# Patient Record
Sex: Female | Born: 1945 | Race: White | Hispanic: No | Marital: Married | Smoking: Never smoker
Health system: Southern US, Community
[De-identification: ages and names within clinical notes are randomized; demographics above are authoritative.]

## PROBLEM LIST (undated history)

## (undated) DIAGNOSIS — E785 Hyperlipidemia, unspecified: Secondary | ICD-10-CM

## (undated) DIAGNOSIS — D509 Iron deficiency anemia, unspecified: Secondary | ICD-10-CM

## (undated) DIAGNOSIS — F329 Major depressive disorder, single episode, unspecified: Secondary | ICD-10-CM

## (undated) DIAGNOSIS — C569 Malignant neoplasm of unspecified ovary: Secondary | ICD-10-CM

## (undated) DIAGNOSIS — C55 Malignant neoplasm of uterus, part unspecified: Secondary | ICD-10-CM

## (undated) DIAGNOSIS — F32A Depression, unspecified: Secondary | ICD-10-CM

## (undated) DIAGNOSIS — M810 Age-related osteoporosis without current pathological fracture: Secondary | ICD-10-CM

## (undated) DIAGNOSIS — E559 Vitamin D deficiency, unspecified: Secondary | ICD-10-CM

## (undated) DIAGNOSIS — K648 Other hemorrhoids: Secondary | ICD-10-CM

## (undated) DIAGNOSIS — K219 Gastro-esophageal reflux disease without esophagitis: Secondary | ICD-10-CM

## (undated) DIAGNOSIS — M6289 Other specified disorders of muscle: Secondary | ICD-10-CM

## (undated) DIAGNOSIS — M109 Gout, unspecified: Secondary | ICD-10-CM

## (undated) DIAGNOSIS — IMO0002 Reserved for concepts with insufficient information to code with codable children: Secondary | ICD-10-CM

## (undated) DIAGNOSIS — R7303 Prediabetes: Secondary | ICD-10-CM

## (undated) DIAGNOSIS — C439 Malignant melanoma of skin, unspecified: Secondary | ICD-10-CM

## (undated) DIAGNOSIS — N63 Unspecified lump in unspecified breast: Secondary | ICD-10-CM

## (undated) DIAGNOSIS — R519 Headache, unspecified: Secondary | ICD-10-CM

## (undated) DIAGNOSIS — M1990 Unspecified osteoarthritis, unspecified site: Secondary | ICD-10-CM

## (undated) DIAGNOSIS — C44529 Squamous cell carcinoma of skin of other part of trunk: Secondary | ICD-10-CM

## (undated) DIAGNOSIS — G629 Polyneuropathy, unspecified: Secondary | ICD-10-CM

## (undated) DIAGNOSIS — K635 Polyp of colon: Secondary | ICD-10-CM

## (undated) DIAGNOSIS — G5600 Carpal tunnel syndrome, unspecified upper limb: Secondary | ICD-10-CM

## (undated) DIAGNOSIS — K6389 Other specified diseases of intestine: Secondary | ICD-10-CM

## (undated) DIAGNOSIS — R51 Headache: Secondary | ICD-10-CM

## (undated) DIAGNOSIS — R918 Other nonspecific abnormal finding of lung field: Secondary | ICD-10-CM

## (undated) DIAGNOSIS — M069 Rheumatoid arthritis, unspecified: Secondary | ICD-10-CM

## (undated) DIAGNOSIS — W19XXXA Unspecified fall, initial encounter: Secondary | ICD-10-CM

## (undated) DIAGNOSIS — K589 Irritable bowel syndrome without diarrhea: Secondary | ICD-10-CM

## (undated) DIAGNOSIS — I10 Essential (primary) hypertension: Secondary | ICD-10-CM

## (undated) DIAGNOSIS — N289 Disorder of kidney and ureter, unspecified: Secondary | ICD-10-CM

## (undated) HISTORY — PX: CHOLECYSTECTOMY: SHX55

## (undated) HISTORY — DX: Unspecified lump in unspecified breast: N63.0

## (undated) HISTORY — DX: Unspecified fall, initial encounter: W19.XXXA

## (undated) HISTORY — DX: Major depressive disorder, single episode, unspecified: F32.9

## (undated) HISTORY — PX: OPERATIVE HYSTEROSCOPY: SUR664

## (undated) HISTORY — PX: BREAST SURGERY: SHX581

## (undated) HISTORY — PX: ADENOIDECTOMY: SUR15

## (undated) HISTORY — PX: CARPAL TUNNEL RELEASE: SHX101

## (undated) HISTORY — DX: Polyp of colon: K63.5

## (undated) HISTORY — DX: Squamous cell carcinoma of skin of other part of trunk: C44.529

## (undated) HISTORY — DX: Age-related osteoporosis without current pathological fracture: M81.0

## (undated) HISTORY — DX: Gout, unspecified: M10.9

## (undated) HISTORY — DX: Polyneuropathy, unspecified: G62.9

## (undated) HISTORY — PX: MASTECTOMY, PARTIAL: SHX709

## (undated) HISTORY — PX: KNEE ARTHROPLASTY: SHX992

## (undated) HISTORY — DX: Carpal tunnel syndrome, unspecified upper limb: G56.00

## (undated) HISTORY — PX: APPENDECTOMY: SHX54

## (undated) HISTORY — DX: Gastro-esophageal reflux disease without esophagitis: K21.9

## (undated) HISTORY — DX: Prediabetes: R73.03

## (undated) HISTORY — DX: Other specified diseases of intestine: K63.89

## (undated) HISTORY — DX: Headache: R51

## (undated) HISTORY — DX: Depression, unspecified: F32.A

## (undated) HISTORY — DX: Vitamin D deficiency, unspecified: E55.9

## (undated) HISTORY — DX: Other hemorrhoids: K64.8

## (undated) HISTORY — DX: Iron deficiency anemia, unspecified: D50.9

## (undated) HISTORY — PX: CERVICAL SPINE SURGERY: SHX589

## (undated) HISTORY — DX: Headache, unspecified: R51.9

## (undated) HISTORY — PX: COLONOSCOPY: SHX174

## (undated) HISTORY — PX: LUMBAR DISC SURGERY: SHX700

## (undated) HISTORY — PX: MELANOMA EXCISION: SHX5266

## (undated) HISTORY — PX: ABDOMINAL HYSTERECTOMY: SHX81

## (undated) HISTORY — PX: TONSILLECTOMY: SUR1361

## (undated) HISTORY — DX: Other nonspecific abnormal finding of lung field: R91.8

## (undated) HISTORY — DX: Other specified disorders of muscle: M62.89

## (undated) HISTORY — DX: Irritable bowel syndrome, unspecified: K58.9

---

## 1975-02-23 DIAGNOSIS — C569 Malignant neoplasm of unspecified ovary: Secondary | ICD-10-CM | POA: Insufficient documentation

## 1988-02-23 DIAGNOSIS — D126 Benign neoplasm of colon, unspecified: Secondary | ICD-10-CM | POA: Insufficient documentation

## 2000-01-05 ENCOUNTER — Ambulatory Visit (HOSPITAL_COMMUNITY): Admission: RE | Admit: 2000-01-05 | Discharge: 2000-01-05 | Payer: Self-pay | Admitting: Neurosurgery

## 2000-03-12 ENCOUNTER — Ambulatory Visit (HOSPITAL_COMMUNITY): Admission: RE | Admit: 2000-03-12 | Discharge: 2000-03-12 | Payer: Self-pay | Admitting: Neurosurgery

## 2000-03-12 ENCOUNTER — Encounter: Payer: Self-pay | Admitting: Neurosurgery

## 2000-03-31 ENCOUNTER — Ambulatory Visit (HOSPITAL_COMMUNITY): Admission: RE | Admit: 2000-03-31 | Discharge: 2000-03-31 | Payer: Self-pay | Admitting: Neurosurgery

## 2000-03-31 ENCOUNTER — Encounter: Payer: Self-pay | Admitting: Neurosurgery

## 2000-04-14 ENCOUNTER — Encounter: Payer: Self-pay | Admitting: Neurosurgery

## 2000-04-14 ENCOUNTER — Ambulatory Visit (HOSPITAL_COMMUNITY): Admission: RE | Admit: 2000-04-14 | Discharge: 2000-04-14 | Payer: Self-pay | Admitting: Neurosurgery

## 2000-04-25 ENCOUNTER — Other Ambulatory Visit: Admission: RE | Admit: 2000-04-25 | Discharge: 2000-04-25 | Payer: Self-pay | Admitting: *Deleted

## 2000-04-28 ENCOUNTER — Ambulatory Visit (HOSPITAL_COMMUNITY): Admission: RE | Admit: 2000-04-28 | Discharge: 2000-04-28 | Payer: Self-pay | Admitting: Neurosurgery

## 2000-04-28 ENCOUNTER — Encounter: Payer: Self-pay | Admitting: Neurosurgery

## 2000-05-02 ENCOUNTER — Encounter: Admission: RE | Admit: 2000-05-02 | Discharge: 2000-05-02 | Payer: Self-pay | Admitting: *Deleted

## 2000-05-11 ENCOUNTER — Encounter: Admission: RE | Admit: 2000-05-11 | Discharge: 2000-05-11 | Payer: Self-pay | Admitting: *Deleted

## 2000-11-09 ENCOUNTER — Encounter: Admission: RE | Admit: 2000-11-09 | Discharge: 2000-11-09 | Payer: Self-pay | Admitting: Neurosurgery

## 2000-11-09 ENCOUNTER — Encounter: Payer: Self-pay | Admitting: Neurosurgery

## 2001-05-04 ENCOUNTER — Encounter: Payer: Self-pay | Admitting: Internal Medicine

## 2001-05-04 ENCOUNTER — Encounter: Admission: RE | Admit: 2001-05-04 | Discharge: 2001-05-04 | Payer: Self-pay | Admitting: Internal Medicine

## 2001-05-30 ENCOUNTER — Other Ambulatory Visit: Admission: RE | Admit: 2001-05-30 | Discharge: 2001-05-30 | Payer: Self-pay | Admitting: *Deleted

## 2001-06-13 ENCOUNTER — Inpatient Hospital Stay (HOSPITAL_COMMUNITY): Admission: RE | Admit: 2001-06-13 | Discharge: 2001-06-20 | Payer: Self-pay | Admitting: Neurosurgery

## 2001-06-13 ENCOUNTER — Encounter: Payer: Self-pay | Admitting: Neurosurgery

## 2001-06-17 ENCOUNTER — Encounter: Payer: Self-pay | Admitting: Neurosurgery

## 2001-08-29 ENCOUNTER — Encounter: Admission: RE | Admit: 2001-08-29 | Discharge: 2001-08-29 | Payer: Self-pay | Admitting: *Deleted

## 2001-10-30 ENCOUNTER — Emergency Department (HOSPITAL_COMMUNITY): Admission: EM | Admit: 2001-10-30 | Discharge: 2001-10-30 | Payer: Self-pay | Admitting: Emergency Medicine

## 2001-11-03 ENCOUNTER — Inpatient Hospital Stay (HOSPITAL_COMMUNITY): Admission: EM | Admit: 2001-11-03 | Discharge: 2001-11-08 | Payer: Self-pay

## 2001-11-07 ENCOUNTER — Encounter: Payer: Self-pay | Admitting: Family Medicine

## 2001-11-09 ENCOUNTER — Encounter: Payer: Self-pay | Admitting: Family Medicine

## 2001-11-16 ENCOUNTER — Encounter: Admission: RE | Admit: 2001-11-16 | Discharge: 2001-11-16 | Payer: Self-pay | Admitting: Family Medicine

## 2001-11-29 ENCOUNTER — Encounter: Admission: RE | Admit: 2001-11-29 | Discharge: 2001-11-29 | Payer: Self-pay | Admitting: Family Medicine

## 2001-12-13 ENCOUNTER — Encounter: Admission: RE | Admit: 2001-12-13 | Discharge: 2001-12-13 | Payer: Self-pay | Admitting: Family Medicine

## 2001-12-28 ENCOUNTER — Encounter: Admission: RE | Admit: 2001-12-28 | Discharge: 2001-12-28 | Payer: Self-pay | Admitting: Family Medicine

## 2002-04-02 ENCOUNTER — Encounter: Payer: Self-pay | Admitting: Neurosurgery

## 2002-04-02 ENCOUNTER — Encounter: Admission: RE | Admit: 2002-04-02 | Discharge: 2002-04-02 | Payer: Self-pay | Admitting: Neurosurgery

## 2002-04-06 ENCOUNTER — Encounter: Admission: RE | Admit: 2002-04-06 | Discharge: 2002-04-06 | Payer: Self-pay | Admitting: Family Medicine

## 2002-04-17 ENCOUNTER — Encounter: Payer: Self-pay | Admitting: Neurosurgery

## 2002-04-17 ENCOUNTER — Encounter: Admission: RE | Admit: 2002-04-17 | Discharge: 2002-04-17 | Payer: Self-pay | Admitting: Neurosurgery

## 2002-05-01 ENCOUNTER — Encounter: Payer: Self-pay | Admitting: Neurosurgery

## 2002-05-01 ENCOUNTER — Encounter: Admission: RE | Admit: 2002-05-01 | Discharge: 2002-05-01 | Payer: Self-pay | Admitting: Neurosurgery

## 2002-05-07 ENCOUNTER — Encounter: Admission: RE | Admit: 2002-05-07 | Discharge: 2002-05-07 | Payer: Self-pay | Admitting: Internal Medicine

## 2002-05-07 ENCOUNTER — Encounter: Payer: Self-pay | Admitting: Internal Medicine

## 2002-05-10 ENCOUNTER — Encounter: Payer: Self-pay | Admitting: Internal Medicine

## 2002-05-10 ENCOUNTER — Encounter: Admission: RE | Admit: 2002-05-10 | Discharge: 2002-05-10 | Payer: Self-pay | Admitting: Internal Medicine

## 2002-05-16 ENCOUNTER — Encounter: Admission: RE | Admit: 2002-05-16 | Discharge: 2002-05-16 | Payer: Self-pay | Admitting: Family Medicine

## 2002-12-06 ENCOUNTER — Encounter: Payer: Self-pay | Admitting: Sports Medicine

## 2002-12-06 ENCOUNTER — Encounter: Admission: RE | Admit: 2002-12-06 | Discharge: 2002-12-06 | Payer: Self-pay | Admitting: Sports Medicine

## 2002-12-06 ENCOUNTER — Encounter: Payer: Self-pay | Admitting: Radiology

## 2002-12-25 ENCOUNTER — Encounter: Admission: RE | Admit: 2002-12-25 | Discharge: 2002-12-25 | Payer: Self-pay | Admitting: Sports Medicine

## 2003-01-09 ENCOUNTER — Encounter: Admission: RE | Admit: 2003-01-09 | Discharge: 2003-01-09 | Payer: Self-pay | Admitting: Sports Medicine

## 2003-04-01 ENCOUNTER — Encounter: Admission: RE | Admit: 2003-04-01 | Discharge: 2003-04-01 | Payer: Self-pay | Admitting: Psychiatry

## 2003-05-14 ENCOUNTER — Encounter: Admission: RE | Admit: 2003-05-14 | Discharge: 2003-05-14 | Payer: Self-pay | Admitting: *Deleted

## 2003-05-21 ENCOUNTER — Other Ambulatory Visit: Admission: RE | Admit: 2003-05-21 | Discharge: 2003-05-21 | Payer: Self-pay | Admitting: *Deleted

## 2003-10-22 ENCOUNTER — Encounter: Admission: RE | Admit: 2003-10-22 | Discharge: 2003-10-22 | Payer: Self-pay | Admitting: Neurosurgery

## 2004-01-10 ENCOUNTER — Encounter: Admission: RE | Admit: 2004-01-10 | Discharge: 2004-01-10 | Payer: Self-pay | Admitting: Rheumatology

## 2004-03-25 ENCOUNTER — Inpatient Hospital Stay (HOSPITAL_COMMUNITY): Admission: RE | Admit: 2004-03-25 | Discharge: 2004-03-27 | Payer: Self-pay | Admitting: Neurosurgery

## 2004-04-22 ENCOUNTER — Emergency Department (HOSPITAL_COMMUNITY): Admission: EM | Admit: 2004-04-22 | Discharge: 2004-04-23 | Payer: Self-pay | Admitting: Emergency Medicine

## 2004-07-22 ENCOUNTER — Encounter: Admission: RE | Admit: 2004-07-22 | Discharge: 2004-07-22 | Payer: Self-pay | Admitting: Internal Medicine

## 2004-09-14 ENCOUNTER — Ambulatory Visit (HOSPITAL_COMMUNITY): Admission: RE | Admit: 2004-09-14 | Discharge: 2004-09-14 | Payer: Self-pay | Admitting: Gastroenterology

## 2004-10-01 ENCOUNTER — Ambulatory Visit (HOSPITAL_COMMUNITY): Admission: RE | Admit: 2004-10-01 | Discharge: 2004-10-01 | Payer: Self-pay | Admitting: Neurosurgery

## 2004-10-22 ENCOUNTER — Ambulatory Visit: Payer: Self-pay | Admitting: Internal Medicine

## 2004-11-07 ENCOUNTER — Ambulatory Visit (HOSPITAL_COMMUNITY): Admission: RE | Admit: 2004-11-07 | Discharge: 2004-11-07 | Payer: Self-pay | Admitting: Neurosurgery

## 2004-11-18 ENCOUNTER — Ambulatory Visit: Payer: Self-pay | Admitting: Internal Medicine

## 2004-11-25 ENCOUNTER — Ambulatory Visit: Payer: Self-pay | Admitting: Internal Medicine

## 2004-12-23 ENCOUNTER — Ambulatory Visit: Payer: Self-pay | Admitting: Internal Medicine

## 2005-01-11 ENCOUNTER — Ambulatory Visit: Payer: Self-pay | Admitting: Internal Medicine

## 2005-05-07 ENCOUNTER — Ambulatory Visit (HOSPITAL_COMMUNITY): Admission: RE | Admit: 2005-05-07 | Discharge: 2005-05-07 | Payer: Self-pay | Admitting: Neurosurgery

## 2005-05-07 ENCOUNTER — Encounter (INDEPENDENT_AMBULATORY_CARE_PROVIDER_SITE_OTHER): Payer: Self-pay | Admitting: *Deleted

## 2005-10-20 ENCOUNTER — Encounter: Admission: RE | Admit: 2005-10-20 | Discharge: 2005-10-20 | Payer: Self-pay | Admitting: Neurosurgery

## 2006-09-19 ENCOUNTER — Encounter: Admission: RE | Admit: 2006-09-19 | Discharge: 2006-09-19 | Payer: Self-pay | Admitting: Neurosurgery

## 2006-11-02 ENCOUNTER — Encounter: Admission: RE | Admit: 2006-11-02 | Discharge: 2006-11-02 | Payer: Self-pay | Admitting: Internal Medicine

## 2006-11-17 ENCOUNTER — Ambulatory Visit (HOSPITAL_COMMUNITY): Admission: RE | Admit: 2006-11-17 | Discharge: 2006-11-17 | Payer: Self-pay | Admitting: Gastroenterology

## 2007-02-07 ENCOUNTER — Encounter: Admission: RE | Admit: 2007-02-07 | Discharge: 2007-02-07 | Payer: Self-pay | Admitting: Gastroenterology

## 2007-02-22 ENCOUNTER — Encounter: Admission: RE | Admit: 2007-02-22 | Discharge: 2007-02-22 | Payer: Self-pay | Admitting: Gastroenterology

## 2007-05-18 ENCOUNTER — Encounter: Admission: RE | Admit: 2007-05-18 | Discharge: 2007-05-18 | Payer: Self-pay | Admitting: Neurosurgery

## 2007-12-13 ENCOUNTER — Encounter: Admission: RE | Admit: 2007-12-13 | Discharge: 2007-12-13 | Payer: Self-pay | Admitting: Internal Medicine

## 2008-04-08 ENCOUNTER — Encounter: Admission: RE | Admit: 2008-04-08 | Discharge: 2008-04-08 | Payer: Self-pay | Admitting: Neurosurgery

## 2008-07-13 ENCOUNTER — Encounter
Admission: RE | Admit: 2008-07-13 | Discharge: 2008-07-13 | Payer: Self-pay | Admitting: Physical Medicine and Rehabilitation

## 2008-07-30 ENCOUNTER — Ambulatory Visit (HOSPITAL_COMMUNITY)
Admission: RE | Admit: 2008-07-30 | Discharge: 2008-07-30 | Payer: Self-pay | Admitting: Physical Medicine and Rehabilitation

## 2008-10-04 ENCOUNTER — Emergency Department (HOSPITAL_BASED_OUTPATIENT_CLINIC_OR_DEPARTMENT_OTHER): Admission: EM | Admit: 2008-10-04 | Discharge: 2008-10-04 | Payer: Self-pay | Admitting: Emergency Medicine

## 2008-10-17 ENCOUNTER — Encounter: Admission: RE | Admit: 2008-10-17 | Discharge: 2008-10-17 | Payer: Self-pay | Admitting: Dermatology

## 2008-10-22 ENCOUNTER — Emergency Department (HOSPITAL_COMMUNITY): Admission: EM | Admit: 2008-10-22 | Discharge: 2008-10-23 | Payer: Self-pay | Admitting: Emergency Medicine

## 2008-11-10 ENCOUNTER — Emergency Department (HOSPITAL_COMMUNITY): Admission: EM | Admit: 2008-11-10 | Discharge: 2008-11-11 | Payer: Self-pay | Admitting: Emergency Medicine

## 2009-04-29 ENCOUNTER — Encounter: Admission: RE | Admit: 2009-04-29 | Discharge: 2009-04-29 | Payer: Self-pay | Admitting: Specialist

## 2009-08-06 ENCOUNTER — Encounter: Admission: RE | Admit: 2009-08-06 | Discharge: 2009-08-06 | Payer: Self-pay | Admitting: Neurosurgery

## 2009-08-26 ENCOUNTER — Encounter: Admission: RE | Admit: 2009-08-26 | Discharge: 2009-08-26 | Payer: Self-pay | Admitting: Neurosurgery

## 2009-09-25 ENCOUNTER — Encounter: Admission: RE | Admit: 2009-09-25 | Discharge: 2009-09-25 | Payer: Self-pay | Admitting: Hematology and Oncology

## 2010-03-14 ENCOUNTER — Encounter: Payer: Self-pay | Admitting: Internal Medicine

## 2010-03-14 ENCOUNTER — Encounter: Payer: Self-pay | Admitting: Neurosurgery

## 2010-03-15 ENCOUNTER — Encounter: Payer: Self-pay | Admitting: Internal Medicine

## 2010-03-15 ENCOUNTER — Encounter: Payer: Self-pay | Admitting: Obstetrics and Gynecology

## 2010-03-15 ENCOUNTER — Encounter: Payer: Self-pay | Admitting: Neurosurgery

## 2010-03-16 ENCOUNTER — Encounter: Payer: Self-pay | Admitting: Internal Medicine

## 2010-05-29 LAB — COMPREHENSIVE METABOLIC PANEL
AST: 32 U/L (ref 0–37)
Albumin: 4 g/dL (ref 3.5–5.2)
Albumin: 3.4 g/dL — ABNORMAL LOW (ref 3.5–5.2)
Alkaline Phosphatase: 58 U/L (ref 39–117)
BUN: 6 mg/dL (ref 6–23)
BUN: 8 mg/dL (ref 6–23)
CO2: 23 mEq/L (ref 19–32)
Calcium: 8.9 mg/dL (ref 8.4–10.5)
Calcium: 9.8 mg/dL (ref 8.4–10.5)
Chloride: 109 mEq/L (ref 96–112)
Creatinine, Ser: 1.71 mg/dL — ABNORMAL HIGH (ref 0.4–1.2)
Glucose, Bld: 127 mg/dL — ABNORMAL HIGH (ref 70–99)
Glucose, Bld: 125 mg/dL — ABNORMAL HIGH (ref 70–99)
Potassium: 3.4 mEq/L — ABNORMAL LOW (ref 3.5–5.1)
Potassium: 4.1 mEq/L (ref 3.5–5.1)
Total Protein: 7.5 g/dL (ref 6.0–8.3)
Total Protein: 6.2 g/dL (ref 6.0–8.3)

## 2010-05-29 LAB — DIFFERENTIAL
Basophils Absolute: 0.1 10*3/uL (ref 0.0–0.1)
Basophils Relative: 1 % (ref 0–1)
Lymphocytes Relative: 44 % (ref 12–46)
Lymphocytes Relative: 49 % — ABNORMAL HIGH (ref 12–46)
Lymphs Abs: 4 10*3/uL (ref 0.7–4.0)
Monocytes Absolute: 1.1 10*3/uL — ABNORMAL HIGH (ref 0.1–1.0)
Monocytes Absolute: 1.2 10*3/uL — ABNORMAL HIGH (ref 0.1–1.0)
Monocytes Relative: 10 % (ref 3–12)
Monocytes Relative: 15 % — ABNORMAL HIGH (ref 3–12)
Neutro Abs: 4.3 10*3/uL (ref 1.7–7.7)
Neutro Abs: 2.8 10*3/uL (ref 1.7–7.7)
Neutrophils Relative %: 42 % — ABNORMAL LOW (ref 43–77)
Neutrophils Relative %: 34 % — ABNORMAL LOW (ref 43–77)

## 2010-05-29 LAB — CBC
HCT: 31.8 % — ABNORMAL LOW (ref 36.0–46.0)
HCT: 31.2 % — ABNORMAL LOW (ref 36.0–46.0)
Hemoglobin: 10.6 g/dL — ABNORMAL LOW (ref 12.0–15.0)
MCHC: 33.9 g/dL (ref 30.0–36.0)
Platelets: 316 10*3/uL (ref 150–400)
Platelets: 288 10*3/uL (ref 150–400)
RBC: 3.46 MIL/uL — ABNORMAL LOW (ref 3.87–5.11)
RDW: 15.6 % — ABNORMAL HIGH (ref 11.5–15.5)

## 2010-05-29 LAB — URINALYSIS, ROUTINE W REFLEX MICROSCOPIC
Bilirubin Urine: NEGATIVE
Hgb urine dipstick: NEGATIVE
Hgb urine dipstick: NEGATIVE
Nitrite: NEGATIVE
Protein, ur: NEGATIVE mg/dL
Specific Gravity, Urine: 1.007 (ref 1.005–1.030)
Specific Gravity, Urine: 1.006 (ref 1.005–1.030)
Urobilinogen, UA: 0.2 mg/dL (ref 0.0–1.0)
Urobilinogen, UA: 0.2 mg/dL (ref 0.0–1.0)
pH: 7.5 (ref 5.0–8.0)

## 2010-05-29 LAB — PROTIME-INR: INR: 0.9 (ref 0.00–1.49)

## 2010-05-29 LAB — POCT CARDIAC MARKERS: Troponin i, poc: <0.05 ng/mL (ref 0.00–0.09)

## 2010-05-29 LAB — APTT: aPTT: 26 seconds (ref 24–37)

## 2010-06-05 ENCOUNTER — Other Ambulatory Visit: Payer: Self-pay | Admitting: Neurosurgery

## 2010-06-05 DIAGNOSIS — M47816 Spondylosis without myelopathy or radiculopathy, lumbar region: Secondary | ICD-10-CM

## 2010-06-09 ENCOUNTER — Other Ambulatory Visit: Payer: Self-pay

## 2010-06-11 ENCOUNTER — Ambulatory Visit
Admission: RE | Admit: 2010-06-11 | Discharge: 2010-06-11 | Disposition: A | Discharge status: Home / Self Care | Payer: Medicare Other | Source: Ambulatory Visit | Attending: Neurosurgery | Admitting: Neurosurgery

## 2010-06-11 DIAGNOSIS — M47816 Spondylosis without myelopathy or radiculopathy, lumbar region: Secondary | ICD-10-CM

## 2010-07-03 ENCOUNTER — Other Ambulatory Visit: Payer: Self-pay | Admitting: Neurosurgery

## 2010-07-03 DIAGNOSIS — M47816 Spondylosis without myelopathy or radiculopathy, lumbar region: Secondary | ICD-10-CM

## 2010-07-06 ENCOUNTER — Other Ambulatory Visit: Payer: Medicare Other

## 2010-07-09 ENCOUNTER — Inpatient Hospital Stay: Admission: RE | Admit: 2010-07-09 | Payer: Medicare Other | Source: Ambulatory Visit

## 2010-07-10 NOTE — H&P (Signed)
[place]. [facility]  Patient:    Kristina Patel, Kristina Patel Visit Number: [id number] MRN: [id number]          Service Type: SUR Location: 3000 3019 01 Attending Physician:  Emeterio Reeve Dictated by:   Payton Doughty, M.D. 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  SERVICE:  Neurosurgery.  HISTORY OF PRESENT ILLNESS:  A 65 year old right-handed white lady with a long history of back pain.  She has been doing all right and then about two years ago began having more pain and pain in her back.  She also had some pain in her hands; she underwent a carpal tunnel and did well from that.  She has had progressive increasing pain in her back with non-radicular symptoms.  She has undergone an MRI, underwent epidural steroids to no avail and then underwent discography at 2-3, 3-4 and 4-5 and had a strongly concordant positive response at L2-3; she is therefore admitted for an L2-3 fusion.  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  She has had several lipomas removed from her breasts, the last one in 1999.  ALLERGIES:  She is sensitive to [facility], PENICILLIN, DARVON and CODEINE.  She has taken Keflex with no ill effects.  SOCIAL HISTORY:  She does not smoke, drinks on a social basis and has her own weight-loss business.  FAMILY HISTORY:  Mom was 74, in poor health with Alzheimers, daddy was 23, in poor health with non-Hodgkins lymphoma; both have recently been deceased.  REVIEW OF SYSTEMS:  Remarkable for sinus problems, headaches, wearing glasses, back pain, arm pain, leg pain, joint pain, arthritis and neck pain.  PHYSICAL EXAMINATION:  HEENT:  Within normal limits.  NECK:  She has reasonable range of motion of her neck and some neck  pain with movement.  CHEST:  Clear.  CARDIAC:  Regular rate and rhythm.  ABDOMEN:  Nontender with no hepatosplenomegaly.  EXTREMITIES:  Without clubbing or cyanosis.  GU:  Deferred.  PERIPHERAL PULSES:  Good.  NEUROLOGIC:  She is awake, alert and oriented.  Her cranial nerves are intact. Motor exam shows 5/5 strength throughout the upper and lower extremities at this time.  There is no current sensory deficit.  Reflexes are 2 at the knees, 1 at the ankles.  Toes are downgoing bilaterally.  Straight leg raise is negative.  Back movement has a great deal of pain associated with it.  LABORATORY AND ACCESSORY DATA:  Discography results have been reviewed above.  CLINICAL IMPRESSION:  Lumbar spondylosis with intractable back pain related to the L2-3 level.  PLAN:  The plan is for a lumbar laminectomy, diskectomy and posterior lumbar interbody fusion with Ray threaded fusion cage.  The risks and benefits of this approach have been discussed with her and she wishes to proceed. Dictated by:   Payton Doughty, M.D. Attending Physician:  Emeterio Reeve DD:  06/13/01 TD:  06/13/01 Job: [id number]

## 2010-07-10 NOTE — Discharge Summary (Signed)
[place]. [facility]  Patient:    Kristina Patel, Kristina Patel Visit Number: [id number] MRN: [id number]          Service Type: SUR Location: 3000 3019 01 Attending Physician:  Emeterio Reeve Dictated by:   Payton Doughty, M.D. Admit Date:  06/13/2001 Discharge Date: 06/20/2001                             Discharge Summary  ADMISSION DIAGNOSIS:  Spondylosis L2-L3.  DISCHARGE DIAGNOSIS:  Spondylosis L2-L3.  PROCEDURE:  L2-L3 laminectomy and discectomy, posterior lumbar interbody fusion with fusion cage.  SURGEON:  Payton Doughty, M.D.  COMPLICATIONS:  None.  HISTORY OF PRESENT ILLNESS:  The patient is a 65 year old right handed white lady whose history and physical is recounted in the chart.  She has had back pain for a while and underwent discography and was concordant to L2-L3.  She was admitted for fusion.  Her medical history is remarkable for appendectomy, tonsillectomy.  She has had lipomas removed from her breast which were benign.  ALLERGIES:  FIORINAL, PENICILLIN, DARVON, CODEINE.  PHYSICAL EXAMINATION: General examination unremarkable.  NEUROLOGICAL EXAMINATION:  Intact with intractable back pain and pain in her right L3 distribution.  HOSPITAL COURSE:  The patient was admitted after ascertaining normal laboratory values and underwent an L2-L3 laminectomy, discectomy, posterior lumbar interbody fusion.  Postoperatively she has done well.  She had obviously some incisional pain.  She was mobilized with physical therapy.  She had one bout of pain after Toradol was stopped, starting Celebrex relieved this.  Placing her on Neurontin relieved her neuropathic pain she was experiencing.  She is now up eating and voiding normally and pain is controlled with oral medications.  She is being discharged home to the care of her family.  Her follow up will be in the [facility] office in one week for suture removal. Dictated by:   Payton Doughty, M.D. Attending Physician:  Emeterio Reeve DD:  06/20/01 TD:  06/20/01 Job: [id number]

## 2010-07-10 NOTE — H&P (Signed)
NAME:  Kristina Patel, Kristina Patel                        ACCOUNT NO.:  [id number]   MEDICAL RECORD NO.:  [id number]                   PATIENT TYPE:  INP   LOCATION:  5712                                 FACILITY:  [facility]   PHYSICIAN:  Camille L. Mardelle Matte, MD                 DATE OF BIRTH:  [birth year]   DATE OF ADMISSION:  11/02/2001  DATE OF DISCHARGE:                                HISTORY & PHYSICAL   INTERN:  Barney Drain, MD   HISTORY OF PRESENT ILLNESS:  The patient is a 65 year old female with a  history of chronic back pain who presents with an eight-day history of  abdominal pain.  The patient initially presented to the emergency department  four days prior to admission with symptoms of a UTI in addition to her  abdominal pain.  She was given Levaquin and discharged home.  Her urinary  symptoms resolved, but her abdominal pain has increased over the past four  days and the patient began having fever, nausea and vomiting as well as  diarrhea for the past two days.  The patient describes the pain as right  lower quadrant and left lower quadrant, constant, nonradiating with no  exacerbations and no alleviating factors, and no radiations.   REVIEW OF SYSTEMS:  As above and the patient denies chest pain.  Denies  shortness of breath.  Denies hair or skin changes.  Denies easy bruising.   PAST MEDICAL HISTORY:  Previous medical history includes chronic back pain  and melanoma.   PAST SURGICAL HISTORY:  Previous surgical history includes L3, L4 fusion in  2003 April.  Melanoma removal in February 2002.  Appendectomy in 1996 and  total abdominal hysterectomy in 1975.   MEDICATIONS:  Levaquin, hydrocodone 10/600, Aciphex 20 mg q.d., Neurontin,  Flexeril, and Lexapril.   ALLERGIES:  PENICILLIN, DARVON, ULTRAM, CODEINE, and FIORINAL.   SOCIAL HISTORY:  The patient is self-employed as a weight loss counselor.  Denies tobacco use.  States occasionally drinks alcohol.   FAMILY HISTORY:  Her  mother had Alzheimer's.  Her father had non-Hodgkin's  lymphoma; both are presently deceased.   PHYSICAL EXAMINATION:   VITAL SIGNS:  Temperature is 98, respiratory rate 18, heart rate 117, blood  pressure is 119/95, and O2 sat is 96% on room air.   GENERAL:  This is a well-developed, well-nourished female in mild distress.  Alert and oriented times three.   HEENT:  Atraumatic and normocephalic.  PERLA.  EOMI.  Nares patent.  Throat  is nonerythematous.  Mucous membranes are moist.   NECK:  Supple with no masses.  No lymphadenopathy.  No thyromegaly.   CARDIOVASCULAR:  S1 and S2 positive.  Regular rate and rhythm.  No murmurs,  rubs or gallops.   LUNGS:  Clear to auscultation bilaterally.   ABDOMEN:  Bowel sounds positive.  Soft, tender to right and left lower  quadrants.  There is  a right para-midline scar and a Pfannenstiel incision  scar.   EXTREMITIES:  Full range of motion times four.  No cyanosis, clubbing or  edema.   NEUROLOGIC:  Cranial nerves II-XII grossly intact.  Sensation is intact.  Reflexes 2+ bilaterally at biceps, triceps, brachioradialis, patella, and  ankles.   LABORATORY DATA:  Labs from [facility]:  white count 12.1,  hemoglobin 11.9, hematocrit 32.8 and platelet count 458,000.  UA glucose was  100, specific gravity 1.005, no white blood cells, positive nitrites,  negative leukocytes esterase, and pH 5.5.  In the ED sodium was 141,  potassium 3.6, chloride 109, bicarb 28, BUN 11, creatinine 1.0, and glucose  125.  Total protein 7.3, albumin 2.9, AST 26, ALT 59, alk phos 404, and  total bili 0.9.  CT of her abdomen showed minimal free fluid in the pelvis,  a few diverticula, but nothing that could be responsible for her pain.   ASSESSMENT/PLAN:  This is a 65 year old female with abdominal pain of  unknown etiology.  CT showed no obvious source and given time course any  diverticulitis, septal perforation or abscess should been visible at this   point.  However, given symptomatology we will treat the patient as  diverticulitis empirically with IV antibiotics and pain medication.  If no  improvement by morning I will consider a GI consult or surgery consult.          Maylon Peppers Waynette Buttery, MD                       Malachi Bonds. Mardelle Matte, MD    SAG/MEDQ  D:  11/03/2001  T:  11/04/2001  Job:  [id number]   cc:   Dr. Richrd Humbles, [place]

## 2010-07-10 NOTE — Op Note (Signed)
NAMEWESTLYN, GLAZA NO.:  [id number]   MEDICAL RECORD NO.:  [id number]          PATIENT TYPE:  OIB   LOCATION:  3037                         FACILITY:  [facility]   PHYSICIAN:  Reinaldo Meeker, M.D. DATE OF BIRTH:  [birth year]   DATE OF PROCEDURE:  05/07/2005  DATE OF DISCHARGE:  05/07/2005                                 OPERATIVE REPORT   PREOPERATIVE DIAGNOSIS:  Compression fracture of T12.   POSTOPERATIVE DIAGNOSIS:  Compression fracture of T12.   PROCEDURE:  1.  T12 vertebroplasty.  2.  Bone biopsy.   SURGEON:  Reinaldo Meeker, M.D.   PROCEDURE IN DETAIL:  After being placed in the prone position, the  patient's back was prepped and draped in the usual sterile fashion.  AP and  lateral fluoroscopy were used to identify good entry points laterally so it  was superior to the T12 pedicle on the left.  A small stab wound incision  was then made and the trocar needle passed from a superior-to-inferior and  lateral-to-medial direction down through the pedicle of T12 and into the  vertebral body.  Once in place, bone biopsy was performed and sent for  specimen due to history of rheumatoid arthritis.  At this time, bone cement  was used to fill the vertebral body and good fill bilaterally, particularly  along the superior endplate where the fracture was present, was obtained.  When an adequate amount of bone cement had been placed, the trocar was  slowly withdrawn and the cement tapped down.  The trocar was then removed  the rest of the way.  Three small staples were placed on the incision and a  sterile dressing was then applied.  The patient was extubated and taken to  the recovery room in stable condition.           ______________________________  Reinaldo Meeker, M.D.     ROK/MEDQ  D:  05/07/2005  T:  05/08/2005  Job:  [id number]

## 2010-07-10 NOTE — Op Note (Signed)
[place]. [facility]  Patient:    Kristina Patel, Kristina Patel Visit Number: [id number] MRN: [id number]          Service Type: SUR Location: 3000 3019 01 Attending Physician:  Emeterio Reeve Dictated by:   Payton Doughty, M.D. Proc. Date: 06/13/01 Admit Date:  06/13/2001                             Operative Report  PREOPERATIVE DIAGNOSIS:  Spondylosis L2-3.  POSTOPERATIVE DIAGNOSIS:  Spondylosis L2-3.  OPERATION PERFORMED:  L2-3 laminectomy and diskectomy and posterior lumbar interbody fusion with a Ray threaded fusion cage.  SURGEON:  Payton Doughty, M.D.  ANESTHESIA:  General endotracheal.  PREP:  Sterile Betadine prep and scrub with alcohol wipe.  COMPLICATIONS:  None.  INDICATIONS FOR PROCEDURE:  This is a 65 year old right-handed white lady with severe lumbar spondylosis at L2-3.  DESCRIPTION OF PROCEDURE:  The patient was taken to the operating room, smoothly anesthetized and intubated, and placed prone on the operating table. Following shave, prep and drape in the usual sterile fashion the skin was infiltrated with 1% Lidocaine 1:400,000 epinephrine.  X-ray was taken with marker in place and incision was made from the top of L2 to the bottom of  L3 and the lamina of L2 was exposed bilaterally in the subperiosteal plane out over the 2-3 facet joint.  The pars interarticularis lamina and inferior facet of L2 were removed as well as the superior facet of L3. The ligamentum flavum was removed and the right and left L2 roots dissected free as well as the right and left L3 roots dissected free.  Bleeding was controlled with a bipolar cautery exposing the disk space.  Having completed decompression of the nerve roots, diskectomy was carried out at L2-3 completely.  Ray threaded fusion cages were then placed 14 x 21 mm while protecting both the 2 and the 3 roots.  Intraoperative x-ray showed good placement of cages.  They were packed with bone graft  harvested from facet joints and capped.  The wound was irrigated and hemostasis assured.  The fascia was reapproximated with 0 Vicryl in interrupted fashion.  Subcutaneous tissues were approximated with 0 Vicryl in interrupted fashion.  Subcuticular tissues were reapproximated with 3-0 Vicryl in interrupted fashion.  The skin was closed with 3-0 nylon in a running locked fashion.  Betadine Telfa dressing was applied and made occlusive with Op-Site.  The patient was transferred to the recovery room in good condition. Dictated by:   Payton Doughty, M.D. Attending Physician:  Emeterio Reeve DD:  06/13/01 TD:  06/13/01 Job: [id number]

## 2010-07-10 NOTE — Discharge Summary (Signed)
NAME:  TANGANIKA, BARRADAS                        ACCOUNT NO.:  [id number]   MEDICAL RECORD NO.:  [id number]                   PATIENT TYPE:  INP   LOCATION:  5712                                 FACILITY:  [facility]   PHYSICIAN:  Leighton Roach McDiarmid, M.D.             DATE OF BIRTH:  1945-03-08   DATE OF ADMISSION:  11/02/2001  DATE OF DISCHARGE:  11/07/2001                                 DISCHARGE SUMMARY   DISCHARGE DIAGNOSES:  1. Nausea and vomiting, diarrhea, abdominal pain likely of viral etiology.  2. Elevated erythrocyte sedimentation rate, unclear etiology.  3. Elevated alkaline phosphatase which gave a hepatic origin, but of unclear     etiology.  4. Chronic low back pain.  5. History of melanoma, post resection.   DISCHARGE MEDICATIONS:  1. Phenergan 25 mg p.o. and suppositories p.r. q.4h. p.r.n. for nausea and     vomiting.  2. Continue on outpatient medicines that she was admitted with which     include: Lexapro 10 mg p.o. q.d., Aciphex 20 mg p.o. q.d., Neurontin 300     mg p.o. q.8h. or as previously prescribed, Flexeril as previously     prescribed, and hydrocodone as previously prescribed.   DISCHARGE INSTRUCTIONS:  The patient was instructed to eat small, frequent  meals.  She was encouraged to drink liquids if unable to tolerate solid  foods.  She was instructed to please make a follow-up appointment with Dr.  Michae Kava at [facility] in one to three weeks.  Phone number  was provided which would be [phone], and was also instructed to keep the  previously scheduled oncology appointment with Dr. Abbe Amsterdam which she said  was on November 14, 2001.   HOSPITAL COURSE:  The patient is a 65 year old female with a history of  chronic back pain and melanoma who presented with an 8-day history of  abdominal pain.  It was noted that she had been previously treated for a  urinary tract infection with Levaquin.  She is status post appendectomy and  TAH.  She was  afebrile and had right and left lower quadrant tenderness.  In  the emergency room, notable white blood cell count was 12.1.  Alkaline  phosphatase was 404, AST 26, ALT 59, total protein 7.3.  A CT done in the  emergency room showed cholelithiasis and atheromatous changes in the  abdominal aorta.  Small amount of free pelvic fluid, but as well as  surgically missing appendix and uterus.  There were a few scattered  diverticuli noticed in the distal, descending, and sigmoid portions of the  colon without evidence of diverticulitis.  Despite the fact that the CT was  negative, the patient was admitted and treated as though she had  diverticulitis due to clinical suspicion.   She was started on Cipro 400 mg IV q.12h., metronidazole 500 mg IV q.6h., as  well as morphine for pain control.  Throughout the hospital course, the  patient slowly improved symptomatically and clinically.  Labs that were done  to try and delineate a specific etiology were not diagnostic.  Stool  cultures were negative for [place], Salmonella, Shigella, and  Campylobacter.  A C.difficile was negative.  Occult blood was negative.   On November 06, 2001, a GI consult was obtained.  On her evaluation, they  felt that her symptoms were likely consistent with an acute infectious  gastroenteritis following treatment for the urinary tract infection.  However, they expressed concern with regards to the increased alkaline  phosphatase.  A fractionated phosphorus was obtained and showed to be  favorable of hepatic origin (total 264, stable 113, percent thermostable  43%)  Residual activity greater than 25% previous hepatic origin.  Immediately prior to discharge, a mitochondrial antibody was drawn to rule  out primary biliary cirrhosis, however, the results are pending.  A CT done  on admission showed unremarkable liver.   A sed rate was obtained on September 14.  It was 105.  The elevated sed rate  and elevated alkaline  phosphatase are concerning for a possible recurrence  of her melanoma.  The fact that the alkaline phosphatase is elevated,  suggests hepatic origin is somewhat reassuring, although, possibly makes the  picture a little more confusing.  At discharge, the patient was tolerating  some small solid foods, her pain was tolerable.  She was discharged home in  good condition with arrangements for follow-up with oncology and with  primary care.     Douglass Rivers, MD                        Etta Grandchild, M.D.    CH/MEDQ  D:  11/07/2001  T:  11/08/2001  Job:  [id number]   cc:   Mathis Bud, [facility], [place]   Dr. Rosana Fret, Urgent Care   [facility]   Dr. Lowry Bowl, [place]. [place], [place]

## 2010-07-10 NOTE — Discharge Summary (Signed)
   NAME:  Kristina Patel, Kristina Patel                        ACCOUNT NO.:  [id number]   MEDICAL RECORD NO.:  [id number]                   PATIENT TYPE:  INP   LOCATION:  5712                                 FACILITY:  [facility]   PHYSICIAN:  Douglass Rivers, MD                  DATE OF BIRTH:  1945/11/12   DATE OF ADMISSION:  11/02/2001  DATE OF DISCHARGE:  11/08/2001                                 DISCHARGE SUMMARY   ADDENDUM:  In addition to the previous diagnoses, additional diagnosis at  discharge are cholelithiasis with biliary colic.   Immediately prior to being discharged, the patient developed acute right  upper quadrant pain that was exquisitely tender.  The pain began to occur  shortly after she ate lunch.  She describes it as sharp and cramping.  On  exam, she had exquisite right upper quadrant tenderness.  Positive Murphy's  sign.  No rebound tenderness.   An EKG was done and was found to be normal sinus rhythm with no ST changes.  As the clinical picture was suspicious for cholecystitis, liver function  enzymes were drawn and found to be moderately elevated with an AST of 68,  ALT of 49, and alkaline phosphatase of 389.  Bilirubin was normal.  Lipase  was slightly elevated at 58.  Dr. Marina Goodell from GI was available and his  immediate impression was this was consistent with biliary colic and possibly  acute cholecystitis.  A surgical consult was sought.   An ultrasound done later that evening revealed a gallstone within the  gallbladder measuring 2.5 cm.  The gallstone was mobile.  There was no  evidence of gallbladder thickening, pericholecystic fluid, or sonographic  Murphy's sign.  The common bile duct was within normal limits at 5 mm.  The  next morning, a HIDA scan was done and showed a slightly delayed appearance  of the gallbladder; however, it did fill after one hour.  As there was no  indication for acute cholecystitis, the patient was advised to followup with  General Surgery in  approximately the next two to four weeks for the  possibility of arranging an elective laparoscopic cholecystectomy at some  time.  Otherwise discharge medicines and discharge planning are as  previously arranged.                                               Douglass Rivers, MD    CH/MEDQ  D:  11/09/2001  T:  11/13/2001  Job:  [id number]   cc:   [place] Daphine Deutscher, M.D.  Fax: [phone]

## 2010-07-10 NOTE — H&P (Signed)
Kristina Patel, Kristina Patel NO.:  [id number]   MEDICAL RECORD NO.:  [id number]          PATIENT TYPE:  INP   LOCATION:  NA                           FACILITY:  [facility]   PHYSICIAN:  Payton Doughty, M.D.      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   This is a 65 year old, right-handed white lady long history of back pain.  She underwent a fusion at L2-3 in 2003 and has done reasonably well for that  period.  She has had increasing neck pain.  MR demonstrates spondylosis at  C5-6, C6-7.  She has undergone traction, epidural steroids to no avail and  is now admitted for an anterior cervical decompression and fusion.   PAST MEDICAL HISTORY:  1.  Appendectomy in 1964.  2.  Tonsillectomy in 1958.  3.  Hysterectomy in 1975.  4.  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  5.  She has had lipomas removed from her breast, last one in 1999.   ALLERGIES:  FIORINAL, PENICILLIN, DARVON CODEINE.  She has taken Keflex with  no ill effects.   SOCIAL HISTORY:  She does not smoke.  Drinks on a social basis.  Has her own  weight loss business.   FAMILY HISTORY:  Mom 2 in poor health with Alzheimer's.  Dad age 21 in poor  health, __________ both died two years ago.   REVIEW OF SYSTEMS:  Remarkable for sinus problems, headaches, wearing  glasses, back pain, arm pain, leg pain, joint pain, neck pain, arthritis.   PHYSICAL EXAMINATION:  HEENT:  Normal limits.  NECK:  She has reasonable range of motion in her neck with neck pain with  movement.  CHEST:  Clear.  CARDIAC:  Regular rate and rhythm.  ABDOMEN:  Nontender without hepatosplenomegaly.  EXTREMITIES:  Without clubbing or cyanosis.  GENITOURINARY:  Deferred.  PERIPHERAL PULSES:  Good.  NEUROLOGIC:  She is awake, alert, and oriented.  Cranial nerves are intact.  Motor  examination shows 5/5 strength throughout the upper and lower  extremities.  There is no current sensory deficit.  Reflexes are 2 at the  knees, 1 at the ankles, 1 at the biceps, absent at the triceps, 1 at the  brachioradialis.  Hoffman's is negative.   MR demonstrates spondylosis at C5-6 and C6-7.   CLINICAL IMPRESSION:  Cervical spondylosis with intractable neck pain.   PLAN:  Anterior cervical decompression/fusion at C5-6 and C6-7.  The risks  and benefits of this approach have been discussed with her and she wished to  proceed.      MWR/MEDQ  D:  03/25/2004  T:  03/25/2004  Job:  [id number]

## 2010-07-10 NOTE — Op Note (Signed)
Kristina Patel, Kristina Patel NO.:  [id number]   MEDICAL RECORD NO.:  [id number]          PATIENT TYPE:  INP   LOCATION:  2873                         FACILITY:  [facility]   PHYSICIAN:  Payton Doughty, M.D.      DATE OF BIRTH:  February 04, 1946   DATE OF PROCEDURE:  03/25/2004  DATE OF DISCHARGE:                                 OPERATIVE REPORT   PREOPERATIVE DIAGNOSIS:  Cervical spondylosis C5-6 and C6-7.   POSTOPERATIVE DIAGNOSIS:  Cervical spondylosis C5-6 and C6-7.   OPERATIVE PROCEDURE:  C5-6, C6-7 anterior cervical decompression and fusion.   SURGEON:  Payton Doughty, M.D.   ANESTHESIA:  General endotracheal.   __________ .   COMPLICATIONS:  None.   DRAIN:  A JP.   ASSISTANT:  [place].   DOCTOR ASSISTANT:  Stefani Dama, M.D.   BODY OF TEXT:  This is a 65 year old lady with cervical spondylosis at C5-6  and C6-7.  She was taken to the operating room, intubated, placed supine on  the operating table.  Following shave, prep, and drape in the usual sterile  fashion, skin was incised in the midline __________  sternocleidomastoid  muscle at the level of the carotid tubercle.  Platysma was identified,  elevated, divided, and undermined.  Sternocleidomastoid was identified.  Manual dissection revealed the carotid artery tracked laterally to the left.  Trachea __________  was retracted laterally to the right, exposing the bones  of the anterior cervical spine.  Marker was placed, and intraoperative x-ray  obtained and confirmed correctness of level.  Having confirmed correctness  of level, the longus coli was taken down bilaterally.  The shadow line  retractor was placed.  Dissection was carried out at C5-6 and C6-7 under  gross observation.  The bone was found to be extremely soft.  It was not  really feasible to use the endplate disc space spreader, so the Caspar pins  were placed in C6 and C7, and the bone was very gently distracted.  The  osteophyte and remaining  disc was removed without difficulty under  microscopic observation.  A 7 mm bone graft was fashion from talar allograft  and tapped into place.  Upon withdrawal of the pins, it was found to have  walked through the bone a bit, and just palpation of the bone revealed it  was very soft.  At C5-6, the endplate disc space spreader was used very  gently, and this allowed access to disc space removal of the remaining disc  and also the osteophytes.  The noted bone graft was gently tapped into place  at this level.  It was obvious that this bone would not hold screws very  well.  It was therefore elected not to place a plate.  The wound was  irrigated.  Hemostasis was ensured.  Because the patient was on steroids and  concern for some oozing from the incision, a 7 mm Jackson-Pratt drain was  placed in the prevertebral space and exited via a separate incision.  The  platysma and subcutaneous tissues were reapproximated  with 3-0 Vicryl in an interrupted  fashion, and the skin was closed with 4-0  Vicryl in a running subcuticular fashion.  Benzoin and Steri-Strips were  placed and made occlusive with Telfa and Op-Site.  The patient was then  placed in an Aspen collar and returned to the recovery room in good  condition.      MWR/MEDQ  D:  03/25/2004  T:  03/25/2004  Job:  [id number]

## 2010-07-10 NOTE — Op Note (Signed)
[place]  Patient:    Kristina Patel, Kristina Patel                       MRN: [id number] Proc. Date: 01/05/00 Adm. Date:  [id number] Disc. Date: [id number] Attending:  Emeterio Reeve                           Operative Report  PREOPERATIVE DIAGNOSIS:  Right carpal tunnel syndrome.  POSTOPERATIVE DIAGNOSIS:  Right carpal tunnel syndrome.  OPERATION PROCEDURE:  Right carpal tunnel release.  SURGEON:  Payton Doughty, M.D.  SERVICE:  Neurosurgery.  ANESTHESIA:  Local lidocaine 1%.  COMPLICATIONS:  None.  INDICATIONS:  The patient is a 65 year old, right-handed, white female with right carpal tunnel syndrome.  DESCRIPTION OF PROCEDURE:  She is taken to the operating room after shaved, prepped and draped in the usual sterile fashion.  The skin at the on-line between the third and forth rays just distal to the proximal wrist crease was infiltrated with 1% lidocaine.  A linear incision was made approximately a centimeter long and spread dissection through the subcutaneous fat revealed the transverse carpal ligament which was carefully divided.  The median nerve identified.  Dissecting down the wrist, superficial and deep to the transverse carpal ligament was isolated and divided.  Careful inspection revealed complete release of the nerve.  Distally onto the palm the nerve was carefully explored and found to be free.  The wound was irrigated, hemostasis assured and the skin closed with a single layer of 4-0 nylon in an interrupted vertical mattress fashion.  Betadine and ______ dressing was applied and a hand wrap applied.  The patient returned to the recovery room in good condition. DD:  01/05/00 TD:  01/05/00 Job: [id number]

## 2010-07-28 ENCOUNTER — Ambulatory Visit
Admission: RE | Admit: 2010-07-28 | Discharge: 2010-07-28 | Disposition: A | Discharge status: Home / Self Care | Payer: Medicare Other | Source: Ambulatory Visit | Attending: Neurosurgery | Admitting: Neurosurgery

## 2010-07-28 ENCOUNTER — Other Ambulatory Visit: Payer: Self-pay | Admitting: Neurosurgery

## 2010-07-28 DIAGNOSIS — M47816 Spondylosis without myelopathy or radiculopathy, lumbar region: Secondary | ICD-10-CM

## 2010-09-29 ENCOUNTER — Other Ambulatory Visit: Payer: Self-pay | Admitting: Specialist

## 2010-09-29 DIAGNOSIS — Z1231 Encounter for screening mammogram for malignant neoplasm of breast: Secondary | ICD-10-CM

## 2010-10-08 ENCOUNTER — Ambulatory Visit: Payer: Medicare Other

## 2010-11-30 ENCOUNTER — Other Ambulatory Visit (HOSPITAL_COMMUNITY): Payer: Self-pay | Admitting: Specialist

## 2010-11-30 DIAGNOSIS — M545 Low back pain, unspecified: Secondary | ICD-10-CM

## 2010-12-03 ENCOUNTER — Ambulatory Visit: Payer: Medicare Other

## 2010-12-22 ENCOUNTER — Other Ambulatory Visit: Payer: Self-pay | Admitting: Neurosurgery

## 2010-12-22 DIAGNOSIS — M47812 Spondylosis without myelopathy or radiculopathy, cervical region: Secondary | ICD-10-CM

## 2010-12-29 ENCOUNTER — Ambulatory Visit
Admission: RE | Admit: 2010-12-29 | Discharge: 2010-12-29 | Disposition: A | Discharge status: Home / Self Care | Payer: Medicare Other | Source: Ambulatory Visit | Attending: Neurosurgery | Admitting: Neurosurgery

## 2010-12-29 DIAGNOSIS — M47812 Spondylosis without myelopathy or radiculopathy, cervical region: Secondary | ICD-10-CM

## 2011-01-18 ENCOUNTER — Telehealth (HOSPITAL_COMMUNITY): Payer: Self-pay

## 2011-01-18 NOTE — Telephone Encounter (Signed)
MRI Screening  Pt Weight *170** (If over 300 lbs, notify MRI technologist)  Claustrophobia yes  Ever worked around metal, filing, grinding, or welding? no  Always wore protective glasses?   Ever got metal in your eyes?  If yes, was it removed by a doctor?   Brain Surgery no   Inner Ear Surgery no  Renal/Liver Dz no  Heart Surgery no (if yes, what type and when  )  Pacemaker no  High BP yes  Eye Implants no  Pregnancy no  Diabetic no  Stent no (If yes, date of placement  )  Hx of cancer yes (If yes, what kind?   Ovarian, uterine, large melanoma)   1.  Report to radiology on first floor on *01/19/2011**  At 12 noon  2.  Do Not eat food after *6am          Do Not drink clear liquids after *10am  3.  If discharged the same day as procedure, you will need a responsible adult to drive You home.  Who will drive you home My Nurse Pola Corn  4.  If discharged the day of your procedure, a responsible adult should be with the patient      For 8 hours *My nurse  5.  If plans include a taxi or bus for transportation home after procedure, a friend or family      member must accompany you *N/A       6.  If taking routine medications, please list the names and dosages of all your medications, or bring these medications to the hospital for identification *Patient will bring meds   7.  Take usual medications the morning of your procedure, except blood pressure medications  8.  Do you have a history of heart problems?  no   9.  Do you have a cardiologist? no  10.  Do you have any metal or implants inside your body?  Raycage to lumbar spine & artificial knee to Right leg  11.  Do you have any allergies to food or medications? *Yes, will bring list.  Must use paper tape  12.  Do you have any allergies to latex or contrast dye? No.    13.  To whom were these instructions given? Kristina Patel, Patient

## 2011-01-19 ENCOUNTER — Encounter (HOSPITAL_COMMUNITY): Payer: Self-pay

## 2011-01-19 ENCOUNTER — Ambulatory Visit (HOSPITAL_COMMUNITY)
Admission: RE | Admit: 2011-01-19 | Discharge: 2011-01-19 | Disposition: A | Discharge status: Home / Self Care | Payer: Medicare Other | Source: Ambulatory Visit | Attending: Specialist | Admitting: Specialist

## 2011-01-19 ENCOUNTER — Other Ambulatory Visit (HOSPITAL_COMMUNITY): Payer: Self-pay | Admitting: Specialist

## 2011-01-19 DIAGNOSIS — M545 Low back pain, unspecified: Secondary | ICD-10-CM | POA: Insufficient documentation

## 2011-01-19 DIAGNOSIS — Z981 Arthrodesis status: Secondary | ICD-10-CM | POA: Insufficient documentation

## 2011-01-19 DIAGNOSIS — M51379 Other intervertebral disc degeneration, lumbosacral region without mention of lumbar back pain or lower extremity pain: Secondary | ICD-10-CM | POA: Insufficient documentation

## 2011-01-19 DIAGNOSIS — M5137 Other intervertebral disc degeneration, lumbosacral region: Secondary | ICD-10-CM | POA: Insufficient documentation

## 2011-01-19 DIAGNOSIS — M79609 Pain in unspecified limb: Secondary | ICD-10-CM | POA: Insufficient documentation

## 2011-01-19 DIAGNOSIS — R209 Unspecified disturbances of skin sensation: Secondary | ICD-10-CM | POA: Insufficient documentation

## 2011-01-19 HISTORY — DX: Malignant neoplasm of uterus, part unspecified: C55

## 2011-01-19 HISTORY — DX: Rheumatoid arthritis, unspecified: M06.9

## 2011-01-19 HISTORY — DX: Malignant neoplasm of unspecified ovary: C56.9

## 2011-01-19 HISTORY — DX: Reserved for concepts with insufficient information to code with codable children: IMO0002

## 2011-01-19 HISTORY — DX: Malignant melanoma of skin, unspecified: C43.9

## 2011-01-19 HISTORY — DX: Essential (primary) hypertension: I10

## 2011-01-19 HISTORY — DX: Disorder of kidney and ureter, unspecified: N28.9

## 2011-01-19 HISTORY — DX: Unspecified osteoarthritis, unspecified site: M19.90

## 2011-01-19 LAB — CREATININE, SERUM
Creatinine, Ser: 1.44 mg/dL — ABNORMAL HIGH (ref 0.50–1.10)
GFR calc Af Amer: 43 mL/min — ABNORMAL LOW (ref >90)
GFR calc non Af Amer: 37 mL/min — ABNORMAL LOW (ref >90)

## 2011-01-19 MED ORDER — FENTANYL CITRATE 0.05 MG/ML IJ SOLN
25.0000 ug | INTRAMUSCULAR | Status: DC | PRN
  Administered 2011-01-19 (×2): 50 ug via INTRAVENOUS

## 2011-01-19 MED ORDER — MIDAZOLAM HCL 2 MG/2ML IJ SOLN
1.0000 mg | INTRAMUSCULAR | Status: DC | PRN
  Administered 2011-01-19: 1 mg via INTRAVENOUS
  Administered 2011-01-19 (×2): 2 mg via INTRAVENOUS

## 2011-01-19 MED ORDER — MIDAZOLAM HCL 2 MG/2ML IJ SOLN
INTRAMUSCULAR | Status: AC
  Filled 2011-01-19: qty 10

## 2011-01-19 MED ORDER — GADOBENATE DIMEGLUMINE 529 MG/ML IV SOLN
8.0000 mL | Freq: Once | INTRAVENOUS | Status: AC | PRN
  Administered 2011-01-19: 8 mL via INTRAVENOUS

## 2011-01-19 MED ORDER — FENTANYL CITRATE 0.05 MG/ML IJ SOLN
INTRAMUSCULAR | Status: AC
  Filled 2011-01-19: qty 4

## 2011-01-19 NOTE — H&P (Signed)
DEVLYN PARISH is an 65 y.o. female.   Chief Complaint: Back pain HPI: Patient with history of back pain with radiation down left leg along with left foot paresthesias presents today for MRI lumbar spine via IV conscious sedation.  Past Medical History  Diagnosis Date  . Rheumatoid arthritis   . Hypertension   . Osteoarthritis   . Renal disease   . Degenerative disc disease   . Ovarian cancer   . Uterine cancer   . Melanoma     Past Surgical History  Procedure Date  . Cholecystectomy   . Mastectomy, partial   . Tonsillectomy   . Appendectomy   . Right knee replacement   . Operative hysteroscopy     No family history on file. Social History:  does not have a smoking history on file. She does not have any smokeless tobacco history on file. Her alcohol and drug histories not on file.  Allergies:  Allergies  Allergen Reactions  . Butalbital-Asa-Caff-Codeine (Fiorinal-Codeine) Anaphylaxis and Swelling  . Darvon Other (See Comments)    Irritable & hyper  . Ambien (Zolpidem Tartrate) Other (See Comments)    Sleep walking  . Ativan (Lorazepam) Other (See Comments)    Adverse reaction...made patient "act out"  . Tape     Allergic to plastic tape. The adhesive causes redness to skin as well as loss of skin.    Current outpatient prescriptions:Biotin 1000 MCG tablet, Take 1,000 mcg by mouth 2 (two) times daily as needed.  , Disp: , Rfl: ;  colchicine 0.6 MG tablet, Take 0.6 mg by mouth daily. Takes 1/2 tab daily , Disp: , Rfl: ;  cyclobenzaprine (FLEXERIL) 10 MG tablet, Take 10 mg by mouth at bedtime.  , Disp: , Rfl: ;  etanercept (ENBREL) 50 MG/ML injection, Inject into the skin once a week.  , Disp: , Rfl:  febuxostat (ULORIC) 40 MG tablet, Take 40 mg by mouth daily.  , Disp: , Rfl: ;  Flaxseed, Linseed, (FLAXSEED OIL) 1000 MG CAPS, Take 1,000 mg by mouth 1 day or 1 dose.  , Disp: , Rfl: ;  folic acid (FOLVITE) 800 MCG tablet, Take 400 mcg by mouth daily.  , Disp: , Rfl: ;   HYDROcodone-acetaminophen (NORCO) 10-325 MG per tablet, Take 1 tablet by mouth every 8 (eight) hours.  , Disp: , Rfl:  hydrOXYzine (ATARAX/VISTARIL) 50 MG tablet, Take 50 mg by mouth at bedtime.  , Disp: , Rfl: ;  indapamide (LOZOL) 2.5 MG tablet, Take 2.5 mg by mouth every morning.  , Disp: , Rfl: ;  lidocaine (LIDODERM) 5 %, Place 1 patch onto the skin daily. Remove & Discard patch within 12 hours or as directed by MD , Disp: , Rfl: ;  lidocaine (XYLOCAINE) 5 % ointment, Apply 1 application topically as needed.  , Disp: , Rfl:  methotrexate 1 G injection, Inject 4 mg into the vein once a week.  , Disp: , Rfl: ;  metoprolol succinate (TOPROL-XL) 25 MG 24 hr tablet, Take 25 mg by mouth 2 (two) times daily. 1/4 tablet twice a day , Disp: , Rfl: ;  ondansetron (ZOFRAN) 8 MG tablet, Take 8 mg by mouth every 8 (eight) hours as needed.  , Disp: , Rfl: ;  pravastatin (PRAVACHOL) 20 MG tablet, Take 20 mg by mouth at bedtime.  , Disp: , Rfl:  pseudoephedrine (SUDAFED) 30 MG tablet, Take 30 mg by mouth every 6 (six) hours as needed. 2 tabs every 6 hours as needed ,  Disp: , Rfl: ;  tiZANidine (ZANAFLEX) 4 MG tablet, Take 4 mg by mouth at bedtime.  , Disp: , Rfl: ;  clindamycin (CLEOCIN) 150 MG capsule, Take 150 mg by mouth once. One hour prior to dental work , Disp: , Rfl:  diphenhydrAMINE (BENADRYL) 25 MG tablet, Take 25 mg by mouth every 6 (six) hours as needed. As recommended for itching , Disp: , Rfl:        Review of Systems  Constitutional: Negative for fever and chills.  Respiratory: Negative for cough and shortness of breath.   Cardiovascular: Negative for chest pain.  Gastrointestinal: Negative for nausea, vomiting and abdominal pain.  Musculoskeletal: Positive for back pain and joint pain.  Neurological: Negative for tingling and headaches.       Left foot paresthesias  Endo/Heme/Allergies: Does not bruise/bleed easily.    Blood pressure 117/70, pulse 69, temperature 97.7 F (36.5 C), height  5\' 3"  (1.6 m), weight 170 lb (77.111 kg), SpO2 98.00%. Physical Exam  Constitutional: She is oriented to person, place, and time. She appears well-developed and well-nourished.  Cardiovascular: Normal rate.        Occasional ectopy noted  Respiratory: Effort normal and breath sounds normal.  GI: Soft. Bowel sounds are normal. She exhibits no distension. There is no tenderness.  Musculoskeletal: Normal range of motion. She exhibits no edema.       Low back pain  Neurological: She is alert and oriented to person, place, and time.  Psychiatric: She has a normal mood and affect.     Assessment/Plan Patient with history of low back pain with radiation down left leg and left foot paresthesias; plan is for MRI lumbar spine with IV conscious sedation.  Janijah Symons,D KEVIN 01/19/2011, 1:46 PM

## 2011-03-26 ENCOUNTER — Other Ambulatory Visit: Payer: Self-pay | Admitting: Neurosurgery

## 2011-03-26 ENCOUNTER — Ambulatory Visit
Admission: RE | Admit: 2011-03-26 | Discharge: 2011-03-26 | Disposition: A | Discharge status: Home / Self Care | Payer: Medicare Other | Source: Ambulatory Visit | Attending: Neurosurgery | Admitting: Neurosurgery

## 2011-03-26 DIAGNOSIS — M479 Spondylosis, unspecified: Secondary | ICD-10-CM

## 2011-04-12 DIAGNOSIS — M069 Rheumatoid arthritis, unspecified: Secondary | ICD-10-CM | POA: Insufficient documentation

## 2011-06-09 ENCOUNTER — Ambulatory Visit
Admission: RE | Admit: 2011-06-09 | Discharge: 2011-06-09 | Disposition: A | Discharge status: Home / Self Care | Payer: Medicare Other | Source: Ambulatory Visit | Attending: Specialist | Admitting: Specialist

## 2011-06-09 DIAGNOSIS — Z1231 Encounter for screening mammogram for malignant neoplasm of breast: Secondary | ICD-10-CM

## 2011-06-11 ENCOUNTER — Other Ambulatory Visit: Payer: Self-pay | Admitting: Specialist

## 2011-06-11 DIAGNOSIS — R928 Other abnormal and inconclusive findings on diagnostic imaging of breast: Secondary | ICD-10-CM

## 2011-06-14 ENCOUNTER — Ambulatory Visit
Admission: RE | Admit: 2011-06-14 | Discharge: 2011-06-14 | Disposition: A | Discharge status: Home / Self Care | Payer: Medicare Other | Source: Ambulatory Visit | Attending: Specialist | Admitting: Specialist

## 2011-06-14 DIAGNOSIS — R928 Other abnormal and inconclusive findings on diagnostic imaging of breast: Secondary | ICD-10-CM

## 2011-09-16 ENCOUNTER — Encounter: Payer: Self-pay | Admitting: Family Medicine

## 2011-11-14 ENCOUNTER — Encounter (HOSPITAL_COMMUNITY): Payer: Self-pay | Admitting: *Deleted

## 2011-11-14 ENCOUNTER — Emergency Department (HOSPITAL_COMMUNITY): Payer: Medicare Other

## 2011-11-14 ENCOUNTER — Inpatient Hospital Stay (HOSPITAL_COMMUNITY)
Admission: EM | Admit: 2011-11-14 | Discharge: 2011-11-17 | DRG: 091 | Disposition: A | Discharge status: Home / Self Care | Payer: Medicare Other | Attending: Internal Medicine | Admitting: Internal Medicine

## 2011-11-14 DIAGNOSIS — T4275XA Adverse effect of unspecified antiepileptic and sedative-hypnotic drugs, initial encounter: Secondary | ICD-10-CM | POA: Diagnosis present

## 2011-11-14 DIAGNOSIS — Z8542 Personal history of malignant neoplasm of other parts of uterus: Secondary | ICD-10-CM

## 2011-11-14 DIAGNOSIS — F1120 Opioid dependence, uncomplicated: Secondary | ICD-10-CM | POA: Diagnosis present

## 2011-11-14 DIAGNOSIS — E782 Mixed hyperlipidemia: Secondary | ICD-10-CM | POA: Diagnosis present

## 2011-11-14 DIAGNOSIS — R4182 Altered mental status, unspecified: Secondary | ICD-10-CM

## 2011-11-14 DIAGNOSIS — Z96659 Presence of unspecified artificial knee joint: Secondary | ICD-10-CM

## 2011-11-14 DIAGNOSIS — E119 Type 2 diabetes mellitus without complications: Secondary | ICD-10-CM | POA: Diagnosis present

## 2011-11-14 DIAGNOSIS — Z8543 Personal history of malignant neoplasm of ovary: Secondary | ICD-10-CM

## 2011-11-14 DIAGNOSIS — G92 Toxic encephalopathy: Principal | ICD-10-CM | POA: Diagnosis present

## 2011-11-14 DIAGNOSIS — F1320 Sedative, hypnotic or anxiolytic dependence, uncomplicated: Secondary | ICD-10-CM | POA: Diagnosis present

## 2011-11-14 DIAGNOSIS — T43205A Adverse effect of unspecified antidepressants, initial encounter: Secondary | ICD-10-CM | POA: Diagnosis present

## 2011-11-14 DIAGNOSIS — J209 Acute bronchitis, unspecified: Secondary | ICD-10-CM | POA: Diagnosis present

## 2011-11-14 DIAGNOSIS — M109 Gout, unspecified: Secondary | ICD-10-CM | POA: Diagnosis present

## 2011-11-14 DIAGNOSIS — T450X5A Adverse effect of antiallergic and antiemetic drugs, initial encounter: Secondary | ICD-10-CM | POA: Diagnosis present

## 2011-11-14 DIAGNOSIS — Z85820 Personal history of malignant melanoma of skin: Secondary | ICD-10-CM

## 2011-11-14 DIAGNOSIS — M1990 Unspecified osteoarthritis, unspecified site: Secondary | ICD-10-CM | POA: Diagnosis present

## 2011-11-14 DIAGNOSIS — G894 Chronic pain syndrome: Secondary | ICD-10-CM | POA: Diagnosis present

## 2011-11-14 DIAGNOSIS — E785 Hyperlipidemia, unspecified: Secondary | ICD-10-CM | POA: Diagnosis present

## 2011-11-14 DIAGNOSIS — G9340 Encephalopathy, unspecified: Secondary | ICD-10-CM | POA: Diagnosis present

## 2011-11-14 DIAGNOSIS — I10 Essential (primary) hypertension: Secondary | ICD-10-CM | POA: Diagnosis present

## 2011-11-14 DIAGNOSIS — J189 Pneumonia, unspecified organism: Secondary | ICD-10-CM | POA: Diagnosis present

## 2011-11-14 DIAGNOSIS — G929 Unspecified toxic encephalopathy: Principal | ICD-10-CM | POA: Diagnosis present

## 2011-11-14 DIAGNOSIS — F411 Generalized anxiety disorder: Secondary | ICD-10-CM | POA: Diagnosis present

## 2011-11-14 DIAGNOSIS — M069 Rheumatoid arthritis, unspecified: Secondary | ICD-10-CM | POA: Diagnosis present

## 2011-11-14 DIAGNOSIS — H9209 Otalgia, unspecified ear: Secondary | ICD-10-CM | POA: Diagnosis present

## 2011-11-14 DIAGNOSIS — G589 Mononeuropathy, unspecified: Secondary | ICD-10-CM | POA: Diagnosis present

## 2011-11-14 DIAGNOSIS — F419 Anxiety disorder, unspecified: Secondary | ICD-10-CM | POA: Diagnosis present

## 2011-11-14 DIAGNOSIS — Z79899 Other long term (current) drug therapy: Secondary | ICD-10-CM

## 2011-11-14 HISTORY — DX: Hyperlipidemia, unspecified: E78.5

## 2011-11-14 LAB — COMPREHENSIVE METABOLIC PANEL
ALT: 27 U/L (ref 0–35)
AST: 18 U/L (ref 0–37)
Albumin: 3.5 g/dL (ref 3.5–5.2)
Calcium: 9.6 mg/dL (ref 8.4–10.5)
GFR calc Af Amer: 72 mL/min — ABNORMAL LOW (ref >90)
Sodium: 136 mEq/L (ref 135–145)
Total Protein: 7 g/dL (ref 6.0–8.3)

## 2011-11-14 LAB — URINALYSIS, ROUTINE W REFLEX MICROSCOPIC
Bilirubin Urine: NEGATIVE
Hgb urine dipstick: NEGATIVE
Nitrite: NEGATIVE
Specific Gravity, Urine: 1.014 (ref 1.005–1.030)
pH: 6 (ref 5.0–8.0)

## 2011-11-14 LAB — RAPID URINE DRUG SCREEN, HOSP PERFORMED
Benzodiazepines: POSITIVE — AB
Cocaine: NOT DETECTED
Opiates: NOT DETECTED
Tetrahydrocannabinol: NOT DETECTED

## 2011-11-14 LAB — CBC WITH DIFFERENTIAL/PLATELET
Basophils Absolute: 0 10*3/uL (ref 0.0–0.1)
Basophils Relative: 0 % (ref 0–1)
Eosinophils Absolute: 0.2 10*3/uL (ref 0.0–0.7)
Eosinophils Relative: 2 % (ref 0–5)
MCH: 27.9 pg (ref 26.0–34.0)
MCV: 81 fL (ref 78.0–100.0)
Platelets: 216 10*3/uL (ref 150–400)
RDW: 13.6 % (ref 11.5–15.5)
WBC: 8.4 10*3/uL (ref 4.0–10.5)

## 2011-11-14 LAB — URINE MICROSCOPIC-ADD ON

## 2011-11-14 MED ORDER — SIMVASTATIN 10 MG PO TABS
10.0000 mg | ORAL_TABLET | Freq: Every day | ORAL | Status: DC
  Administered 2011-11-15 – 2011-11-17 (×3): 10 mg via ORAL
  Filled 2011-11-14 (×3): qty 1

## 2011-11-14 MED ORDER — AMITRIPTYLINE HCL 75 MG PO TABS
75.0000 mg | ORAL_TABLET | Freq: Every day | ORAL | Status: DC
  Administered 2011-11-15 – 2011-11-16 (×3): 75 mg via ORAL
  Filled 2011-11-14 (×4): qty 1

## 2011-11-14 MED ORDER — SODIUM CHLORIDE 0.9 % IV BOLUS (SEPSIS)
250.0000 mL | Freq: Once | INTRAVENOUS | Status: AC
  Administered 2011-11-14: 250 mL via INTRAVENOUS

## 2011-11-14 MED ORDER — SODIUM CHLORIDE 0.9 % IV SOLN: INTRAVENOUS | Status: DC

## 2011-11-14 MED ORDER — METOPROLOL TARTRATE 25 MG PO TABS
25.0000 mg | ORAL_TABLET | Freq: Two times a day (BID) | ORAL | Status: DC
  Administered 2011-11-15 – 2011-11-17 (×6): 25 mg via ORAL
  Filled 2011-11-14 (×7): qty 1

## 2011-11-14 MED ORDER — OXYCODONE HCL 5 MG PO TABS
10.0000 mg | ORAL_TABLET | Freq: Four times a day (QID) | ORAL | Status: DC
  Administered 2011-11-14 – 2011-11-17 (×10): 10 mg via ORAL
  Filled 2011-11-14 (×8): qty 2
  Filled 2011-11-14 (×2): qty 1
  Filled 2011-11-14: qty 2

## 2011-11-14 MED ORDER — METOCLOPRAMIDE HCL 10 MG PO TABS
10.0000 mg | ORAL_TABLET | Freq: Two times a day (BID) | ORAL | Status: DC
  Administered 2011-11-15 – 2011-11-17 (×6): 10 mg via ORAL
  Filled 2011-11-14 (×7): qty 1

## 2011-11-14 MED ORDER — ALPRAZOLAM 0.5 MG PO TABS
0.5000 mg | ORAL_TABLET | Freq: Two times a day (BID) | ORAL | Status: DC | PRN
  Administered 2011-11-14 – 2011-11-17 (×4): 0.5 mg via ORAL
  Filled 2011-11-14 (×5): qty 1

## 2011-11-14 MED ORDER — LIDOCAINE 5 % EX PTCH
1.0000 | MEDICATED_PATCH | Freq: Every day | CUTANEOUS | Status: DC | PRN
Start: 2011-11-14 — End: 2011-11-17
  Administered 2011-11-16 – 2011-11-17 (×2): 1 via TRANSDERMAL
  Filled 2011-11-14 (×3): qty 1

## 2011-11-14 MED ORDER — TOFACITINIB CITRATE 5 MG PO TABS
1.0000 | ORAL_TABLET | Freq: Every day | ORAL | Status: DC
  Administered 2011-11-15 – 2011-11-17 (×3): 1 via ORAL
  Filled 2011-11-14: qty 1

## 2011-11-14 MED ORDER — SODIUM CHLORIDE 0.9 % IV SOLN
INTRAVENOUS | Status: AC
  Administered 2011-11-14 – 2011-11-15 (×2): via INTRAVENOUS

## 2011-11-14 MED ORDER — NEOMYCIN-POLYMYXIN-HC 3.5-10000-1 OT SOLN
2.0000 [drp] | OTIC | Status: DC
  Administered 2011-11-15 – 2011-11-17 (×15): 2 [drp] via OTIC
  Filled 2011-11-14: qty 10

## 2011-11-14 MED ORDER — CYCLOBENZAPRINE HCL 10 MG PO TABS
10.0000 mg | ORAL_TABLET | Freq: Three times a day (TID) | ORAL | Status: DC | PRN
  Administered 2011-11-15 – 2011-11-16 (×2): 10 mg via ORAL
  Filled 2011-11-14 (×3): qty 1

## 2011-11-14 MED ORDER — AZITHROMYCIN 250 MG PO TABS
500.0000 mg | ORAL_TABLET | Freq: Once | ORAL | Status: AC
  Administered 2011-11-14: 500 mg via ORAL
  Filled 2011-11-14: qty 2

## 2011-11-14 MED ORDER — AZITHROMYCIN 500 MG IV SOLR
500.0000 mg | INTRAVENOUS | Status: DC
  Administered 2011-11-15: 500 mg via INTRAVENOUS
  Filled 2011-11-14 (×2): qty 500

## 2011-11-14 MED ORDER — FEBUXOSTAT 40 MG PO TABS
40.0000 mg | ORAL_TABLET | Freq: Every day | ORAL | Status: DC
  Administered 2011-11-15 – 2011-11-17 (×3): 40 mg via ORAL
  Filled 2011-11-14 (×5): qty 1

## 2011-11-14 MED ORDER — PANTOPRAZOLE SODIUM 40 MG PO TBEC
40.0000 mg | DELAYED_RELEASE_TABLET | Freq: Every day | ORAL | Status: DC
  Administered 2011-11-15 – 2011-11-17 (×3): 40 mg via ORAL
  Filled 2011-11-14 (×3): qty 1

## 2011-11-14 MED ORDER — FOLIC ACID 1 MG PO TABS
1.0000 mg | ORAL_TABLET | Freq: Every day | ORAL | Status: DC
  Administered 2011-11-15 – 2011-11-17 (×3): 1 mg via ORAL
  Filled 2011-11-14 (×3): qty 1

## 2011-11-14 MED ORDER — POTASSIUM CHLORIDE 20 MEQ PO PACK
20.0000 meq | PACK | Freq: Every day | ORAL | Status: DC
  Filled 2011-11-14: qty 1

## 2011-11-14 MED ORDER — DEXTROSE 5 % IV SOLN
1.0000 g | INTRAVENOUS | Status: DC
  Administered 2011-11-15 – 2011-11-16 (×2): 1 g via INTRAVENOUS
  Filled 2011-11-14 (×3): qty 10

## 2011-11-14 MED ORDER — DEXTROSE 5 % IV SOLN
1.0000 g | Freq: Once | INTRAVENOUS | Status: AC
  Administered 2011-11-14: 1 g via INTRAVENOUS
  Filled 2011-11-14: qty 10

## 2011-11-14 NOTE — H&P (Addendum)
Kristina Patel is an 66 y.o. female.   Patient was seen and examined on November 14, 2011 at 10:15 PM. PCP - Dr. Lerry Liner. Chief Complaint: Confusion and memory problems. HPI: 66 year old female with history of rheumatoid arthritis, hypertension, hyperlipidemia has been having problems with memory for last few weeks but since last 3 days patient has been noticed that she has become more confused and has been repeating words but has not lost consciousness and has not noticed any focal deficits. Patient does not complain of any headache fever chills nausea vomiting or abdominal pain. In the ER patient was found to be mildly confused but hasn't had any focal deficits. CT head was negative for any acute. Chest x-ray shows features of pneumonia. Patient at this time has been admitted for further management. Patient states she has been having some cough and productive sputum with running nose for last 3 weeks. She also noticed some blood in the nasal secretions and last couple of days she has been having hemoptysis. Denies any fever chills night sweats weight loss any joint swellings. Patient was started on amitriptyline 6 weeks ago for lower extremity numbness. Patient also was changed to new anti-rheumatoid biological agent 6 weeks ago. Otherwise patient is not on any new medications recently. She is on pain relief medications which she states she takes regularly and has not taken more than required.  Past Medical History  Diagnosis Date  . Rheumatoid arthritis   . Hypertension   . Osteoarthritis   . Renal disease   . Degenerative disc disease   . Ovarian cancer   . Uterine cancer   . Melanoma   . Hyperlipidemia     Past Surgical History  Procedure Date  . Cholecystectomy   . Mastectomy, partial   . Tonsillectomy   . Appendectomy   . Right knee replacement   . Operative hysteroscopy   . Back surgery   . Cervical spine surgery     History reviewed. No pertinent family  history. Social History:  reports that she has never smoked. She does not have any smokeless tobacco history on file. She reports that she does not drink alcohol or use illicit drugs.  Allergies:  Allergies  Allergen Reactions  . Butalbital-Asa-Caff-Codeine (Fiorinal-Codeine) Anaphylaxis and Swelling  . Darvon Other (See Comments)    Irritable & hyper  . Ambien (Zolpidem Tartrate) Other (See Comments)    Sleep walking  . Ativan (Lorazepam) Other (See Comments)    Adverse reaction...made patient "act out"  . Tape Other (See Comments)    Allergic to plastic tape. The adhesive causes redness to skin as well as loss of skin.    Medications Prior to Admission  Medication Sig Dispense Refill  . ALPRAZolam (XANAX) 0.5 MG tablet Take 0.5 mg by mouth every 12 (twelve) hours as needed. For anxiety      . amitriptyline (ELAVIL) 25 MG tablet Take 75 mg by mouth at bedtime.      . Biotin 1000 MCG tablet Take 1,000 mcg by mouth 2 (two) times daily as needed.        . Calcium Carbonate-Vitamin D (CALCIUM 600 + D PO) Take 1 tablet by mouth daily.      . clindamycin (CLEOCIN) 150 MG capsule Take 600 mg by mouth once. One hour prior to dental work      . cyclobenzaprine (FLEXERIL) 10 MG tablet Take 10 mg by mouth every 8 (eight) hours.       . diclofenac sodium (  VOLTAREN) 1 % GEL Apply 4 g topically 4 (four) times daily as needed. To joints for pain      . diphenhydrAMINE (BENADRYL) 25 MG tablet Take 25 mg by mouth as needed. As recommended for itching      . ergocalciferol (VITAMIN D2) 50000 UNITS capsule Take 50,000 Units by mouth once a week.      . febuxostat (ULORIC) 40 MG tablet Take 40 mg by mouth daily.        . Flaxseed, Linseed, (FLAXSEED OIL) 1000 MG CAPS Take 1,000 mg by mouth 4 (four) times daily.       . folic acid (FOLVITE) 1 MG tablet Take 1 mg by mouth daily.      . hydrOXYzine (ATARAX/VISTARIL) 10 MG tablet Take 20 mg by mouth 2 (two) times daily.      Marland Kitchen lidocaine (LIDODERM) 5 % Place  1 patch onto the skin daily as needed. FOR PAIN.    Remove & Discard patch within 12 hours or as directed by MD      . lidocaine (XYLOCAINE) 5 % ointment Apply 1 application topically as needed. FOR PAIN      . MAGNESIUM OXIDE PO Take 429 mg by mouth 2 (two) times daily.      . metoCLOPramide (REGLAN) 10 MG tablet Take 10 mg by mouth 2 (two) times daily.      . metoprolol tartrate (LOPRESSOR) 25 MG tablet Take 25 mg by mouth 2 (two) times daily.      Marland Kitchen neomycin-polymyxin-hydrocortisone (CORTISPORIN) otic solution Place 2 drops into both ears every 4 (four) hours.      Marland Kitchen omeprazole (PRILOSEC) 20 MG capsule Take 20 mg by mouth at bedtime.      Marland Kitchen oxyCODONE (OXY IR/ROXICODONE) 5 MG immediate release tablet Take 10 mg by mouth 4 (four) times daily.      . potassium chloride (KLOR-CON) 20 MEQ packet Take 20 mEq by mouth daily.      . pravastatin (PRAVACHOL) 20 MG tablet Take 20 mg by mouth at bedtime.        . Tofacitinib Citrate 5 MG TABS Take 1 tablet by mouth every morning.        Results for orders placed during the hospital encounter of 11/14/11 (from the past 48 hour(s))  CBC WITH DIFFERENTIAL     Status: Abnormal   Collection Time   11/14/11  4:08 PM      Component Value Range Comment   WBC 8.4  4.0 - 10.5 K/uL    RBC 4.26  3.87 - 5.11 MIL/uL    Hemoglobin 11.9 (*) 12.0 - 15.0 g/dL    HCT 16.1 (*) 09.6 - 46.0 %    MCV 81.0  78.0 - 100.0 fL    MCH 27.9  26.0 - 34.0 pg    MCHC 34.5  30.0 - 36.0 g/dL    RDW 04.5  40.9 - 81.1 %    Platelets 216  150 - 400 K/uL    Neutrophils Relative 58  43 - 77 %    Neutro Abs 4.9  1.7 - 7.7 K/uL    Lymphocytes Relative 33  12 - 46 %    Lymphs Abs 2.8  0.7 - 4.0 K/uL    Monocytes Relative 7  3 - 12 %    Monocytes Absolute 0.6  0.1 - 1.0 K/uL    Eosinophils Relative 2  0 - 5 %    Eosinophils Absolute 0.2  0.0 - 0.7 K/uL  Basophils Relative 0  0 - 1 %    Basophils Absolute 0.0  0.0 - 0.1 K/uL   COMPREHENSIVE METABOLIC PANEL     Status: Abnormal    Collection Time   11/14/11  4:08 PM      Component Value Range Comment   Sodium 136  135 - 145 mEq/L    Potassium 4.0  3.5 - 5.1 mEq/L    Chloride 101  96 - 112 mEq/L    CO2 21  19 - 32 mEq/L    Glucose, Bld 242 (*) 70 - 99 mg/dL    BUN 15  6 - 23 mg/dL    Creatinine, Ser 1.61  0.50 - 1.10 mg/dL    Calcium 9.6  8.4 - 09.6 mg/dL    Total Protein 7.0  6.0 - 8.3 g/dL    Albumin 3.5  3.5 - 5.2 g/dL    AST 18  0 - 37 U/L    ALT 27  0 - 35 U/L    Alkaline Phosphatase 142 (*) 39 - 117 U/L    Total Bilirubin 0.2 (*) 0.3 - 1.2 mg/dL    GFR calc non Af Amer 62 (*) >90 mL/min    GFR calc Af Amer 72 (*) >90 mL/min   PRO B NATRIURETIC PEPTIDE     Status: Normal   Collection Time   11/14/11  4:09 PM      Component Value Range Comment   Pro B Natriuretic peptide (BNP) 113.3  0 - 125 pg/mL   TROPONIN I     Status: Normal   Collection Time   11/14/11  4:09 PM      Component Value Range Comment   Troponin I <0.30  <0.30 ng/mL   URINALYSIS, ROUTINE W REFLEX MICROSCOPIC     Status: Abnormal   Collection Time   11/14/11  4:33 PM      Component Value Range Comment   Color, Urine YELLOW  YELLOW    APPearance CLEAR  CLEAR    Specific Gravity, Urine 1.014  1.005 - 1.030    pH 6.0  5.0 - 8.0    Glucose, UA 100 (*) NEGATIVE mg/dL    Hgb urine dipstick NEGATIVE  NEGATIVE    Bilirubin Urine NEGATIVE  NEGATIVE    Ketones, ur NEGATIVE  NEGATIVE mg/dL    Protein, ur 30 (*) NEGATIVE mg/dL    Urobilinogen, UA 0.2  0.0 - 1.0 mg/dL    Nitrite NEGATIVE  NEGATIVE    Leukocytes, UA NEGATIVE  NEGATIVE   URINE MICROSCOPIC-ADD ON     Status: Abnormal   Collection Time   11/14/11  4:33 PM      Component Value Range Comment   Squamous Epithelial / LPF FEW (*) RARE   URINE RAPID DRUG SCREEN (HOSP PERFORMED)     Status: Abnormal   Collection Time   11/14/11  8:40 PM      Component Value Range Comment   Opiates NONE DETECTED  NONE DETECTED    Cocaine NONE DETECTED  NONE DETECTED    Benzodiazepines POSITIVE (*)  NONE DETECTED    Amphetamines NONE DETECTED  NONE DETECTED    Tetrahydrocannabinol NONE DETECTED  NONE DETECTED    Barbiturates NONE DETECTED  NONE DETECTED   ETHANOL     Status: Normal   Collection Time   11/14/11  9:34 PM      Component Value Range Comment   Alcohol, Ethyl (B) <11  0 - 11 mg/dL  Dg Chest 2 View  11/14/2011  *RADIOLOGY REPORT*  Clinical Data: 66 year old female with diabetes and end-stage renal disease with possible fluid collection or abscess about the left upper extremity dialysis graft.  CHEST - 2 VIEW  Comparison: 10/17/2008.  Findings: Seated AP and lateral views of the chest.  Lower lung volumes.  Increased peribronchovascular opacity about the inferior right hilum.  Accentuation of cardiac and mediastinal contours. Visualized tracheal air column is within normal limits.  No pneumothorax.  No overt pulmonary edema.  No pleural effusion. Chronic lower thoracic compression deformities status post augmentation.  Chronic upper lumbar fusion with ray cages.  IMPRESSION: Low lung volumes.  Right lower lobe peribronchovascular opacity could reflect atelectasis or developing pneumonia. No pleural effusion or overt pulmonary edema.   Original Report Authenticated By: Harley Hallmark, M.D.    Ct Head Wo Contrast  11/14/2011  *RADIOLOGY REPORT*  Clinical Data: Altered mental status.  Chronic neuropathy.  CT HEAD WITHOUT CONTRAST  Technique:  Contiguous axial images were obtained from the base of the skull through the vertex without contrast.  Comparison: 11/11/2008  Findings: The brain stem, cerebellum, cerebral peduncles, thalami, basal ganglia, basilar cisterns, and ventricular system appear unremarkable.  No intracranial hemorrhage, mass lesion, or acute infarction is identified.  Chronic right sphenoid sinusitis noted.  Remaining visualized paranasal sinuses appear clear.  IMPRESSION:  1.  Chronic right sphenoid sinusitis.   Otherwise, no significant abnormality identified.   Original  Report Authenticated By: Dellia Cloud, M.D.     Review of Systems  Constitutional: Negative.   HENT: Negative.   Eyes: Negative.   Respiratory: Positive for cough and hemoptysis.   Cardiovascular: Negative.   Gastrointestinal: Negative.   Genitourinary: Negative.   Musculoskeletal: Negative.   Skin: Negative.   Neurological:       Confusion and memory problems.  Endo/Heme/Allergies: Negative.   Psychiatric/Behavioral: Positive for memory loss.    Blood pressure 182/140, pulse 107, temperature 97.9 F (36.6 C), temperature source Oral, resp. rate 19, height 5\' 3"  (1.6 m), weight 84.369 kg (186 lb), SpO2 98.00%. Physical Exam  Constitutional: She is oriented to person, place, and time. She appears well-developed and well-nourished. No distress.  HENT:  Head: Normocephalic and atraumatic.  Right Ear: External ear normal.  Left Ear: External ear normal.  Nose: Nose normal.  Mouth/Throat: Oropharynx is clear and moist. No oropharyngeal exudate.  Eyes: Conjunctivae normal are normal. Pupils are equal, round, and reactive to light. Right eye exhibits no discharge. Left eye exhibits no discharge. No scleral icterus.  Neck: Normal range of motion. Neck supple.  Cardiovascular: Normal rate and regular rhythm.   Respiratory: Effort normal and breath sounds normal. No respiratory distress. She has no wheezes. She has no rales.  GI: Soft. Bowel sounds are normal. She exhibits no distension. There is no tenderness. There is no rebound.  Musculoskeletal: Normal range of motion. She exhibits no edema and no tenderness.  Neurological: She is alert and oriented to person, place, and time.       Moves all extremities 5/5. No tongue deviation. No facial asymmetry.   Skin: She is not diaphoretic.     Assessment/Plan #1. Acute encephalopathy/acute delirium - causes not clear but patient is afebrile and does not have leukocytosis. At this time I have ordered MRI brain. We will check  ammonia level, TSH, RPR, B12 and folate levels. Patient's chest x-ray does show pneumonic process which may also contribute to her acute changes in mental status. We  will check salicylate and Tylenol levels. #2. Possible pneumonia - chest x-ray shows infiltrates concerning for pneumonia. Since patient also has been having cough productive of sputum for last few days with hemoptysis patient has been started antibiotics for community-acquired pneumonia which we will continue. Since patient has rheumatoid arthritis and has been on immunosuppressants given her hemoptysis I will get a CT chest without contrast to further study the lesion. Patient does not have any night sweats weight loss and has never been exposed to TB before. #3. Rheumatoid arthritis - continue her arthritis medications. #4. Hypertension - mildly uncontrolled - continue home medications and I also have added when necessary IV hydralazine for systolic blood pressure more than 160. #5. Anxiety - continue home medications. #6. History of recent diagnosed neuropathy on amitriptyline - patient has good deep tendon reflexes so less concern for GBS. Continue amitriptyline for now. May consider decreasing the dose if no other cause for her mental status changes identified. #7. Gout - continue present medications. No flareup at this time. #8. Hyperglycemia - check hemoglobin A1 C.  CODE STATUS - full code.  Eduard Clos 11/14/2011, 10:34 PM

## 2011-11-14 NOTE — ED Notes (Signed)
Patient reports for 6 to 8 mths she has had increased pain all over and weakness.  Patient states for the past several days patient has been confused.  Speech has been jumbled.  Spouse states he has not been able to carry on a conversation per her normal.  Patient denies burning when urinating but told her husband she had to really push to void.  Patient has noted swelling in her legs and feet.  Patient has pain all over and reports headache

## 2011-11-14 NOTE — ED Provider Notes (Addendum)
History     CSN: [id number]  Arrival date & time 11/14/11  1537   First MD Initiated Contact with Patient 11/14/11 1707      Chief Complaint  Patient presents with  . Altered Mental Status    (Consider location/radiation/quality/duration/timing/severity/associated sxs/prior treatment) The history is provided by the patient and the spouse.   patient is a 66 year old female brought in by her husband patient has had marked confusion today seemed to be having memory problems stating things that are not accurate however is alert and very talkative. Patient has a history of bad rheumatoid arthritis her she's had joint pain for a long period of time the history from the patient is very difficult to obtain because she's all over the map on He was going on level V caveat applies.  Past Medical History  Diagnosis Date  . Rheumatoid arthritis   . Hypertension   . Osteoarthritis   . Renal disease   . Degenerative disc disease   . Ovarian cancer   . Uterine cancer   . Melanoma     Past Surgical History  Procedure Date  . Cholecystectomy   . Mastectomy, partial   . Tonsillectomy   . Appendectomy   . Right knee replacement   . Operative hysteroscopy   . Back surgery   . Cervical spine surgery     No family history on file.  History  Substance Use Topics  . Smoking status: Never Smoker   . Smokeless tobacco: Not on file  . Alcohol Use: No    OB History    Grav Para Term Preterm Abortions TAB SAB Ect Mult Living                  Review of Systems  Psychiatric/Behavioral: Positive for confusion.   otherwise level V caveat applies due to confusion.  Allergies  Butalbital-asa-caff-codeine; Darvon; Ambien; Ativan; and Tape  Home Medications   Current Outpatient Rx  Name Route Sig Dispense Refill  . ALPRAZOLAM 0.5 MG PO TABS Oral Take 0.5 mg by mouth every 12 (twelve) hours as needed. For anxiety    . AMITRIPTYLINE HCL 25 MG PO TABS Oral Take 75 mg by mouth at bedtime.     Marland Kitchen BIOTIN 1000 MCG PO TABS Oral Take 1,000 mcg by mouth 2 (two) times daily as needed.      Marland Kitchen CALCIUM 600 + D PO Oral Take 1 tablet by mouth daily.    Marland Kitchen CLINDAMYCIN HCL 150 MG PO CAPS Oral Take 600 mg by mouth once. One hour prior to dental work    . CYCLOBENZAPRINE HCL 10 MG PO TABS Oral Take 10 mg by mouth every 8 (eight) hours.     Marland Kitchen DICLOFENAC SODIUM 1 % TD GEL Topical Apply 4 g topically 4 (four) times daily as needed. To joints for pain    . DIPHENHYDRAMINE HCL 25 MG PO TABS Oral Take 25 mg by mouth as needed. As recommended for itching    . ERGOCALCIFEROL 50000 UNITS PO CAPS Oral Take 50,000 Units by mouth once a week.    . FEBUXOSTAT 40 MG PO TABS Oral Take 40 mg by mouth daily.      Marland Kitchen FLAXSEED OIL 1000 MG PO CAPS Oral Take 1,000 mg by mouth 4 (four) times daily.     Marland Kitchen FOLIC ACID 1 MG PO TABS Oral Take 1 mg by mouth daily.    Marland Kitchen HYDROXYZINE HCL 10 MG PO TABS Oral Take 20 mg by mouth  2 (two) times daily.    Marland Kitchen LIDOCAINE 5 % EX PTCH Transdermal Place 1 patch onto the skin daily as needed. FOR PAIN.    Remove & Discard patch within 12 hours or as directed by MD    . LIDOCAINE 5 % EX OINT Topical Apply 1 application topically as needed. FOR PAIN    . MAGNESIUM OXIDE PO Oral Take 429 mg by mouth 2 (two) times daily.    Marland Kitchen METOCLOPRAMIDE HCL 10 MG PO TABS Oral Take 10 mg by mouth 2 (two) times daily.    Marland Kitchen METOPROLOL TARTRATE 25 MG PO TABS Oral Take 25 mg by mouth 2 (two) times daily.    . NEOMYCIN-POLYMYXIN-HC 3.5-10000-1 OT SOLN Both Ears Place 2 drops into both ears every 4 (four) hours.    . OMEPRAZOLE 20 MG PO CPDR Oral Take 20 mg by mouth at bedtime.    . OXYCODONE HCL 5 MG PO TABS Oral Take 10 mg by mouth 4 (four) times daily.    Marland Kitchen POTASSIUM CHLORIDE 20 MEQ PO PACK Oral Take 20 mEq by mouth daily.    Marland Kitchen PRAVASTATIN SODIUM 20 MG PO TABS Oral Take 20 mg by mouth at bedtime.      Marland Kitchen TOFACITINIB CITRATE 5 MG PO TABS Oral Take 1 tablet by mouth every morning.      BP 165/96  Pulse 109   Temp 98.3 F (36.8 C) (Oral)  Resp 18  Ht 5\' 3"  (1.6 m)  Wt 186 lb (84.369 kg)  BMI 32.95 kg/m2  SpO2 96%  Physical Exam  Nursing note and vitals reviewed. Constitutional: She appears well-developed and well-nourished.  HENT:  Head: Normocephalic and atraumatic.  Mouth/Throat: Oropharynx is clear and moist. No oropharyngeal exudate.  Eyes: Conjunctivae normal and EOM are normal. Pupils are equal, round, and reactive to light.  Neck: Normal range of motion. Neck supple.  Cardiovascular: Normal rate, regular rhythm and normal heart sounds.   No murmur heard. Pulmonary/Chest: Effort normal and breath sounds normal. No respiratory distress. She has no wheezes. She has no rales. She exhibits no tenderness.  Abdominal: Soft. Bowel sounds are normal. There is no tenderness.  Musculoskeletal: Normal range of motion.       Some joint tenderness. No erythema  Lymphadenopathy:    She has no cervical adenopathy.  Neurological: She is alert. No cranial nerve deficit. She exhibits normal muscle tone. Coordination normal.       Alert but confused.    ED Course  Procedures (including critical care time)  Labs Reviewed  CBC WITH DIFFERENTIAL - Abnormal; Notable for the following:    Hemoglobin 11.9 (*)     HCT 34.5 (*)     All other components within normal limits  COMPREHENSIVE METABOLIC PANEL - Abnormal; Notable for the following:    Glucose, Bld 242 (*)     Alkaline Phosphatase 142 (*)     Total Bilirubin 0.2 (*)     GFR calc non Af Amer 62 (*)     GFR calc Af Amer 72 (*)     All other components within normal limits  URINALYSIS, ROUTINE W REFLEX MICROSCOPIC - Abnormal; Notable for the following:    Glucose, UA 100 (*)     Protein, ur 30 (*)     All other components within normal limits  URINE MICROSCOPIC-ADD ON - Abnormal; Notable for the following:    Squamous Epithelial / LPF FEW (*)     All other components within normal limits  PRO B NATRIURETIC PEPTIDE  TROPONIN I   Dg  Chest 2 View  11/14/2011  *RADIOLOGY REPORT*  Clinical Data: 66 year old female with diabetes and end-stage renal disease with possible fluid collection or abscess about the left upper extremity dialysis graft.  CHEST - 2 VIEW  Comparison: 10/17/2008.  Findings: Seated AP and lateral views of the chest.  Lower lung volumes.  Increased peribronchovascular opacity about the inferior right hilum.  Accentuation of cardiac and mediastinal contours. Visualized tracheal air column is within normal limits.  No pneumothorax.  No overt pulmonary edema.  No pleural effusion. Chronic lower thoracic compression deformities status post augmentation.  Chronic upper lumbar fusion with ray cages.  IMPRESSION: Low lung volumes.  Right lower lobe peribronchovascular opacity could reflect atelectasis or developing pneumonia. No pleural effusion or overt pulmonary edema.   Original Report Authenticated By: Harley Hallmark, M.D.    Ct Head Wo Contrast  11/14/2011  *RADIOLOGY REPORT*  Clinical Data: Altered mental status.  Chronic neuropathy.  CT HEAD WITHOUT CONTRAST  Technique:  Contiguous axial images were obtained from the base of the skull through the vertex without contrast.  Comparison: 11/11/2008  Findings: The brain stem, cerebellum, cerebral peduncles, thalami, basal ganglia, basilar cisterns, and ventricular system appear unremarkable.  No intracranial hemorrhage, mass lesion, or acute infarction is identified.  Chronic right sphenoid sinusitis noted.  Remaining visualized paranasal sinuses appear clear.  IMPRESSION:  1.  Chronic right sphenoid sinusitis.   Otherwise, no significant abnormality identified.   Original Report Authenticated By: Dellia Cloud, M.D.      Date: 11/14/2011  Rate: 101  Rhythm: sinus tachycardia  QRS Axis: normal  Intervals: QT prolonged  ST/T Wave abnormalities: normal and indeterminate  Conduction Disutrbances:none and second-degree A-V block, ( Mobitz I )  Narrative  Interpretation:   Old EKG Reviewed: none available and unchanged No significant change since 11/11/08    Results for orders placed during the hospital encounter of 11/14/11  CBC WITH DIFFERENTIAL      Component Value Range   WBC 8.4  4.0 - 10.5 K/uL   RBC 4.26  3.87 - 5.11 MIL/uL   Hemoglobin 11.9 (*) 12.0 - 15.0 g/dL   HCT 82.9 (*) 56.2 - 13.0 %   MCV 81.0  78.0 - 100.0 fL   MCH 27.9  26.0 - 34.0 pg   MCHC 34.5  30.0 - 36.0 g/dL   RDW 86.5  78.4 - 69.6 %   Platelets 216  150 - 400 K/uL   Neutrophils Relative 58  43 - 77 %   Neutro Abs 4.9  1.7 - 7.7 K/uL   Lymphocytes Relative 33  12 - 46 %   Lymphs Abs 2.8  0.7 - 4.0 K/uL   Monocytes Relative 7  3 - 12 %   Monocytes Absolute 0.6  0.1 - 1.0 K/uL   Eosinophils Relative 2  0 - 5 %   Eosinophils Absolute 0.2  0.0 - 0.7 K/uL   Basophils Relative 0  0 - 1 %   Basophils Absolute 0.0  0.0 - 0.1 K/uL  COMPREHENSIVE METABOLIC PANEL      Component Value Range   Sodium 136  135 - 145 mEq/L   Potassium 4.0  3.5 - 5.1 mEq/L   Chloride 101  96 - 112 mEq/L   CO2 21  19 - 32 mEq/L   Glucose, Bld 242 (*) 70 - 99 mg/dL   BUN 15  6 - 23 mg/dL  Creatinine, Ser 0.94  0.50 - 1.10 mg/dL   Calcium 9.6  8.4 - 65.7 mg/dL   Total Protein 7.0  6.0 - 8.3 g/dL   Albumin 3.5  3.5 - 5.2 g/dL   AST 18  0 - 37 U/L   ALT 27  0 - 35 U/L   Alkaline Phosphatase 142 (*) 39 - 117 U/L   Total Bilirubin 0.2 (*) 0.3 - 1.2 mg/dL   GFR calc non Af Amer 62 (*) >90 mL/min   GFR calc Af Amer 72 (*) >90 mL/min  URINALYSIS, ROUTINE W REFLEX MICROSCOPIC      Component Value Range   Color, Urine YELLOW  YELLOW   APPearance CLEAR  CLEAR   Specific Gravity, Urine 1.014  1.005 - 1.030   pH 6.0  5.0 - 8.0   Glucose, UA 100 (*) NEGATIVE mg/dL   Hgb urine dipstick NEGATIVE  NEGATIVE   Bilirubin Urine NEGATIVE  NEGATIVE   Ketones, ur NEGATIVE  NEGATIVE mg/dL   Protein, ur 30 (*) NEGATIVE mg/dL   Urobilinogen, UA 0.2  0.0 - 1.0 mg/dL   Nitrite NEGATIVE  NEGATIVE    Leukocytes, UA NEGATIVE  NEGATIVE  PRO B NATRIURETIC PEPTIDE      Component Value Range   Pro B Natriuretic peptide (BNP) 113.3  0 - 125 pg/mL  URINE MICROSCOPIC-ADD ON      Component Value Range   Squamous Epithelial / LPF FEW (*) RARE  TROPONIN I      Component Value Range   Troponin I <0.30  <0.30 ng/mL     1. Altered mental status   2. CAP (community acquired pneumonia)       MDM  Patient alert but has confusion but apparently the mental status changes at [place] from today. Workup without any cystic findings other than the possibility of developing pneumonia which would be a community-acquired pneumonia. Patient has not had any hospital admissions in the last 3 months. No allergies to any antibiotics will be treated with IV Rocephin and by mouth Zithromax. We will discuss with the [facility] team for admission. Her primary care doctor D Mayford Knife.        Shelda Jakes, MD 11/14/11 2004  Shelda Jakes, MD 11/15/11 1344

## 2011-11-14 NOTE — ED Notes (Signed)
Pt c/o pain in extremities from her chronic neuropathy, sore throat X 2 weeks. Pt's husband reports she feels her short term memory is declining. Pt reports she thinks she fell but doesn't remember exactly what happened, pt sts she was watching tv late at night then stood up to use the restroom, pt doesn't remember what happened after that, pt has dark blue bruise on posterior right shoulder. Pt also reports she feels like she isn't able to get her words out sometimes. Speech is clear, no asymmetrical facial expression, no weakness/numbness in extremities.

## 2011-11-15 ENCOUNTER — Inpatient Hospital Stay (HOSPITAL_COMMUNITY): Payer: Medicare Other

## 2011-11-15 ENCOUNTER — Encounter (HOSPITAL_COMMUNITY): Payer: Self-pay | Admitting: Radiology

## 2011-11-15 DIAGNOSIS — R4182 Altered mental status, unspecified: Secondary | ICD-10-CM

## 2011-11-15 LAB — TROPONIN I: Troponin I: <0.3 ng/mL (ref <0.30)

## 2011-11-15 LAB — RAPID URINE DRUG SCREEN, HOSP PERFORMED
Amphetamines: NOT DETECTED
Barbiturates: NOT DETECTED
Benzodiazepines: NOT DETECTED
Cocaine: NOT DETECTED
Tetrahydrocannabinol: NOT DETECTED

## 2011-11-15 LAB — GLUCOSE, CAPILLARY: Glucose-Capillary: 240 mg/dL — ABNORMAL HIGH (ref 70–99)

## 2011-11-15 LAB — HEMOGLOBIN A1C: Mean Plasma Glucose: 177 mg/dL — ABNORMAL HIGH (ref <117)

## 2011-11-15 LAB — LIPID PANEL
Cholesterol: 182 mg/dL (ref 0–200)
Triglycerides: 283 mg/dL — ABNORMAL HIGH (ref <150)

## 2011-11-15 LAB — RPR: RPR Ser Ql: NONREACTIVE

## 2011-11-15 LAB — FOLATE: Folate: >20 ng/mL

## 2011-11-15 MED ORDER — DIAZEPAM 5 MG/ML IJ SOLN
2.5000 mg | Freq: Once | INTRAMUSCULAR | Status: AC | PRN
  Administered 2011-11-15: 2.5 mg via INTRAVENOUS
  Filled 2011-11-15: qty 2

## 2011-11-15 MED ORDER — ACETAMINOPHEN 325 MG PO TABS
650.0000 mg | ORAL_TABLET | Freq: Four times a day (QID) | ORAL | Status: DC | PRN
  Administered 2011-11-15 – 2011-11-17 (×4): 650 mg via ORAL
  Filled 2011-11-15 (×5): qty 2

## 2011-11-15 MED ORDER — POTASSIUM CHLORIDE CRYS ER 20 MEQ PO TBCR
20.0000 meq | EXTENDED_RELEASE_TABLET | Freq: Every day | ORAL | Status: DC
  Administered 2011-11-15 – 2011-11-17 (×3): 20 meq via ORAL
  Filled 2011-11-15 (×3): qty 1

## 2011-11-15 MED ORDER — ONDANSETRON HCL 4 MG/2ML IJ SOLN: 4.0000 mg | Freq: Four times a day (QID) | INTRAMUSCULAR | Status: DC | PRN

## 2011-11-15 MED ORDER — HYDRALAZINE HCL 20 MG/ML IJ SOLN
10.0000 mg | INTRAMUSCULAR | Status: DC | PRN
  Administered 2011-11-17 (×2): 10 mg via INTRAVENOUS
  Filled 2011-11-15 (×2): qty 0.5

## 2011-11-15 NOTE — Progress Notes (Signed)
Transporter came and took pt off of tele to go to MRI before asking nurse if pt had an order to travel off tele. RN paged MD on call to get an order for the pt to travel off tele.

## 2011-11-15 NOTE — Progress Notes (Signed)
[facility] PROGRESS NOTE  Kristina Patel BJY:[id number] DOB: 06-11-1945 DOA: 11/14/2011 PCP: Jearld Lesch, MD  Assessment/Plan: Principal Problem:  *Encephalopathy acute Active Problems:  Community acquired pneumonia  Rheumatoid arthritis  HTN (hypertension)  Hyperlipidemia  Gout  Anxiety   Acute delirium -Unclear etiology, so far negative workup including negative TSH, folate and B12. -MRI of the brain, RPR still pending. -Today patient is lucid, her husband she did have short-term memory problems for the past several months. -Urine drug screen is negative, even for the medication she supposed to take (benzos and narcotics).  Questionable pneumonia -CT scan of the chest showed no evidence of pneumonia. -She does not have leukocytosis, no fever. Patient does not have pneumonia. -If she continues to complain about cough and sputum production, I might continue treatment for acute bronchitis. -Because being reported that she does have hemoptysis, will she complains about severe sore throat. -I think the sputum get tinged in the throat on its way out. I'll check a throat strep swab.  Rheumatoid arthritis -Continue her arthritis medications including Xeljanz.  Hyperglycemia -Check hemoglobin A1c  Right ear ache -I don't know if she suffers from infection or any new issues or Rocephin for her bronchitis.  Code Status: Full Family Communication: Husband is a bedside Disposition Plan: Remains inpatient   Brief narrative: 66 year old female brought to the hospital by her husband because of worsening confusion.  Consultants:  None  Procedures:  *None  Antibiotics:  Rocephin and azithromycin started 11/14/2011  HPI/Subjective: Feels better, denies any shortness of breath or cough  Objective: Filed Vitals:   11/15/11 0447 11/15/11 0704 11/15/11 0914 11/15/11 1126  BP: 163/87 175/98 180/103 158/81  Pulse: 96 109 105 106  Temp: 97.9 F (36.6 C) 98.4  F (36.9 C) 98.1 F (36.7 C)   TempSrc: Oral Oral Oral   Resp: 20 18 20    Height:      Weight:      SpO2: 96% 98% 98%     Intake/Output Summary (Last 24 hours) at 11/15/11 1150 Last data filed at 11/15/11 0900  Gross per 24 hour  Intake      0 ml  Output    200 ml  Net   -200 ml   Filed Weights   11/14/11 1548 11/14/11 2241  Weight: 84.369 kg (186 lb) 96.8 kg (213 lb 6.5 oz)    Exam:  General: Alert and awake, oriented x3, not in any acute distress. HEENT: anicteric sclera, pupils reactive to light and accommodation, EOMI CVS: S1-S2 clear, no murmur rubs or gallops Chest: clear to auscultation bilaterally, no wheezing, rales or rhonchi Abdomen: soft nontender, nondistended, normal bowel sounds, no organomegaly Extremities: no cyanosis, clubbing or edema noted bilaterally Neuro: Cranial nerves II-XII intact, no focal neurological deficits  Data Reviewed: Basic Metabolic Panel:  Lab 11/15/11 0865 11/14/11 1608  NA -- 136  K -- 4.0  CL -- 101  CO2 -- 21  GLUCOSE -- 242*  BUN -- 15  CREATININE -- 0.94  CALCIUM -- 9.6  MG 1.9 --  PHOS -- --   Liver Function Tests:  Lab 11/14/11 1608  AST 18  ALT 27  ALKPHOS 142*  BILITOT 0.2*  PROT 7.0  ALBUMIN 3.5   No results found for this basename: LIPASE:5,AMYLASE:5 in the last 168 hours  Lab 11/15/11 0057  AMMONIA 28   CBC:  Lab 11/14/11 1608  WBC 8.4  NEUTROABS 4.9  HGB 11.9*  HCT 34.5*  MCV 81.0  PLT 216   Cardiac Enzymes:  Lab 11/15/11 0058 11/14/11 1609  CKTOTAL -- --  CKMB -- --  CKMBINDEX -- --  TROPONINI <0.30 <0.30   BNP (last 3 results)  Basename 11/14/11 1609  PROBNP 113.3   CBG:  Lab 11/15/11 0846  GLUCAP 175*    No results found for this or any previous visit (from the past 240 hour(s)).   Studies: Dg Chest 2 View  11/14/2011  *RADIOLOGY REPORT*  Clinical Data: 66 year old female with diabetes and end-stage renal disease with possible fluid collection or abscess about the left  upper extremity dialysis graft.  CHEST - 2 VIEW  Comparison: 10/17/2008.  Findings: Seated AP and lateral views of the chest.  Lower lung volumes.  Increased peribronchovascular opacity about the inferior right hilum.  Accentuation of cardiac and mediastinal contours. Visualized tracheal air column is within normal limits.  No pneumothorax.  No overt pulmonary edema.  No pleural effusion. Chronic lower thoracic compression deformities status post augmentation.  Chronic upper lumbar fusion with ray cages.  IMPRESSION: Low lung volumes.  Right lower lobe peribronchovascular opacity could reflect atelectasis or developing pneumonia. No pleural effusion or overt pulmonary edema.   Original Report Authenticated By: Harley Hallmark, M.D.    Ct Head Wo Contrast  11/14/2011  *RADIOLOGY REPORT*  Clinical Data: Altered mental status.  Chronic neuropathy.  CT HEAD WITHOUT CONTRAST  Technique:  Contiguous axial images were obtained from the base of the skull through the vertex without contrast.  Comparison: 11/11/2008  Findings: The brain stem, cerebellum, cerebral peduncles, thalami, basal ganglia, basilar cisterns, and ventricular system appear unremarkable.  No intracranial hemorrhage, mass lesion, or acute infarction is identified.  Chronic right sphenoid sinusitis noted.  Remaining visualized paranasal sinuses appear clear.  IMPRESSION:  1.  Chronic right sphenoid sinusitis.   Otherwise, no significant abnormality identified.   Original Report Authenticated By: Dellia Cloud, M.D.    Ct Chest Wo Contrast  11/15/2011  *RADIOLOGY REPORT*  Clinical Data: Right-sided pulmonary opacity.  Hemoptysis.  CT CHEST WITHOUT CONTRAST  Technique:  Multidetector CT imaging of the chest was performed following the standard protocol without IV contrast.  Comparison: Chest radiograph 11/14/2011.  Findings: There is no airspace disease in the lungs.  The opacity at the right cardiophrenic angle represents a prominent pericardial  fat pad.  Dependent atelectasis is present in the lungs.  Scattered sub centimeter pulmonary nodules are present.  The largest of these measures 5 mm in the right middle lobe (image number 30 series 3). Follow-up is required.  Incidental imaging of the upper abdomen demonstrates cholecystectomy and fatty liver.  Renal parenchymal scarring is present.  Eventration of the right hemidiaphragm. There is no effusion.  No adenopathy. Coronary artery atherosclerosis is present. If office based assessment of coronary risk factors has not been performed, it is now recommended. No aggressive osseous lesions.  T12 vertebral augmentation.  Mild thoracic spondylosis.  IMPRESSION: 1.  Right cardiophrenic angle opacity on the prior radiograph is accounted for by a prominent pericardial fat pad. 2.  Scattered sub centimeter pulmonary nodules with the largest measuring 5 mm in the right middle lobe. If the patient is at high risk for bronchogenic carcinoma, follow-up chest CT at 6-12 months is recommended.  If the patient is at low risk for bronchogenic carcinoma, follow-up chest CT at 12 months is recommended.  This recommendation follows the consensus statement: Guidelines for Management of Small Pulmonary Nodules Detected on CT Scans: A Statement from  the Fleischner Society as published in Radiology 2005; 237:395-400. 3.  Fatty liver and cholecystectomy. 4.  Coronary artery disease.   Original Report Authenticated By: Andreas Newport, M.D.     Scheduled Meds:   . amitriptyline  75 mg Oral QHS  . azithromycin  500 mg Intravenous Q24H  . azithromycin  500 mg Oral Once  . cefTRIAXone (ROCEPHIN)  IV  1 g Intravenous Once  . cefTRIAXone (ROCEPHIN)  IV  1 g Intravenous Q24H  . febuxostat  40 mg Oral Daily  . folic acid  1 mg Oral Daily  . metoCLOPramide  10 mg Oral BID  . metoprolol tartrate  25 mg Oral BID  . neomycin-polymyxin-hydrocortisone  2 drop Both Ears Q4H  . oxyCODONE  10 mg Oral QID  . pantoprazole  40 mg Oral  Q1200  . potassium chloride  20 mEq Oral Daily  . simvastatin  10 mg Oral q1800  . sodium chloride  250 mL Intravenous Once  . Tofacitinib Citrate  1 tablet Oral q morning - 10a  . DISCONTD: sodium chloride   Intravenous STAT  . DISCONTD: potassium chloride  20 mEq Oral Daily   Continuous Infusions:   . sodium chloride 75 mL/hr at 11/15/11 1126  . DISCONTD: sodium chloride      Principal Problem:  *Encephalopathy acute Active Problems:  Community acquired pneumonia  Rheumatoid arthritis  HTN (hypertension)  Hyperlipidemia  Gout  Anxiety    Time spent: 35 minutes    [facility] A  [facility] Pager [phone]. If 8PM-8AM, please contact night-coverage at [email or web address], password [facility] 11/15/2011, 11:50 AM  LOS: 1 day

## 2011-11-15 NOTE — Progress Notes (Signed)
EEG completed.

## 2011-11-15 NOTE — Evaluation (Signed)
Clinical/Bedside Swallow Evaluation Patient Details  Name: Kristina Patel MRN: [id number] Date of Birth: 01/26/1946  Today's Date: 11/15/2011 Time: 0820-0858 SLP Time Calculation (min): 38 min  Past Medical History:  Past Medical History  Diagnosis Date  . Rheumatoid arthritis   . Hypertension   . Osteoarthritis   . Renal disease   . Degenerative disc disease   . Ovarian cancer   . Uterine cancer   . Melanoma   . Hyperlipidemia    Past Surgical History:  Past Surgical History  Procedure Date  . Cholecystectomy   . Mastectomy, partial   . Tonsillectomy   . Appendectomy   . Right knee replacement   . Operative hysteroscopy   . Back surgery   . Cervical spine surgery    HPI:  66 year old female with history of rheumatoid arthritis, hypertension, hyperlipidemia has been having problems with memory for last few weeks but since last 3 days patient has been noticed that she has become more confused and has been repeating words but has not lost consciousness and has not noticed any focal deficits. Patient does not complain of any headache fever chills nausea vomiting or abdominal pain. In the ER patient was found to be mildly confused but hasn't had any focal deficits. CT head was negative for any acute. Chest x-ray shows features of pneumonia. Pt and husband report globus over past 3 months. Reflux suspected. Pt had 3 MBS in 2006 but reports are not available. Neither pt nor husband able to recal whay she had these tests.    Assessment / Plan / Recommendation Clinical Impression  Pt presents with normal oral and oropharyngeal fucntion with no overt s/s of aspiration. Pt does demonstrate pain with swallow associated with sore throat and earache. Pt also vebalize occasional globus over the ast 3 months though it was improved recently. pt is confused an cannot recall meds,but she did go to GI and may have been dx with GERD. Likely taking some kind of meds. There is some risk that pna may  be a result of nighttime LPR. MD may wish to f/u on this point. t is recommended to initiate Regular diet with thin liquids. SLp will f/u x1 for educatn regarding esophageal rpecuations and check of tolerance given questionble history of dysphagia.     Aspiration Risk  Mild    Diet Recommendation Regular;Thin liquid   Liquid Administration via: Straw;Cup Medication Administration: Whole meds with liquid Supervision: Patient able to self feed Compensations: Slow rate;Small sips/bites;Follow solids with liquid Postural Changes and/or Swallow Maneuvers: Seated upright 90 degrees;Upright 30-60 min after meal    Other  Recommendations Recommended Consults: Consider esophageal assessment Oral Care Recommendations: Oral care BID   Follow Up Recommendations  None    Frequency and Duration min 1 x/week  1 week   Pertinent Vitals/Pain NA    SLP Swallow Goals Patient will utilize recommended strategies during swallow to increase swallowing safety with: Minimal assistance   Swallow Study Prior Functional Status       General HPI: 66 year old female with history of rheumatoid arthritis, hypertension, hyperlipidemia has been having problems with memory for last few weeks but since last 3 days patient has been noticed that she has become more confused and has been repeating words but has not lost consciousness and has not noticed any focal deficits. Patient does not complain of any headache fever chills nausea vomiting or abdominal pain. In the ER patient was found to be mildly confused but hasn't had any focal  deficits. CT head was negative for any acute. Chest x-ray shows features of pneumonia. Pt and husband report globus over past 3 months. Reflux suspected. Pt had 3 MBS in 2006 but reports are not available. Neither pt nor husband able to recal whay she had these tests.  Type of Study: Bedside swallow evaluation Previous Swallow Assessment: 3 MBS 2006 Diet Prior to this Study:  NPO Respiratory Status: Room air Behavior/Cognition: Confused;Alert;Cooperative Oral Cavity - Dentition: Adequate natural dentition (partial) Patient Positioning: Upright in bed Baseline Vocal Quality: Hoarse Volitional Cough: Strong Volitional Swallow: Able to elicit    Oral/Motor/Sensory Function Overall Oral Motor/Sensory Function: Appears within functional limits for tasks assessed   Ice Chips     Thin Liquid Thin Liquid: Within functional limits    Nectar Thick Nectar Thick Liquid: Not tested   Honey Thick Honey Thick Liquid: Not tested   Puree Puree: Within functional limits   Solid   GO    Solid: Within functional limits      [facility], [place] CCC-SLP [phone]  Claudine Mouton 11/15/2011,9:07 AM

## 2011-11-16 LAB — GLUCOSE, CAPILLARY
Glucose-Capillary: 229 mg/dL — ABNORMAL HIGH (ref 70–99)
Glucose-Capillary: 201 mg/dL — ABNORMAL HIGH (ref 70–99)

## 2011-11-16 LAB — CBC
HCT: 33.3 % — ABNORMAL LOW (ref 36.0–46.0)
RBC: 4.11 MIL/uL (ref 3.87–5.11)
RDW: 13.6 % (ref 11.5–15.5)
WBC: 8.3 10*3/uL (ref 4.0–10.5)

## 2011-11-16 LAB — BASIC METABOLIC PANEL
Chloride: 105 mEq/L (ref 96–112)
Creatinine, Ser: 0.9 mg/dL (ref 0.50–1.10)
GFR calc Af Amer: 76 mL/min — ABNORMAL LOW (ref >90)
Potassium: 3.5 mEq/L (ref 3.5–5.1)
Sodium: 140 mEq/L (ref 135–145)

## 2011-11-16 MED ORDER — LIVING WELL WITH DIABETES BOOK
Freq: Once | Status: DC
  Filled 2011-11-16: qty 1

## 2011-11-16 MED ORDER — POTASSIUM CHLORIDE CRYS ER 20 MEQ PO TBCR
40.0000 meq | EXTENDED_RELEASE_TABLET | Freq: Once | ORAL | Status: AC
  Administered 2011-11-16: 40 meq via ORAL
  Filled 2011-11-16 (×2): qty 1

## 2011-11-16 MED ORDER — MORPHINE SULFATE 2 MG/ML IJ SOLN
1.0000 mg | INTRAMUSCULAR | Status: DC | PRN
  Administered 2011-11-16 – 2011-11-17 (×2): 2 mg via INTRAVENOUS
  Filled 2011-11-16 (×2): qty 1

## 2011-11-16 NOTE — Procedures (Signed)
EEG NUMBER:  [id number].  REFERRING PHYSICIAN:  Clydia Llano, MD  INDICATION FOR STUDY:  A 66 year old lady with memory difficulty and confusion of unclear etiology.  DESCRIPTION:  This is a routine EEG recording performed during drowsiness and during sleep.  Predominant background activity during drowsiness consisted of mixed irregular delta and theta activity diffusely.  Background activity was symmetrical.  Photic stimulation produced a symmetrical occipital driving response.  Hyperventilation was not performed.  During sleep, symmetrical vertex waves, sleep spindles, and K complexes were recorded.  No epileptiform discharges were recorded.  There were no areas of disproportionate focal slowing.  INTERPRETATION:  This is a normal EEG during drowsiness and during sleep.  No evidence of an epileptic disorder was seen.     Noel Christmas, MD    NF:AOZH D:  11/15/2011 17:26:49  T:  11/16/2011 07:35:37  Job #:  [id number]

## 2011-11-16 NOTE — Progress Notes (Signed)
[facility] PROGRESS NOTE  Kristina Patel ZOX:[id number] DOB: 1946/01/27 DOA: 11/14/2011 PCP: Jearld Lesch, MD  Assessment/Plan: Principal Problem:  *Encephalopathy acute Active Problems:  Community acquired pneumonia  Rheumatoid arthritis  HTN (hypertension)  Hyperlipidemia  Gout  Anxiety   Acute delirium -Unclear etiology, so far negative workup including negative RPR, TSH, folate and B12. -MRI of the brain showed no acute intracranial abnormalities, she has opacification of right mastoid air cells. -Today patient is lucid, her husband she did have short-term memory problems for the past several months. -Urine drug screen is negative, even for the medication she supposed to take (benzos and narcotics).  Questionable pneumonia -CT scan of the chest showed NO evidence of pneumonia. -She does not have leukocytosis, no fever. Patient does not have pneumonia. -If she continues to complain about cough and sputum production, I might continue treatment for acute bronchitis. -Because being reported that she does have hemoptysis, will she complains about severe sore throat. -I think the sputum get tinged in the throat on its way out. I'll check a throat strep swab.  Rheumatoid arthritis -Continue her arthritis medications including Xeljanz.  Hyperglycemia -Check hemoglobin A1c  Right ear ache -I don't know if she has infection, MRI showed opacification of the air cells. -Anyway she is getting Rocephin for her bronchitis, and it will help if there is infection.  Code Status: Full Family Communication: Husband is a bedside Disposition Plan: Remains inpatient   Brief narrative: 66 year old female brought to the hospital by her husband because of worsening confusion.  Consultants:  None  Procedures:  EEG: Showed no changes  Antibiotics:  Rocephin and azithromycin started 11/14/2011.  I will discontinue the azithromycin on  11/15/2028  HPI/Subjective: Feels better, she carried a meaningful conversation, per nursing staff she was confused and mixing stuff when she is eating  Objective: Filed Vitals:   11/15/11 2234 11/16/11 0105 11/16/11 0500 11/16/11 0950  BP: 176/99 106/78 156/90 151/75  Pulse: 107 92 87 90  Temp: 98.2 F (36.8 C) 98.1 F (36.7 C) 98.2 F (36.8 C) 98 F (36.7 C)  TempSrc: Oral Oral Oral Oral  Resp: 20 16 16 18   Height:      Weight:      SpO2: 99% 97% 97% 99%   No intake or output data in the 24 hours ending 11/16/11 1238 Filed Weights   11/14/11 1548 11/14/11 2241  Weight: 84.369 kg (186 lb) 96.8 kg (213 lb 6.5 oz)    Exam:  General: Alert and awake, oriented x3, not in any acute distress. HEENT: anicteric sclera, pupils reactive to light and accommodation, EOMI CVS: S1-S2 clear, no murmur rubs or gallops Chest: clear to auscultation bilaterally, no wheezing, rales or rhonchi Abdomen: soft nontender, nondistended, normal bowel sounds, no organomegaly Extremities: no cyanosis, clubbing or edema noted bilaterally Neuro: Cranial nerves II-XII intact, no focal neurological deficits  Data Reviewed: Basic Metabolic Panel:  Lab 11/16/11 8119 11/15/11 0620 11/14/11 1608  NA 140 -- 136  K 3.5 -- 4.0  CL 105 -- 101  CO2 22 -- 21  GLUCOSE 180* -- 242*  BUN 13 -- 15  CREATININE 0.90 -- 0.94  CALCIUM 9.5 -- 9.6  MG -- 1.9 --  PHOS -- -- --   Liver Function Tests:  Lab 11/14/11 1608  AST 18  ALT 27  ALKPHOS 142*  BILITOT 0.2*  PROT 7.0  ALBUMIN 3.5   No results found for this basename: LIPASE:5,AMYLASE:5 in the last 168 hours  Lab 11/15/11 0057  AMMONIA 28   CBC:  Lab 11/16/11 0635 11/14/11 1608  WBC 8.3 8.4  NEUTROABS -- 4.9  HGB 11.5* 11.9*  HCT 33.3* 34.5*  MCV 81.0 81.0  PLT 209 216   Cardiac Enzymes:  Lab 11/15/11 0058 11/14/11 1609  CKTOTAL -- --  CKMB -- --  CKMBINDEX -- --  TROPONINI <0.30 <0.30   BNP (last 3 results)  Basename 11/14/11  1609  PROBNP 113.3   CBG:  Lab 11/16/11 1209 11/16/11 0814 11/16/11 0103 11/15/11 1815 11/15/11 1248  GLUCAP 201* 184* 229* 220* 240*    No results found for this or any previous visit (from the past 240 hour(s)).   Studies: Dg Chest 2 View  11/14/2011  *RADIOLOGY REPORT*  Clinical Data: 66 year old female with diabetes and end-stage renal disease with possible fluid collection or abscess about the left upper extremity dialysis graft.  CHEST - 2 VIEW  Comparison: 10/17/2008.  Findings: Seated AP and lateral views of the chest.  Lower lung volumes.  Increased peribronchovascular opacity about the inferior right hilum.  Accentuation of cardiac and mediastinal contours. Visualized tracheal air column is within normal limits.  No pneumothorax.  No overt pulmonary edema.  No pleural effusion. Chronic lower thoracic compression deformities status post augmentation.  Chronic upper lumbar fusion with ray cages.  IMPRESSION: Low lung volumes.  Right lower lobe peribronchovascular opacity could reflect atelectasis or developing pneumonia. No pleural effusion or overt pulmonary edema.   Original Report Authenticated By: Harley Hallmark, M.D.    Ct Head Wo Contrast  11/14/2011  *RADIOLOGY REPORT*  Clinical Data: Altered mental status.  Chronic neuropathy.  CT HEAD WITHOUT CONTRAST  Technique:  Contiguous axial images were obtained from the base of the skull through the vertex without contrast.  Comparison: 11/11/2008  Findings: The brain stem, cerebellum, cerebral peduncles, thalami, basal ganglia, basilar cisterns, and ventricular system appear unremarkable.  No intracranial hemorrhage, mass lesion, or acute infarction is identified.  Chronic right sphenoid sinusitis noted.  Remaining visualized paranasal sinuses appear clear.  IMPRESSION:  1.  Chronic right sphenoid sinusitis.   Otherwise, no significant abnormality identified.   Original Report Authenticated By: Dellia Cloud, M.D.    Ct Chest Wo  Contrast  11/15/2011  *RADIOLOGY REPORT*  Clinical Data: Right-sided pulmonary opacity.  Hemoptysis.  CT CHEST WITHOUT CONTRAST  Technique:  Multidetector CT imaging of the chest was performed following the standard protocol without IV contrast.  Comparison: Chest radiograph 11/14/2011.  Findings: There is no airspace disease in the lungs.  The opacity at the right cardiophrenic angle represents a prominent pericardial fat pad.  Dependent atelectasis is present in the lungs.  Scattered sub centimeter pulmonary nodules are present.  The largest of these measures 5 mm in the right middle lobe (image number 30 series 3). Follow-up is required.  Incidental imaging of the upper abdomen demonstrates cholecystectomy and fatty liver.  Renal parenchymal scarring is present.  Eventration of the right hemidiaphragm. There is no effusion.  No adenopathy. Coronary artery atherosclerosis is present. If office based assessment of coronary risk factors has not been performed, it is now recommended. No aggressive osseous lesions.  T12 vertebral augmentation.  Mild thoracic spondylosis.  IMPRESSION: 1.  Right cardiophrenic angle opacity on the prior radiograph is accounted for by a prominent pericardial fat pad. 2.  Scattered sub centimeter pulmonary nodules with the largest measuring 5 mm in the right middle lobe. If the patient is at high risk for bronchogenic  carcinoma, follow-up chest CT at 6-12 months is recommended.  If the patient is at low risk for bronchogenic carcinoma, follow-up chest CT at 12 months is recommended.  This recommendation follows the consensus statement: Guidelines for Management of Small Pulmonary Nodules Detected on CT Scans: A Statement from the Fleischner Society as published in Radiology 2005; 237:395-400. 3.  Fatty liver and cholecystectomy. 4.  Coronary artery disease.   Original Report Authenticated By: Andreas Newport, M.D.    Mri Brain Without Contrast  11/16/2011  *RADIOLOGY REPORT*  Clinical  Data: Hypertensive hyperlipidemic patient with memory problems for past few weeks.  Over past 3 days more confused.  No focal deficit.  Renal disease.  MRI HEAD WITHOUT CONTRAST  Technique:  Multiplanar, multiecho pulse sequences of the brain and surrounding structures were obtained according to standard protocol without intravenous contrast.  Comparison: 11/14/2011 CT.  11/11/2008 MR.  Findings: No acute infarct.  No intracranial hemorrhage.  No hydrocephalus.  Minimal small vessel disease type changes.  No intracranial mass lesion detected on this unenhanced exam.  Opacification in the right mastoid air cells.  No obstructing mass noted at the level of the posterior-superior nasopharynx causing eustachian tube dysfunction.  Minimal paranasal sinus mucosal thickening.  Major intracranial vascular structures are patent.  Question mild narrowing of the basilar artery.  IMPRESSION: No acute infarct.  Opacification right mastoid air cells.  Please see above.   Original Report Authenticated By: Fuller Canada, M.D.     Scheduled Meds:    . amitriptyline  75 mg Oral QHS  . azithromycin  500 mg Intravenous Q24H  . cefTRIAXone (ROCEPHIN)  IV  1 g Intravenous Q24H  . febuxostat  40 mg Oral Daily  . folic acid  1 mg Oral Daily  . metoCLOPramide  10 mg Oral BID  . metoprolol tartrate  25 mg Oral BID  . neomycin-polymyxin-hydrocortisone  2 drop Both Ears Q4H  . oxyCODONE  10 mg Oral QID  . pantoprazole  40 mg Oral Q1200  . potassium chloride  20 mEq Oral Daily  . simvastatin  10 mg Oral q1800  . Tofacitinib Citrate  1 tablet Oral Daily   Continuous Infusions:    . sodium chloride 75 mL/hr at 11/15/11 1955    Principal Problem:  *Encephalopathy acute Active Problems:  Community acquired pneumonia  Rheumatoid arthritis  HTN (hypertension)  Hyperlipidemia  Gout  Anxiety    Time spent: 35 minutes    [facility]  [facility] Pager [phone]. If 8PM-8AM, please contact  night-coverage at [email or web address], password [facility] 11/16/2011, 12:38 PM  LOS: 2 days

## 2011-11-16 NOTE — Progress Notes (Signed)
Inpatient Diabetes Program Recommendations  AACE/ADA: New Consensus Statement on Inpatient Glycemic Control (2013)  Target Ranges:  Prepandial:   less than 140 mg/dL      Peak postprandial:   less than 180 mg/dL (1-2 hours)      Critically ill patients:  140 - 180 mg/dL   Reason for Visit: Hyperglycemia and elevated Hbg A1C  Inpatient Diabetes Program Recommendations HgbA1C: Hgb A1C is 7.8 and 8.0  Diet: Request MD change diet order to CHO Modified, medium  Note: Discussed elevated Hgb A1C with Dr. Arthor Captain.  He will inform patient and husband regarding diagnosis of diabetes.  Patient and husband will need basic diabetes education by nursing staff as well as a dietitian consult after diagnosis is discussed.  Nursing staff has been alerted to the new diagnosis.  Will need a prescription for a glucose meter at discharge.  Depending upon mental and physical status at discharge, will need outpatient diabetes education follow-up or Home Health at [facility].

## 2011-11-16 NOTE — Progress Notes (Signed)
Kristina Patel had an elevated bp of 176/99. PT was given scheduled bp medication lopressor. Pt b/p was rechecked and had moved down to 106/78. RN will continue to monitor pt

## 2011-11-17 DIAGNOSIS — F411 Generalized anxiety disorder: Secondary | ICD-10-CM

## 2011-11-17 LAB — BASIC METABOLIC PANEL
BUN: 16 mg/dL (ref 6–23)
Chloride: 106 mEq/L (ref 96–112)
GFR calc Af Amer: 68 mL/min — ABNORMAL LOW (ref >90)
Glucose, Bld: 144 mg/dL — ABNORMAL HIGH (ref 70–99)
Potassium: 3.9 mEq/L (ref 3.5–5.1)
Sodium: 141 mEq/L (ref 135–145)

## 2011-11-17 LAB — GLUCOSE, CAPILLARY: Glucose-Capillary: 148 mg/dL — ABNORMAL HIGH (ref 70–99)

## 2011-11-17 MED ORDER — METFORMIN HCL 500 MG PO TABS
500.0000 mg | ORAL_TABLET | Freq: Two times a day (BID) | ORAL | Status: DC
  Administered 2011-11-17: 500 mg via ORAL
  Filled 2011-11-17 (×2): qty 1

## 2011-11-17 MED ORDER — DIPHENHYDRAMINE HCL 25 MG PO CAPS
25.0000 mg | ORAL_CAPSULE | Freq: Once | ORAL | Status: AC
  Administered 2011-11-17: 25 mg via ORAL
  Filled 2011-11-17: qty 1

## 2011-11-17 MED ORDER — CYCLOBENZAPRINE HCL 10 MG PO TABS
10.0000 mg | ORAL_TABLET | Freq: Three times a day (TID) | ORAL | Status: DC | PRN
  Administered 2011-11-17: 10 mg via ORAL
  Filled 2011-11-17: qty 1

## 2011-11-17 MED ORDER — CEFUROXIME AXETIL 500 MG PO TABS
500.0000 mg | ORAL_TABLET | Freq: Two times a day (BID) | ORAL | Status: DC
  Administered 2011-11-17: 500 mg via ORAL
  Filled 2011-11-17 (×2): qty 1

## 2011-11-17 MED ORDER — CEFUROXIME AXETIL 500 MG PO TABS: 500.0000 mg | ORAL_TABLET | Freq: Two times a day (BID) | ORAL | Status: DC

## 2011-11-17 MED ORDER — METFORMIN HCL 500 MG PO TABS: 500.0000 mg | ORAL_TABLET | Freq: Two times a day (BID) | ORAL | Status: DC

## 2011-11-17 NOTE — Care Management Note (Signed)
    Page 1 of 1   11/17/2011     4:24:46 PM   CARE MANAGEMENT NOTE 11/17/2011  Patient:  Kristina Patel, Kristina Patel   Account Number:  [id number]  Date Initiated:  11/15/2011  Documentation initiated by:  Letha Cape  Subjective/Objective Assessment:   dx encephalopathy, ams, falls  admit- lives with spouse.  pt has rolling walker, bsc and shower chair at home.     Action/Plan:   Anticipated DC Date:  11/17/2011   Anticipated DC Plan:  HOME/SELF CARE      DC Planning Services  CM consult      Choice offered to / List presented to:             Status of service:  Completed, signed off Medicare Important Message given?   (If response is "NO", the following Medicare IM given date fields will be blank) Date Medicare IM given:   Date Additional Medicare IM given:    Discharge Disposition:  HOME/SELF CARE  Per UR Regulation:  Reviewed for med. necessity/level of care/duration of stay  If discussed at Long Length of Stay Meetings, dates discussed:    Comments:  11/17/11 16:23 Letha Cape RN, BSN [phone] patient is for dc today, no needs anticiapated.

## 2011-11-17 NOTE — Progress Notes (Signed)
  RD consulted for nutrition education regarding diabetes.   Lab Results  Component Value Date   HGBA1C 8.0* 11/15/2011   RN stated that pt was aware of DM diagnosis prior to RD visit. 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 Not sure if this true due to pt's confused state. Husband was not with pt at this time.  Pt stated "I need to watch my carbohydrates and portion sizes." Attempted to provide pt with "My Food Plan" handout, but pt stated she already had that at home from when her husband was diagnosed with DM 3 years ago. Pt was not interested in further education at this point. Encouraged pt to contact RD if questions arise.  Body mass index is 37.80 kg/(m^2). Pt meets criteria for Obesity, Class 2 based on current BMI.  Current diet order is CHO modified, medium calorie, patient is consuming approximately 95-100% of meals at this time. Labs and medications reviewed. No further nutrition interventions warranted at this time. RD contact information provided. If additional nutrition issues arise, please re-consult RD.  Leonette Most RD, LDN

## 2011-11-17 NOTE — Progress Notes (Signed)
Pt very confused. PT has called 911 operator stating she needed a ride to another hospital. RN spoke with pt's husband who is here to be with pt and has explained everything to him.

## 2011-11-17 NOTE — Progress Notes (Signed)
Pt's husband states "Pt believes no one is taking her seriously. She's calling everyone. Oh God! I really do believe she has alzheimer's. I've been dealing with her for 5 years like this. Please just give her something so that she can shut up." RN explained to husband that she had paged the MD on call and is waiting for some orders.

## 2011-11-17 NOTE — Progress Notes (Signed)
Pt's husband states "Pt believes no one is taking her seriously. She's calling everyone. Oh God! I really do believe she has alzheimer's. I've been dealing with her for 5 years like this. Please just give her something so that she can shut up." RN explained to husband that she had paged the MD on call and is waiting for some orders.  Oxygen saturation level at 95% on room air.

## 2011-11-17 NOTE — Progress Notes (Signed)
Pt bp was still elevated. Spoke with Md on call. Pt was given another 10 mg of hydralazine IV. Pt bp now down to 152/82. RN will continue to monitor pt.

## 2011-11-17 NOTE — Progress Notes (Signed)
Pt has an elevated bp. Pt missing PRN dose of hydralazine. RN notified pharmacy to send medication.

## 2011-11-17 NOTE — Progress Notes (Signed)
Pt's husband called because pt called him. Pt complains " I'm having an allergic reaction and no one is taking me seriously". RN spoke with pt's husband and explained to him what was going on with the pt, that she as well as the charge nurse had assessed the pt, and that the MD had been paged. RN paged the MD on call and asked for some benadryl for the pt's complaints of itching. RN already put lotion on the pt's bilateral legs and arms. MD ordered 25mg  of Benadryl for pt. RN to give pt 25 mg of benadryl PO.

## 2011-11-17 NOTE — Progress Notes (Signed)
PT complaining of allergic reaction. Pt doesn't have any medications running. Pt is very confused and agitated. Notified Craige Cotta that pt states "throat is swelling, leg swelling, and feels throat is closing". Pt assessed by RN. Pt has no leg swelling. Pt's speech is clear.  MD gave no new orders. -Judy Pimple, RN

## 2011-11-17 NOTE — Consult Note (Signed)
[facility] CONSULT NOTE     Reason for Consult: confusion    HPI:    Kristina Patel is an 66 y.o.   RHWF who has significant anxiety and chronic pain syndrome. She has been seen by pain clinics.  She has significant opiate and benzo dependency, who was started on Vicodin and dilaudid in addition to cymbalta and tolerated for few days then she started getting confused and disoriented. Vicodin was discontinued and was started on oxycontin every 4 hours. And lyrica which made the matters worse. She was on Gabapentin prior to that and had significant episodes of getting lost and confused.   She was recently started on oxycontin and Elavil  who was noted to be having problems with memory for last few weeks. Per home health nurse she actually was highly confused about 2 months ago when she was on Lyrica.  CT and MRI head was negative for any acute stroke or significant white matter changes.  Neurology was consulted for confusion Patient seen at the bedside, she is awake, alert and follows commands. She has significant slowing of the mentation but is not hallucination no delusions, she is appropriate, no foul language or inappropriate behavior, no disinhibition She is tremulousness  Which is chronic.  She has significant anxiety and OCD pattern. She has been on Valium and Xanax.    Past Medical History  Diagnosis Date  . Rheumatoid arthritis   . Hypertension   . Osteoarthritis   . Renal disease   . Degenerative disc disease   . Ovarian cancer   . Uterine cancer   . Melanoma   . Hyperlipidemia     Past Surgical History  Procedure Date  . Cholecystectomy   . Mastectomy, partial   . Tonsillectomy   . Appendectomy   . Right knee replacement   . Operative hysteroscopy   . Back surgery   . Cervical spine surgery     History reviewed. No pertinent family history.  Social History:  reports that she has never smoked. She does not have any smokeless  tobacco history on file. She reports that she does not drink alcohol or use illicit drugs.  Allergies  Allergen Reactions  . Butalbital-Asa-Caff-Codeine (Fiorinal-Codeine) Anaphylaxis and Swelling  . Darvon Other (See Comments)    Irritable & hyper  . Ambien (Zolpidem Tartrate) Other (See Comments)    Sleep walking  . Ativan (Lorazepam) Other (See Comments)    Adverse reaction...made patient "act out"  . Tape Other (See Comments)    Allergic to plastic tape. The adhesive causes redness to skin as well as loss of skin.    Medications:    Prior to Admission:  Prescriptions prior to admission  Medication Sig Dispense Refill  . ALPRAZolam (XANAX) 0.5 MG tablet Take 0.5 mg by mouth every 12 (twelve) hours as needed. For anxiety      . amitriptyline (ELAVIL) 25 MG tablet Take 75 mg by mouth at bedtime.      . Biotin 1000 MCG tablet Take 1,000 mcg by mouth 2 (two) times daily as needed.        . Calcium Carbonate-Vitamin D (CALCIUM 600 + D PO) Take 1 tablet by mouth daily.      . clindamycin (CLEOCIN) 150 MG capsule Take 600 mg by mouth once. One hour prior to dental work      .  cyclobenzaprine (FLEXERIL) 10 MG tablet Take 10 mg by mouth every 8 (eight) hours.       . diclofenac sodium (VOLTAREN) 1 % GEL Apply 4 g topically 4 (four) times daily as needed. To joints for pain      . diphenhydrAMINE (BENADRYL) 25 MG tablet Take 25 mg by mouth as needed. As recommended for itching      . ergocalciferol (VITAMIN D2) 50000 UNITS capsule Take 50,000 Units by mouth once a week.      . febuxostat (ULORIC) 40 MG tablet Take 40 mg by mouth daily.        . Flaxseed, Linseed, (FLAXSEED OIL) 1000 MG CAPS Take 1,000 mg by mouth 4 (four) times daily.       . folic acid (FOLVITE) 1 MG tablet Take 1 mg by mouth daily.      . hydrOXYzine (ATARAX/VISTARIL) 10 MG tablet Take 20 mg by mouth 2 (two) times daily.      Marland Kitchen lidocaine (LIDODERM) 5 % Place 1 patch onto the skin daily as needed. FOR PAIN.    Remove &  Discard patch within 12 hours or as directed by MD      . lidocaine (XYLOCAINE) 5 % ointment Apply 1 application topically as needed. FOR PAIN      . MAGNESIUM OXIDE PO Take 429 mg by mouth 2 (two) times daily.      . metoCLOPramide (REGLAN) 10 MG tablet Take 10 mg by mouth 2 (two) times daily.      . metoprolol tartrate (LOPRESSOR) 25 MG tablet Take 25 mg by mouth 2 (two) times daily.      Marland Kitchen neomycin-polymyxin-hydrocortisone (CORTISPORIN) otic solution Place 2 drops into both ears every 4 (four) hours.      Marland Kitchen omeprazole (PRILOSEC) 20 MG capsule Take 20 mg by mouth at bedtime.      Marland Kitchen oxyCODONE (OXY IR/ROXICODONE) 5 MG immediate release tablet Take 10 mg by mouth 4 (four) times daily.      . potassium chloride (KLOR-CON) 20 MEQ packet Take 20 mEq by mouth daily.      . pravastatin (PRAVACHOL) 20 MG tablet Take 20 mg by mouth at bedtime.        . Tofacitinib Citrate 5 MG TABS Take 1 tablet by mouth every morning.       Scheduled:   . amitriptyline  75 mg Oral QHS  . cefTRIAXone (ROCEPHIN)  IV  1 g Intravenous Q24H  . diphenhydrAMINE  25 mg Oral Once  . febuxostat  40 mg Oral Daily  . folic acid  1 mg Oral Daily  . living well with diabetes book   Does not apply Once  . metoCLOPramide  10 mg Oral BID  . metoprolol tartrate  25 mg Oral BID  . neomycin-polymyxin-hydrocortisone  2 drop Both Ears Q4H  . oxyCODONE  10 mg Oral QID  . pantoprazole  40 mg Oral Q1200  . potassium chloride  20 mEq Oral Daily  . potassium chloride  40 mEq Oral Once  . simvastatin  10 mg Oral q1800  . Tofacitinib Citrate  1 tablet Oral Daily  . DISCONTD: azithromycin  500 mg Intravenous Q24H    Review of Systems - General ROS: negative for - chills, fatigue, fever or hot flashes Hematological and Lymphatic ROS: negative for - bruising, fatigue, jaundice or pallor Endocrine ROS: negative for - hair pattern changes, hot flashes, mood swings or skin changes Respiratory ROS: negative for - cough, hemoptysis,  orthopnea  or wheezing Cardiovascular ROS: negative for - dyspnea on exertion, orthopnea, palpitations or shortness of breath Gastrointestinal ROS: negative for - abdominal pain, appetite loss, blood in stools, diarrhea or hematemesis Musculoskeletal ROS: negative for - joint pain, joint stiffness, joint swelling or muscle pain Neurological ROS: positive for - confusion and memory loss, tremulosness Dermatological ROS: negative for dry skin, pruritus and rash   Blood pressure 140/80, pulse 104, temperature 97.7 F (36.5 C), temperature source Oral, resp. rate 20, height 5\' 3"  (1.6 m), weight 96.8 kg (213 lb 6.5 oz), SpO2 98.00%.   Neurologic Examination:   Mental Status: Alert, oriented, thought content appropriate, hypervigilant and very figidy.   Memory: Poor Thought processing: poor Attention and concentration: poor No hallucinations No delusions No suicidal ideation Mood: Anxious  Speech fluent without evidence of aphasia.  Able to follow 3 step commands without difficulty. Cranial Nerves: II: Discs flat bilaterally; Visual fields grossly normal, pupils equal, round, reactive to light and accommodation III,IV, VI: ptosis not present, extra-ocular motions intact bilaterally V,VII: smile symmetric, facial light touch sensation normal bilaterally VIII: hearing normal bilaterally IX,X: gag reflex present XI: bilateral shoulder shrug XII: midline tongue extension Motor: Right : Upper extremity   5/5    Left:     Upper extremity   5/5  Lower extremity   5/5     Lower extremity   5/5 Tone normal: no cogwheel rigidity No resting tremors  Sensory: Pinprick and light touch intact throughout, bilaterally, Peripheral neuropath from just above bilateral knees to feet and also from wrist to fingers.  Deep Tendon Reflexes: 2+ and symmetric throughout Plantars: Right: downgoing   Left: downgoing Cerebellar: normal finger-to-nose,  normal heel-to-shin test  Romberg:  positive  Extrapyramidal: Tremulousness      Lab Results  Component Value Date/Time   CHOL 182 11/15/2011  6:20 AM    Results for orders placed during the hospital encounter of 11/14/11 (from the past 48 hour(s))  RPR     Status: Normal   Collection Time   11/15/11 12:37 PM      Component Value Range Comment   RPR NON REACTIVE  NON REACTIVE   GLUCOSE, CAPILLARY     Status: Abnormal   Collection Time   11/15/11 12:48 PM      Component Value Range Comment   Glucose-Capillary 240 (*) 70 - 99 mg/dL   GLUCOSE, CAPILLARY     Status: Abnormal   Collection Time   11/15/11  6:15 PM      Component Value Range Comment   Glucose-Capillary 220 (*) 70 - 99 mg/dL   GLUCOSE, CAPILLARY     Status: Abnormal   Collection Time   11/16/11  1:03 AM      Component Value Range Comment   Glucose-Capillary 229 (*) 70 - 99 mg/dL    Comment 1 Documented in Chart      Comment 2 Notify RN     BASIC METABOLIC PANEL     Status: Abnormal   Collection Time   11/16/11  6:35 AM      Component Value Range Comment   Sodium 140  135 - 145 mEq/L    Potassium 3.5  3.5 - 5.1 mEq/L    Chloride 105  96 - 112 mEq/L    CO2 22  19 - 32 mEq/L    Glucose, Bld 180 (*) 70 - 99 mg/dL    BUN 13  6 - 23 mg/dL    Creatinine, Ser 1.61  0.50 -  1.10 mg/dL    Calcium 9.5  8.4 - 16.1 mg/dL    GFR calc non Af Amer 66 (*) >90 mL/min    GFR calc Af Amer 76 (*) >90 mL/min   CBC     Status: Abnormal   Collection Time   11/16/11  6:35 AM      Component Value Range Comment   WBC 8.3  4.0 - 10.5 K/uL    RBC 4.11  3.87 - 5.11 MIL/uL    Hemoglobin 11.5 (*) 12.0 - 15.0 g/dL    HCT 09.6 (*) 04.5 - 46.0 %    MCV 81.0  78.0 - 100.0 fL    MCH 28.0  26.0 - 34.0 pg    MCHC 34.5  30.0 - 36.0 g/dL    RDW 40.9  81.1 - 91.4 %    Platelets 209  150 - 400 K/uL   GLUCOSE, CAPILLARY     Status: Abnormal   Collection Time   11/16/11  8:14 AM      Component Value Range Comment   Glucose-Capillary 184 (*) 70 - 99 mg/dL   GLUCOSE, CAPILLARY      Status: Abnormal   Collection Time   11/16/11 12:09 PM      Component Value Range Comment   Glucose-Capillary 201 (*) 70 - 99 mg/dL   GLUCOSE, CAPILLARY     Status: Abnormal   Collection Time   11/16/11  4:43 PM      Component Value Range Comment   Glucose-Capillary 244 (*) 70 - 99 mg/dL   GLUCOSE, CAPILLARY     Status: Abnormal   Collection Time   11/16/11 10:38 PM      Component Value Range Comment   Glucose-Capillary 182 (*) 70 - 99 mg/dL    Comment 1 Notify RN      Comment 2 Documented in Chart     BASIC METABOLIC PANEL     Status: Abnormal   Collection Time   11/17/11  5:05 AM      Component Value Range Comment   Sodium 141  135 - 145 mEq/L    Potassium 3.9  3.5 - 5.1 mEq/L    Chloride 106  96 - 112 mEq/L    CO2 21  19 - 32 mEq/L    Glucose, Bld 144 (*) 70 - 99 mg/dL    BUN 16  6 - 23 mg/dL    Creatinine, Ser 7.82  0.50 - 1.10 mg/dL    Calcium 95.6  8.4 - 10.5 mg/dL    GFR calc non Af Amer 59 (*) >90 mL/min    GFR calc Af Amer 68 (*) >90 mL/min   GLUCOSE, CAPILLARY     Status: Abnormal   Collection Time   11/17/11  7:47 AM      Component Value Range Comment   Glucose-Capillary 148 (*) 70 - 99 mg/dL     Mri Brain Without Contrast  11/16/2011  *RADIOLOGY REPORT*  Clinical Data: Hypertensive hyperlipidemic patient with memory problems for past few weeks.  Over past 3 days more confused.  No focal deficit.  Renal disease.  MRI HEAD WITHOUT CONTRAST  Technique:  Multiplanar, multiecho pulse sequences of the brain and surrounding structures were obtained according to standard protocol without intravenous contrast.  Comparison: 11/14/2011 CT.  11/11/2008 MR.  Findings: No acute infarct.  No intracranial hemorrhage.  No hydrocephalus.  Minimal small vessel disease type changes.  No intracranial mass lesion detected on this unenhanced exam.  Opacification in  the right mastoid air cells.  No obstructing mass noted at the level of the posterior-superior nasopharynx causing eustachian tube  dysfunction.  Minimal paranasal sinus mucosal thickening.  Major intracranial vascular structures are patent.  Question mild narrowing of the basilar artery.  IMPRESSION: No acute infarct.  Opacification right mastoid air cells.  Please see above.   Original Report Authenticated By: Fuller Canada, M.D.      Assessment/Plan:   66 YO female with 2 month history of agitation and confusion.  Has opiate dependency and with chronic pain which has significant psychogenic component to it.  She has been on multiple narcotics and pain other regimen for pain. She is extremely sensitive to pain medications.  Now on OxyContin and Elavil has made it worse.    Patient does not have seizures.  Differential Diagnosis 1) Toxic Encephalopathy 2) Drug Drug interaction 3) Neurodegenerative disorder  Recommend:  1) Taper Elavil 2) Decrease the frequency of  Oxycontin  3) Neuropsych evaluation as an outpatient 4) Seroquel 12.5 mg at bedtime 5) No benadryl because it also has anticholinergic properties that could make her confusion worse.      Approximately 60 minutes spent with the patient  Reviewing old records, medications, labs and images, in addition to discussing with the home health Personnel.    Zaidy Absher V-P Eilleen Kempf., MD., Ph.D.,MS 11/17/2011 3:19 PM   Felicie Morn PA-C [facility] [phone]  11/17/2011, 11:23 AM

## 2011-11-17 NOTE — Progress Notes (Signed)
Inpatient Diabetes Program Recommendations  AACE/ADA: New Consensus Statement on Inpatient Glycemic Control (2013)  Target Ranges:  Prepandial:   less than 140 mg/dL      Peak postprandial:   less than 180 mg/dL (1-2 hours)      Critically ill patients:  140 - 180 mg/dL   Reason for Visit: New onset DM  Inpatient Diabetes Program Recommendations Correction (SSI): Please check cbg's and use correction scale tidwc as needed. HgbA1C: Hgb A1C is 7.8 and 8.0  Outpatient Referral: Pt/family may need. Diet: Will add carbohydrate modified to present diet orders. (Please order Nutrition consult for education for new onset DM)  Note: Thank you, Lenor Coffin, RN, CNS, Diabetes Coordinator [phone])

## 2011-11-17 NOTE — Discharge Summary (Signed)
PATIENT DETAILS Name: Kristina Patel Age: 66 y.o. Sex: female Date of Birth: 1945-03-24 MRN: [id number]. Admit Date: 11/14/2011 Admitting Physician: Eduard Clos, MD JXB:[id number],OZHYQM M, MD  Recommendations for Outpatient Follow-up:  1. Optimize oral hypoglycemic agents 2. Minimize Narcotics  PRIMARY DISCHARGE DIAGNOSIS:  Principal Problem:  *Encephalopathy acute Active Problems:  Community acquired pneumonia  Rheumatoid arthritis  HTN (hypertension)  Hyperlipidemia  Gout  Anxiety      PAST MEDICAL HISTORY: Past Medical History  Diagnosis Date  . Rheumatoid arthritis   . Hypertension   . Osteoarthritis   . Renal disease   . Degenerative disc disease   . Ovarian cancer   . Uterine cancer   . Melanoma   . Hyperlipidemia     DISCHARGE MEDICATIONS:   Medication List     As of 11/17/2011  4:02 PM    STOP taking these medications         amitriptyline 25 MG tablet   Commonly known as: ELAVIL      clindamycin 150 MG capsule   Commonly known as: CLEOCIN      diphenhydrAMINE 25 MG tablet   Commonly known as: BENADRYL      TAKE these medications         ALPRAZolam 0.5 MG tablet   Commonly known as: XANAX   Take 0.5 mg by mouth every 12 (twelve) hours as needed. For anxiety      Biotin 1000 MCG tablet   Take 1,000 mcg by mouth 2 (two) times daily as needed.      CALCIUM 600 + D PO   Take 1 tablet by mouth daily.      cefUROXime 500 MG tablet   Commonly known as: CEFTIN   Take 1 tablet (500 mg total) by mouth 2 (two) times daily with a meal.      cyclobenzaprine 10 MG tablet   Commonly known as: FLEXERIL   Take 10 mg by mouth every 8 (eight) hours.      diclofenac sodium 1 % Gel   Commonly known as: VOLTAREN   Apply 4 g topically 4 (four) times daily as needed. To joints for pain      ergocalciferol 50000 UNITS capsule   Commonly known as: VITAMIN D2   Take 50,000 Units by mouth once a week.      febuxostat 40 MG tablet   Commonly  known as: ULORIC   Take 40 mg by mouth daily.      Flaxseed Oil 1000 MG Caps   Take 1,000 mg by mouth 4 (four) times daily.      folic acid 1 MG tablet   Commonly known as: FOLVITE   Take 1 mg by mouth daily.      hydrOXYzine 10 MG tablet   Commonly known as: ATARAX/VISTARIL   Take 20 mg by mouth 2 (two) times daily.      lidocaine 5 % ointment   Commonly known as: XYLOCAINE   Apply 1 application topically as needed. FOR PAIN      lidocaine 5 %   Commonly known as: LIDODERM   Place 1 patch onto the skin daily as needed. FOR PAIN.    Remove & Discard patch within 12 hours or as directed by MD      MAGNESIUM OXIDE PO   Take 429 mg by mouth 2 (two) times daily.      metFORMIN 500 MG tablet   Commonly known as: GLUCOPHAGE   Take 1 tablet (500  mg total) by mouth 2 (two) times daily with a meal.      metoCLOPramide 10 MG tablet   Commonly known as: REGLAN   Take 10 mg by mouth 2 (two) times daily.      metoprolol tartrate 25 MG tablet   Commonly known as: LOPRESSOR   Take 25 mg by mouth 2 (two) times daily.      neomycin-polymyxin-hydrocortisone otic solution   Commonly known as: CORTISPORIN   Place 2 drops into both ears every 4 (four) hours.      omeprazole 20 MG capsule   Commonly known as: PRILOSEC   Take 20 mg by mouth at bedtime.      oxyCODONE 5 MG immediate release tablet   Commonly known as: Oxy IR/ROXICODONE   Take 10 mg by mouth 4 (four) times daily.      potassium chloride 20 MEQ packet   Commonly known as: KLOR-CON   Take 20 mEq by mouth daily.      pravastatin 20 MG tablet   Commonly known as: PRAVACHOL   Take 20 mg by mouth at bedtime.      Tofacitinib Citrate 5 MG Tabs   Take 1 tablet by mouth every morning.          BRIEF HPI:  See H&P, Labs, Consult and Test reports for all details in brief,66 year old female with history of rheumatoid arthritis, hypertension, hyperlipidemia has been having problems with memory for last few weeks but since  last 3 days prior to admission,patient has been noticed that she has become more confused and has been repeating words but has not lost consciousness and has not noticed any focal deficits. Patient does not complain of any headache fever chills nausea vomiting or abdominal pain. In the ER patient was found to be mildly confused but hasn't had any focal deficits. CT head was negative for any acute.  CONSULTATIONS:   neurology  PERTINENT RADIOLOGIC STUDIES: Dg Chest 2 View  11/14/2011  *RADIOLOGY REPORT*  Clinical Data: 66 year old female with diabetes and end-stage renal disease with possible fluid collection or abscess about the left upper extremity dialysis graft.  CHEST - 2 VIEW  Comparison: 10/17/2008.  Findings: Seated AP and lateral views of the chest.  Lower lung volumes.  Increased peribronchovascular opacity about the inferior right hilum.  Accentuation of cardiac and mediastinal contours. Visualized tracheal air column is within normal limits.  No pneumothorax.  No overt pulmonary edema.  No pleural effusion. Chronic lower thoracic compression deformities status post augmentation.  Chronic upper lumbar fusion with ray cages.  IMPRESSION: Low lung volumes.  Right lower lobe peribronchovascular opacity could reflect atelectasis or developing pneumonia. No pleural effusion or overt pulmonary edema.   Original Report Authenticated By: Harley Hallmark, M.D.    Ct Head Wo Contrast  11/14/2011  *RADIOLOGY REPORT*  Clinical Data: Altered mental status.  Chronic neuropathy.  CT HEAD WITHOUT CONTRAST  Technique:  Contiguous axial images were obtained from the base of the skull through the vertex without contrast.  Comparison: 11/11/2008  Findings: The brain stem, cerebellum, cerebral peduncles, thalami, basal ganglia, basilar cisterns, and ventricular system appear unremarkable.  No intracranial hemorrhage, mass lesion, or acute infarction is identified.  Chronic right sphenoid sinusitis noted.  Remaining  visualized paranasal sinuses appear clear.  IMPRESSION:  1.  Chronic right sphenoid sinusitis.   Otherwise, no significant abnormality identified.   Original Report Authenticated By: Dellia Cloud, M.D.    Ct Chest Wo Contrast  11/15/2011  *  RADIOLOGY REPORT*  Clinical Data: Right-sided pulmonary opacity.  Hemoptysis.  CT CHEST WITHOUT CONTRAST  Technique:  Multidetector CT imaging of the chest was performed following the standard protocol without IV contrast.  Comparison: Chest radiograph 11/14/2011.  Findings: There is no airspace disease in the lungs.  The opacity at the right cardiophrenic angle represents a prominent pericardial fat pad.  Dependent atelectasis is present in the lungs.  Scattered sub centimeter pulmonary nodules are present.  The largest of these measures 5 mm in the right middle lobe (image number 30 series 3). Follow-up is required.  Incidental imaging of the upper abdomen demonstrates cholecystectomy and fatty liver.  Renal parenchymal scarring is present.  Eventration of the right hemidiaphragm. There is no effusion.  No adenopathy. Coronary artery atherosclerosis is present. If office based assessment of coronary risk factors has not been performed, it is now recommended. No aggressive osseous lesions.  T12 vertebral augmentation.  Mild thoracic spondylosis.  IMPRESSION: 1.  Right cardiophrenic angle opacity on the prior radiograph is accounted for by a prominent pericardial fat pad. 2.  Scattered sub centimeter pulmonary nodules with the largest measuring 5 mm in the right middle lobe. If the patient is at high risk for bronchogenic carcinoma, follow-up chest CT at 6-12 months is recommended.  If the patient is at low risk for bronchogenic carcinoma, follow-up chest CT at 12 months is recommended.  This recommendation follows the consensus statement: Guidelines for Management of Small Pulmonary Nodules Detected on CT Scans: A Statement from the Fleischner Society as published in  Radiology 2005; 237:395-400. 3.  Fatty liver and cholecystectomy. 4.  Coronary artery disease.   Original Report Authenticated By: Andreas Newport, M.D.    Mri Brain Without Contrast  11/16/2011  *RADIOLOGY REPORT*  Clinical Data: Hypertensive hyperlipidemic patient with memory problems for past few weeks.  Over past 3 days more confused.  No focal deficit.  Renal disease.  MRI HEAD WITHOUT CONTRAST  Technique:  Multiplanar, multiecho pulse sequences of the brain and surrounding structures were obtained according to standard protocol without intravenous contrast.  Comparison: 11/14/2011 CT.  11/11/2008 MR.  Findings: No acute infarct.  No intracranial hemorrhage.  No hydrocephalus.  Minimal small vessel disease type changes.  No intracranial mass lesion detected on this unenhanced exam.  Opacification in the right mastoid air cells.  No obstructing mass noted at the level of the posterior-superior nasopharynx causing eustachian tube dysfunction.  Minimal paranasal sinus mucosal thickening.  Major intracranial vascular structures are patent.  Question mild narrowing of the basilar artery.  IMPRESSION: No acute infarct.  Opacification right mastoid air cells.  Please see above.   Original Report Authenticated By: Fuller Canada, M.D.      PERTINENT LAB RESULTS: CBC:  Basename 11/16/11 0635 11/14/11 1608  WBC 8.3 8.4  HGB 11.5* 11.9*  HCT 33.3* 34.5*  PLT 209 216   CMET CMP     Component Value Date/Time   NA 141 11/17/2011 0505   K 3.9 11/17/2011 0505   CL 106 11/17/2011 0505   CO2 21 11/17/2011 0505   GLUCOSE 144* 11/17/2011 0505   BUN 16 11/17/2011 0505   CREATININE 0.99 11/17/2011 0505   CALCIUM 10.1 11/17/2011 0505   PROT 7.0 11/14/2011 1608   ALBUMIN 3.5 11/14/2011 1608   AST 18 11/14/2011 1608   ALT 27 11/14/2011 1608   ALKPHOS 142* 11/14/2011 1608   BILITOT 0.2* 11/14/2011 1608   GFRNONAA 59* 11/17/2011 0505   GFRAA 68* 11/17/2011 0505  GFR Estimated Creatinine Clearance: 62.8 ml/min (by  C-G formula based on Cr of 0.99). No results found for this basename: LIPASE:2,AMYLASE:2 in the last 72 hours  Basename 11/15/11 0058 11/14/11 1609  CKTOTAL -- --  CKMB -- --  CKMBINDEX -- --  TROPONINI <0.30 <0.30   No components found with this basename: POCBNP:3 No results found for this basename: DDIMER:2 in the last 72 hours  Basename 11/15/11 0620 11/15/11 0103  HGBA1C 8.0* 7.8*    Basename 11/15/11 0620  CHOL 182  HDL 49  LDLCALC 76  TRIG 283*  CHOLHDL 3.7  LDLDIRECT --    Basename 11/15/11 0103  TSH 1.133  T4TOTAL --  T3FREE --  THYROIDAB --    Basename 11/15/11 0103  VITAMINB12 519  FOLATE >20.0  FERRITIN --  TIBC --  IRON --  RETICCTPCT --   Coags: No results found for this basename: PT:2,INR:2 in the last 72 hours Microbiology: No results found for this or any previous visit (from the past 240 hour(s)).   BRIEF HOSPITAL COURSE:    Principal Problem:  *Acute Encephalopathy -etiology unclear-but likely 2/2 to medications-namely Elavil, Narcotics and Benadryl. No fever or any signs of meningitis on exam. -MRI brain negative, seen by Neuro-to stop/Taper Elavil, QTc slightly prolonged-therefore will stop Elavil and not start Seroquel. Patient see's Pain MD as outpatient-will for now defer further adjustment of her narcotics to her pain MD. For now will just stop Elavil and assess clinical response.  -In any event-patient is much better than on admission, following all commands, husband at bedside-claiming much better than last few days. -Neuro also recommending outpatient Neuropsych eval-therefore referring [facility] to [facility] as [facility]. -Although initially thought to have PNA on CXR, CT Chest did not show any PNA.  Active Problems: Questionable pneumonia -not seen on CT chest -continued on Rocephin-as there was suspicion of acute bronchitis-will transition to Ceftin on discharge  Rheumatoid arthritis  -Continue her arthritis medications  includingTofacitinib  New Onset DM -start Metformin -diabetic education done while inpatient  Right ear ache  -No obvious otitis externa, MRI showed opacification of the air cells.  -she was on  Rocephin for her bronchitis, which will be transitioned to Ceftin on discharge  Chronic Pain Syndrome -follow up with Pain MD  -c/w Narcotics as usual on discharge   TODAY-DAY OF DISCHARGE:  Subjective:   Kristina Patel today has no headache,no chest abdominal pain,no new weakness tingling or numbness, feels much better wants to go home today.   Objective:   Blood pressure 162/87, pulse 100, temperature 98.3 F (36.8 C), temperature source Oral, resp. rate 18, height 5\' 3"  (1.6 m), weight 96.8 kg (213 lb 6.5 oz), SpO2 98.00%.  Intake/Output Summary (Last 24 hours) at 11/17/11 1602 Last data filed at 11/17/11 0700  Gross per 24 hour  Intake    220 ml  Output   1550 ml  Net  -1330 ml    Exam Awake Alert, Oriented *3, No new F.N deficits, Normal affect [place].AT,PERRAL Supple Neck,No JVD, No cervical lymphadenopathy appriciated.  Symmetrical Chest wall movement, Good air movement bilaterally, CTAB RRR,No Gallops,Rubs or new Murmurs, No Parasternal Heave +ve B.Sounds, Abd Soft, Non tender, No organomegaly appriciated, No rebound -guarding or rigidity. No Cyanosis, Clubbing or edema, No new Rash or bruise  DISCHARGE CONDITION: Stable  DISPOSITION:  HOME  DISCHARGE INSTRUCTIONS:    Activity:  As tolerated with Full fall precautions use walker/cane & assistance as needed  Diet recommendation: Diabetic Diet Heart  Healthy diet  Follow-up Information    Follow up with Jearld Lesch, MD. Schedule an appointment as soon as possible for a visit in 2 weeks.   Contact information:   [address] [place] [address] [place] [address] [phone]       Follow up with [facility]. Schedule an appointment as soon as possible for a visit in 2 weeks.   Contact  information:   [address] [place], [place] [address] [phone]           Total Time spent on discharge equals 45 minutes.  SignedJeoffrey Massed 11/17/2011 4:02 PM

## 2011-11-17 NOTE — Progress Notes (Signed)
PT complaining of allergic reaction. Pt doesn't have any medications running. Pt is very confused and agitated. Notified Craige Cotta that pt states "throat is swelling, leg swelling, and feels throat is closing". Pt assessed by RN. Pt has no leg swelling. MD gave no new orders. -Judy Pimple, RN

## 2011-11-17 NOTE — Progress Notes (Signed)
Pt given 10 mg of hydralazine IV. Pt bp rechecked and moved up to 185/96. RN paged MD on call. Pt complaining of pain. Pt given 2mg  of morphine. RN will recheck bp in a little while.

## 2012-02-24 ENCOUNTER — Other Ambulatory Visit: Payer: Self-pay | Admitting: Vascular Surgery

## 2012-02-28 ENCOUNTER — Encounter: Payer: Self-pay | Admitting: Vascular Surgery

## 2012-02-28 ENCOUNTER — Other Ambulatory Visit: Payer: Self-pay | Admitting: *Deleted

## 2012-02-28 DIAGNOSIS — I739 Peripheral vascular disease, unspecified: Secondary | ICD-10-CM

## 2012-03-22 ENCOUNTER — Encounter: Payer: Self-pay | Admitting: Vascular Surgery

## 2012-03-23 ENCOUNTER — Encounter: Payer: Medicare Other | Admitting: Vascular Surgery

## 2012-04-21 ENCOUNTER — Encounter: Payer: Self-pay | Admitting: Surgery

## 2012-04-24 ENCOUNTER — Encounter: Payer: Self-pay | Admitting: Surgery

## 2012-04-24 ENCOUNTER — Encounter (INDEPENDENT_AMBULATORY_CARE_PROVIDER_SITE_OTHER): Payer: Medicare Other | Admitting: *Deleted

## 2012-04-24 ENCOUNTER — Ambulatory Visit (INDEPENDENT_AMBULATORY_CARE_PROVIDER_SITE_OTHER): Payer: Medicare Other | Admitting: Surgery

## 2012-04-24 VITALS — BP 150/87 | HR 84 | Ht 63.0 in | Wt 180.8 lb

## 2012-04-24 DIAGNOSIS — M7989 Other specified soft tissue disorders: Secondary | ICD-10-CM

## 2012-04-24 DIAGNOSIS — I739 Peripheral vascular disease, unspecified: Secondary | ICD-10-CM

## 2012-04-24 NOTE — Progress Notes (Signed)
[facility] [place]   Patient name: Kristina Patel MRN: [id number] DOB: 1945/08/13 Sex: female   Referred by: Dr. Mayford Knife  Reason for referral:  Chief Complaint  Patient presents with  . New Evaluation    claudication(appearance of PVD) Dr. Lerry Liner - pt c/o pain in both legs X3 months    HISTORY OF PRESENT ILLNESS: This is a very pleasant 67 year old female who is referred for evaluation of painful bilateral lower extremities. She states that she has been having problems with swelling for approximately 2-3 months. She began getting blisters and knots on her legs which became very painful. This pain keeps her up at night. She complains of a drawing -like sensation around her ankle, particularly on the left which is very painful.  The patient suffers from rheumatoid arthritis. She has tried multiple medications. She has recently been diagnosed with borderline diabetes for which she is now on medication for.  Past Medical History  Diagnosis Date  . Rheumatoid arthritis   . Hypertension   . Osteoarthritis   . Renal disease   . Degenerative disc disease   . Ovarian cancer   . Uterine cancer   . Melanoma   . Hyperlipidemia     Past Surgical History  Procedure Laterality Date  . Cholecystectomy    . Mastectomy, partial    . Tonsillectomy    . Appendectomy    . Right knee replacement    . Operative hysteroscopy    . Back surgery    . Cervical spine surgery    . Abdominal hysterectomy      partial  . Breast surgery Bilateral     benign fatty tumor removed  . Carpal tunnel release Right     History   Social History  . Marital Status: Married    Spouse Name: N/A    Number of Children: N/A  . Years of Education: N/A   Occupational History  . Not on file.   Social History Main Topics  . Smoking status: Never Smoker   . Smokeless tobacco: Not on file  . Alcohol Use: No  . Drug Use: No  . Sexually Active:    Other Topics Concern  .  Not on file   Social History Narrative  . No narrative on file    History reviewed. No pertinent family history.  Allergies as of 04/24/2012 - Review Complete 04/24/2012  Allergen Reaction Noted  . Butalbital-asa-caff-codeine (fiorinal-codeine) Anaphylaxis and Swelling 01/19/2011  . Darvon Other (See Comments) 01/19/2011  . Ambien (zolpidem tartrate) Other (See Comments) 01/19/2011  . Ativan (lorazepam) Other (See Comments) 01/19/2011  . Fiorinal (butalbital-aspirin-caffeine)  04/24/2012  . Tape Other (See Comments) 01/19/2011    Current Outpatient Prescriptions on File Prior to Visit  Medication Sig Dispense Refill  . ALPRAZolam (XANAX) 0.5 MG tablet Take 0.5 mg by mouth every 12 (twelve) hours as needed. For anxiety      . Calcium Carbonate-Vitamin D (CALCIUM 600 + D PO) Take 1 tablet by mouth daily.      . cyclobenzaprine (FLEXERIL) 10 MG tablet Take 10 mg by mouth every 8 (eight) hours.       . febuxostat (ULORIC) 40 MG tablet Take 40 mg by mouth daily.        . Flaxseed, Linseed, (FLAXSEED OIL) 1000 MG CAPS Take 1,000 mg by mouth 4 (four) times daily.       . hydrOXYzine (ATARAX/VISTARIL) 10 MG tablet Take 20 mg by mouth 2 (two)  times daily.      Marland Kitchen lidocaine (LIDODERM) 5 % Place 1 patch onto the skin daily as needed. FOR PAIN.    Remove & Discard patch within 12 hours or as directed by MD      . lidocaine (XYLOCAINE) 5 % ointment Apply 1 application topically as needed. FOR PAIN      . metFORMIN (GLUCOPHAGE) 500 MG tablet Take 1 tablet (500 mg total) by mouth 2 (two) times daily with a meal.  60 tablet  0  . metoCLOPramide (REGLAN) 10 MG tablet Take 10 mg by mouth 2 (two) times daily.      . metoprolol tartrate (LOPRESSOR) 25 MG tablet Take 25 mg by mouth 2 (two) times daily.      Marland Kitchen omeprazole (PRILOSEC) 20 MG capsule Take 20 mg by mouth at bedtime.      . potassium chloride (KLOR-CON) 20 MEQ packet Take 20 mEq by mouth daily.      . pravastatin (PRAVACHOL) 20 MG tablet Take 20  mg by mouth at bedtime.        . Tofacitinib Citrate 5 MG TABS Take 1 tablet by mouth every morning.      . Biotin 1000 MCG tablet Take 1,000 mcg by mouth 2 (two) times daily as needed.        . cefUROXime (CEFTIN) 500 MG tablet Take 1 tablet (500 mg total) by mouth 2 (two) times daily with a meal.  8 tablet  0  . diclofenac sodium (VOLTAREN) 1 % GEL Apply 4 g topically 4 (four) times daily as needed. To joints for pain      . ergocalciferol (VITAMIN D2) 50000 UNITS capsule Take 50,000 Units by mouth once a week.      . folic acid (FOLVITE) 1 MG tablet Take 1 mg by mouth daily.      Marland Kitchen MAGNESIUM OXIDE PO Take 429 mg by mouth 2 (two) times daily.      Marland Kitchen neomycin-polymyxin-hydrocortisone (CORTISPORIN) otic solution Place 2 drops into both ears every 4 (four) hours.      Marland Kitchen oxyCODONE (OXY IR/ROXICODONE) 5 MG immediate release tablet Take 10 mg by mouth 4 (four) times daily.       No current facility-administered medications on file prior to visit.     REVIEW OF SYSTEMS: Cardiovascular: No chest pain, chest pressure.  Positive for pain in legs with walking and lying flat. Positive for swelling in her legs. Pulmonary: No productive cough, asthma or wheezing. Neurologic: No weakness, paresthesias, aphasia, or amaurosis. No dizziness. Hematologic: No bleeding problems or clotting disorders. Musculoskeletal: No joint pain or joint swelling. Gastrointestinal: No blood in stool or hematemesis Genitourinary: Positive for blood in her urine Psychiatric:: No history of major depression. Integumentary: No rashes or ulcers. Constitutional: No fever or chills.  PHYSICAL EXAMINATION: General: The patient appears their stated age.  Vital signs are BP 150/87  Pulse 84  Ht 5\' 3"  (1.6 m)  Wt 180 lb 12.8 oz (82.01 kg)  BMI 32.04 kg/m2  SpO2 98% HEENT:  No gross abnormalities Pulmonary: Respirations are non-labored Abdomen: Soft and non-tender  Musculoskeletal: There are no major deformities.    Neurologic: No focal weakness or paresthesias are detected, Skin: There are no ulcer or rashes noted. Psychiatric: The patient has normal affect. Cardiovascular: There is a regular rate and rhythm without significant murmur appreciated. No carotid bruits. Palpable pedal pulses bilaterally  Diagnostic Studies: ABIs were checked today. They were 1.1 on the right and 1.1 on  the left with triphasic waveforms    Assessment:  Bilateral leg pain and swelling Plan: Palpable pulses and normal ankle-brachial indices, I do not feel that arterial insufficiency is the underlying cause her problems. She seems to complain of a lot of powder swelling. This could potentially either be related to venous insufficiency or lymphedema. Regardless, the treatment initially would be placing her into compression stockings. She states that she has a thigh-high compression stockings at home which she wears routinely. I'm unsure of the level of compression, and therefore I'm giving her a prescription for 20-30 mm gradient compression stockings, thigh high. I will have her back to the office in 3 months for a venous insufficiency workup.     Jorge Ny, M.D. [facility] [place] Office: [phone] Pager:  [phone]

## 2012-04-24 NOTE — Addendum Note (Signed)
Addended by: Sharee Pimple on: 04/24/2012 02:31 PM   Modules accepted: Orders

## 2012-08-04 ENCOUNTER — Encounter: Payer: Self-pay | Admitting: Surgery

## 2012-08-07 ENCOUNTER — Ambulatory Visit: Payer: Medicare Other | Admitting: Surgery

## 2012-09-20 ENCOUNTER — Encounter: Payer: Self-pay | Admitting: Family Medicine

## 2012-11-06 ENCOUNTER — Encounter: Payer: Self-pay | Admitting: Family Medicine

## 2012-11-07 ENCOUNTER — Encounter: Payer: Self-pay | Admitting: Family Medicine

## 2012-12-06 ENCOUNTER — Other Ambulatory Visit (HOSPITAL_COMMUNITY): Payer: Self-pay | Admitting: Neurosurgery

## 2012-12-06 DIAGNOSIS — M47812 Spondylosis without myelopathy or radiculopathy, cervical region: Secondary | ICD-10-CM

## 2012-12-13 ENCOUNTER — Encounter: Payer: Self-pay | Admitting: Family Medicine

## 2012-12-14 ENCOUNTER — Ambulatory Visit (HOSPITAL_COMMUNITY): Payer: Medicare Other

## 2013-01-24 ENCOUNTER — Ambulatory Visit (INDEPENDENT_AMBULATORY_CARE_PROVIDER_SITE_OTHER): Payer: Medicare Other | Admitting: Neurology

## 2013-01-24 ENCOUNTER — Encounter: Payer: Self-pay | Admitting: Neurology

## 2013-01-24 VITALS — BP 148/89 | HR 91 | Ht 63.25 in | Wt 209.0 lb

## 2013-01-24 DIAGNOSIS — G459 Transient cerebral ischemic attack, unspecified: Secondary | ICD-10-CM

## 2013-01-24 NOTE — Patient Instructions (Signed)
Overall you are doing fairly well but I do want to suggest a few things today:   We are going to run some tests to make sure you are not having multiple TIAs. These are similar to mini strokes.   As far as your medications are concerned, I would like to suggest starting a daily aspirin 81mg .   We will check a brain MRI and carotid dopplers.   We will call you to discuss the results of your tests.   My clinical assistant and will answer any of your questions and relay your messages to me and also relay most of my messages to you.   Our phone number is [phone]. We also have an after hours call service for urgent matters and there is a physician on-call for urgent questions. For any emergencies you know to call 911 or go to the nearest emergency room

## 2013-01-24 NOTE — Progress Notes (Signed)
[facility]    Provider:  Dr Hosie Poisson Referring Provider: Jearld Lesch, MD Primary Care Physician:  Jearld Lesch, MD  CC:  Pain and sensory changes  HPI:  Kristina Patel is a 67 y.o. female here as a referral from Dr. Mayford Knife for question of TIA with history of left sided sensory changes.  Patient has chronic history of multiple pain concerns but has had new symptoms concerning for possible TIA. In the past few weeks has had multiple episodes of sensory loss in the left lip and periorbital region, people notice her speech is different. These episodes can last 5 to 10 minutes. She is unsure if that side of her face is drooping. During these episodes get weak in her arm and will lose sensation. Sensory changes start in the shoulder and radiate down, involves all fingers but predominantly seem to involve the 3rd thru 5th digits. These episodes are typically happening 4-5 times a week. Has history of DM, HTN, HLD. Does not take a daily aspirin. No history of stroke or TIA.   Followed by pain clinic, had steroid injections done a few weeks ago, feels this has actually worsened her condition.    Review of Systems: Out of a complete 14 system review, the patient complains of only the following symptoms, and all other reviewed systems are negative. Positive headache numbness weakness snoring feeling cold joint pain joint swelling cramps aching muscles swelling in legs decreased energy urinary problems  History   Social History  . Marital Status: Married    Spouse Name: Amada Jupiter    Number of Children: 2  . Years of Education: 16   Occupational History  . Not on file.   Social History Main Topics  . Smoking status: Never Smoker   . Smokeless tobacco: Never Used  . Alcohol Use: No  . Drug Use: No  . Sexual Activity: Not on file   Other Topics Concern  . Not on file   Social History Narrative   Patient is married Amada Jupiter) and lives with her husband.   Patient has two children.   Patient is retired.   Patient has a college education.   Patient is right handed.   Patient drinks 3-4 cups of coffee and tea daily.    No family history on file.  Past Medical History  Diagnosis Date  . Rheumatoid arthritis(714.0)   . Hypertension   . Osteoarthritis   . Renal disease   . Degenerative disc disease   . Ovarian cancer   . Uterine cancer   . Melanoma   . Hyperlipidemia     Past Surgical History  Procedure Laterality Date  . Cholecystectomy    . Mastectomy, partial    . Tonsillectomy    . Appendectomy    . Right knee replacement    . Operative hysteroscopy    . Back surgery    . Cervical spine surgery    . Abdominal hysterectomy      partial  . Breast surgery Bilateral     benign fatty tumor removed  . Carpal tunnel release Right     Current Outpatient Prescriptions  Medication Sig Dispense Refill  . ALPRAZolam (XANAX) 0.5 MG tablet Take 0.5 mg by mouth every 12 (twelve) hours as needed. For anxiety      . baclofen (LIORESAL) 10 MG tablet Take 1 tablet by mouth 3 (three) times daily.      . Biotin 1000 MCG tablet Take 1,000 mcg by mouth 2 (two)  times daily as needed.        . Calcium Carbonate-Vitamin D (CALCIUM 600 + D PO) Take 1 tablet by mouth daily.      . ergocalciferol (VITAMIN D2) 50000 UNITS capsule Take 50,000 Units by mouth once a week.      . Flaxseed, Linseed, (FLAXSEED OIL) 1000 MG CAPS Take 1,000 mg by mouth 4 (four) times daily.       Marland Kitchen glipiZIDE (GLUCOTROL XL) 5 MG 24 hr tablet Take 5 mg by mouth 2 (two) times daily.      Marland Kitchen HYDROmorphone (DILAUDID) 4 MG tablet Take 1 tablet by mouth every 4 (four) hours as needed.      . indapamide (LOZOL) 2.5 MG tablet Take 2.5 mg by mouth every morning.      . lidocaine (LIDODERM) 5 % Place 1 patch onto the skin daily as needed. FOR PAIN.    Remove & Discard patch within 12 hours or as directed by MD      . lidocaine (XYLOCAINE) 5 % ointment Apply 1 application topically as  needed. FOR PAIN      . LYRICA 50 MG capsule 2 (two) times daily.      Marland Kitchen MAGNESIUM OXIDE PO Take 429 mg by mouth 2 (two) times daily.      . metFORMIN (GLUCOPHAGE) 500 MG tablet Take 1 tablet (500 mg total) by mouth 2 (two) times daily with a meal.  60 tablet  0  . metoCLOPramide (REGLAN) 10 MG tablet Take 10 mg by mouth 2 (two) times daily.      . metoprolol tartrate (LOPRESSOR) 25 MG tablet Take 25 mg by mouth 2 (two) times daily.      . nitrofurantoin, macrocrystal-monohydrate, (MACROBID) 100 MG capsule Take 100 mg by mouth 2 (two) times daily.      . ondansetron (ZOFRAN) 8 MG tablet Take by mouth every 8 (eight) hours as needed for nausea.      . pantoprazole (PROTONIX) 40 MG tablet Take 40 mg by mouth daily.      . potassium chloride (KLOR-CON) 20 MEQ packet Take 20 mEq by mouth daily.      . Pseudoephedrine HCl (SUDAFED PO) Take 1 tablet by mouth as needed.      . Tofacitinib Citrate 5 MG TABS Take 1 tablet by mouth every morning.      . traZODone (DESYREL) 50 MG tablet Take 1 tablet by mouth at bedtime.       No current facility-administered medications for this visit.    Allergies as of 01/24/2013 - Review Complete 01/24/2013  Allergen Reaction Noted  . Butalbital-asa-caff-codeine [fiorinal-codeine] Anaphylaxis and Swelling 01/19/2011  . Darvon Other (See Comments) 01/19/2011  . Ambien [zolpidem tartrate] Other (See Comments) 01/19/2011  . Ativan [lorazepam] Other (See Comments) 01/19/2011  . Fiorinal [butalbital-aspirin-caffeine]  04/24/2012  . Penicillins  01/24/2013  . Tape Other (See Comments) 01/19/2011    Vitals: BP 148/89  Pulse 91  Ht 5' 3.25" (1.607 m)  Wt 209 lb (94.802 kg)  BMI 36.71 kg/m2 Last Weight:  Wt Readings from Last 1 Encounters:  01/24/13 209 lb (94.802 kg)   Last Height:   Ht Readings from Last 1 Encounters:  01/24/13 5' 3.25" (1.607 m)     Physical exam: Exam: Gen: NAD, conversant Eyes: anicteric sclerae, moist conjunctivae HENT:  Atraumatic, oropharynx clear Neck: Trachea midline; supple,  Lungs: CTA, no wheezing, rales, rhonic  CV: RRR, no MRG Abdomen: Soft, non-tender;  Extremities: No peripheral edema  Skin: Normal temperature, no rash,  Psych: Appropriate affect, pleasant  Neuro: MS: AA&Ox3, appropriately interactive, normal affect   Speech: fluent w/o paraphasic error  Memory: good recent and remote recall  CN: PERRL, EOMI no nystagmus, no ptosis, sensation intact to LT V1-V3 bilat, face symmetric, no weakness, hearing grossly intact, palate elevates symmetrically, shoulder shrug 5/5 bilat,  tongue protrudes midline, no fasiculations noted.  Motor: normal bulk and tone Strength: 5/5  In all extremities with noted giveaway weakness in the left upper and lower extremity  Coord: rapid alternating and point-to-point (FNF, HTS) movements intact.  Reflexes: symmetrical, bilat downgoing toes  Sens: Decreased light touch in the left upper extremity appears somewhat to be in the C8 dermatomal distribution otherwise sensation intact in all extremities  Gait: posture, stance, stride and arm-swing normal. Tandem gait intact. Able to walk on heels and toes. Romberg absent.   Assessment:  After physical and neurologic examination, review of laboratory studies, imaging, neurophysiology testing and pre-existing records, assessment will be reviewed on the problem list.  Plan:  Treatment plan and additional workup will be reviewed under Problem List.  1)Sensory loss 2)paresthesias 3)Chronic pain  67 year old woman presenting for intermittent recurrent episodes of left-sided sensory changes. Based on description the symptoms are not typical for a transient ischemic attack but this cannot be ruled out. Therefore will check brain MRI and carotid Doppler. Started patient to start aspirin 81 mg daily. If this is unremarkable would consider doing EMG nerve conduction study of her left upper  extremity as her symptoms are more consistent with a possible cervical radiculopathy. Followup once workup completed.    Elspeth Cho, DO  [facility] [address] [place], [place] [address]  Phone [phone] Fax [phone]

## 2013-02-22 LAB — HM COLONOSCOPY

## 2013-03-14 ENCOUNTER — Telehealth: Payer: Self-pay | Admitting: Neurology

## 2013-03-14 ENCOUNTER — Telehealth: Payer: Self-pay | Admitting: Radiology

## 2013-03-14 NOTE — Telephone Encounter (Signed)
Patient would like to see Dr. Janann Colonel this week for pain the back of her neck which has lasted 3 days and is causing headaches. Please call patient to schedule.

## 2013-03-15 NOTE — Telephone Encounter (Signed)
Please advise 

## 2013-03-15 NOTE — Telephone Encounter (Signed)
Patient scheduled for 1100 am Friday morning. She will be here at 1045.

## 2013-03-15 NOTE — Telephone Encounter (Signed)
Please offer her Friday at 11am or 2:30. Thanks.

## 2013-03-16 ENCOUNTER — Encounter: Payer: Self-pay | Admitting: Neurology

## 2013-03-16 ENCOUNTER — Encounter (INDEPENDENT_AMBULATORY_CARE_PROVIDER_SITE_OTHER): Payer: Self-pay

## 2013-03-16 ENCOUNTER — Ambulatory Visit (INDEPENDENT_AMBULATORY_CARE_PROVIDER_SITE_OTHER): Payer: Medicare Other | Admitting: Neurology

## 2013-03-16 VITALS — BP 143/88 | HR 93 | Ht 62.0 in | Wt 196.0 lb

## 2013-03-16 DIAGNOSIS — M5412 Radiculopathy, cervical region: Secondary | ICD-10-CM

## 2013-03-16 DIAGNOSIS — R209 Unspecified disturbances of skin sensation: Secondary | ICD-10-CM

## 2013-03-16 DIAGNOSIS — R202 Paresthesia of skin: Secondary | ICD-10-CM

## 2013-03-16 DIAGNOSIS — M5416 Radiculopathy, lumbar region: Secondary | ICD-10-CM

## 2013-03-16 DIAGNOSIS — G459 Transient cerebral ischemic attack, unspecified: Secondary | ICD-10-CM

## 2013-03-16 DIAGNOSIS — IMO0002 Reserved for concepts with insufficient information to code with codable children: Secondary | ICD-10-CM

## 2013-03-16 DIAGNOSIS — M5010 Cervical disc disorder with radiculopathy, unspecified cervical region: Secondary | ICD-10-CM

## 2013-03-16 NOTE — Progress Notes (Signed)
[facility]    Provider:  Dr Janann Colonel Referring Provider: Harvie Junior, MD Primary Care Physician:  Harvie Junior, MD  CC:  Pain and sensory changes  HPI:  Kristina Patel is a 68 y.o. female here as a follow up from Dr. Jimmye Norman for question of TIA with history of left sided sensory changes. Initial visit was 01/2013 at which time her symptoms were consistent with a diagnosis of cervical radiculopathy vs less likely TIA. She was instructed to get a MRI of the brain and carotid ultrasound. If this was normal was planning to check EMG/NCS. Returns today with concerns of worsening back and neck pain. Reports symptoms are mainly in the cervical region, radiates up through bilateral occipital region. Reports symptoms are constant but will fluctuate throughout the day. No triggering or alleviating factors. Currently taking dilaudid for symptomatic relief, takes it every 4-6hours. Notes some swelling and pins and needles type sensation in bilateral hands. Notes some loss of sensation in bilateral LE L>R. Starts at lower back and radiates down the posterior surface of the leg. She last saw the pain clinic in December, they suggested PT for the cervical spine and possible steroid injections for the lumbar spine. Scheduled to see them again in February for injections, PT referral is pending. MRI brain scheduled for this weekend.  MRI cervical spine from 01/2013 reviewed and shows multilevel degenerative changes with foraminal narrowing at C4-5, C5-6 and C6-7, no signs of cord compression   Prior visit 01/2013: Patient has chronic history of multiple pain concerns but has had new symptoms concerning for possible TIA. In the past few weeks has had multiple episodes of sensory loss in the left lip and periorbital region, people notice her speech is different. These episodes can last 5 to 10 minutes. She is unsure if that side of her face is drooping. During these episodes get  weak in her arm and will lose sensation. Sensory changes start in the shoulder and radiate down, involves all fingers but predominantly seem to involve the 3rd thru 5th digits. These episodes are typically happening 4-5 times a week. Has history of DM, HTN, HLD. Does not take a daily aspirin. No history of stroke or TIA.   Followed by pain clinic, had steroid injections done a few weeks ago, feels this has actually worsened her condition.    Review of Systems: Out of a complete 14 system review, the patient complains of only the following symptoms, and all other reviewed systems are negative. Positive positive for excessive thirst back pain next if this excessive sweating  History   Social History  . Marital Status: Married    Spouse Name: [name]    Number of Children: 2  . Years of Education: 16   Occupational History  . Not on file.   Social History Main Topics  . Smoking status: Never Smoker   . Smokeless tobacco: Never Used  . Alcohol Use: No  . Drug Use: No  . Sexual Activity: Not on file   Other Topics Concern  . Not on file   Social History Narrative   Patient is married [name]) and lives with her husband.   Patient has two children.   Patient is retired.   Patient has a college education.   Patient is right handed.   Patient drinks 3-4 cups of coffee and tea daily.    No family history on file.  Past Medical History  Diagnosis Date  . Rheumatoid arthritis(714.0)   .  Hypertension   . Osteoarthritis   . Renal disease   . Degenerative disc disease   . Ovarian cancer   . Uterine cancer   . Melanoma   . Hyperlipidemia     Past Surgical History  Procedure Laterality Date  . Cholecystectomy    . Mastectomy, partial    . Tonsillectomy    . Appendectomy    . Right knee replacement    . Operative hysteroscopy    . Back surgery    . Cervical spine surgery    . Abdominal hysterectomy      partial  . Breast surgery Bilateral     benign fatty tumor removed    . Carpal tunnel release Right     Current Outpatient Prescriptions  Medication Sig Dispense Refill  . ALPRAZolam (XANAX) 0.5 MG tablet Take 0.5 mg by mouth every 12 (twelve) hours as needed. For anxiety      . baclofen (LIORESAL) 10 MG tablet Take 1 tablet by mouth 3 (three) times daily.      . Biotin 1000 MCG tablet Take 1,000 mcg by mouth 2 (two) times daily as needed.        . Calcium Carbonate-Vitamin D (CALCIUM 600 + D PO) Take 1 tablet by mouth daily.      . ergocalciferol (VITAMIN D2) 50000 UNITS capsule Take 50,000 Units by mouth once a week.      . Flaxseed, Linseed, (FLAXSEED OIL) 1000 MG CAPS Take 1,000 mg by mouth 4 (four) times daily.       Marland Kitchen glipiZIDE (GLUCOTROL XL) 5 MG 24 hr tablet Take 5 mg by mouth 2 (two) times daily.      Marland Kitchen HYDROmorphone (DILAUDID) 4 MG tablet Take 1 tablet by mouth every 4 (four) hours as needed.      Marland Kitchen levofloxacin (LEVAQUIN) 750 MG tablet       . lidocaine (LIDODERM) 5 % Place 1 patch onto the skin daily as needed. FOR PAIN.    Remove & Discard patch within 12 hours or as directed by MD      . lidocaine (XYLOCAINE) 5 % ointment Apply 1 application topically as needed. FOR PAIN      . MAGNESIUM OXIDE PO Take 429 mg by mouth 2 (two) times daily.      . metoprolol tartrate (LOPRESSOR) 25 MG tablet Take 25 mg by mouth 2 (two) times daily.      . ondansetron (ZOFRAN) 8 MG tablet Take by mouth every 8 (eight) hours as needed for nausea.      . pantoprazole (PROTONIX) 40 MG tablet Take 40 mg by mouth daily.      . potassium chloride (KLOR-CON) 20 MEQ packet Take 20 mEq by mouth daily.      . Pseudoephedrine HCl (SUDAFED PO) Take 1 tablet by mouth as needed.      . Tofacitinib Citrate 5 MG TABS Take 1 tablet by mouth every morning.      . traZODone (DESYREL) 50 MG tablet Take 1 tablet by mouth at bedtime.       No current facility-administered medications for this visit.    Allergies as of 03/16/2013 - Review Complete 03/16/2013  Allergen Reaction Noted   . Butalbital-asa-caff-codeine [fiorinal-codeine] Anaphylaxis and Swelling 01/19/2011  . Darvon Other (See Comments) 01/19/2011  . Ambien [zolpidem tartrate] Other (See Comments) 01/19/2011  . Ativan [lorazepam] Other (See Comments) 01/19/2011  . Fiorinal [butalbital-aspirin-caffeine]  04/24/2012  . Penicillins  01/24/2013  . Tape Other (See Comments)  01/19/2011    Vitals: BP 143/88  Pulse 93  Ht 5\' 2"  (1.575 m)  Wt 196 lb (88.905 kg)  BMI 35.84 kg/m2 Last Weight:  Wt Readings from Last 1 Encounters:  03/16/13 196 lb (88.905 kg)   Last Height:   Ht Readings from Last 1 Encounters:  03/16/13 5\' 2"  (1.575 m)     Physical exam: Exam: Gen: NAD, conversant Eyes: anicteric sclerae, moist conjunctivae HENT: Atraumatic, oropharynx clear Neck: Trachea midline; supple,  Lungs: CTA, no wheezing, rales, rhonic                          CV: RRR, no MRG Abdomen: Soft, non-tender;  Extremities: No peripheral edema  Skin: Normal temperature, no rash,  Psych: Appropriate affect, pleasant  Neuro: MS: AA&Ox3, appropriately interactive, normal affect   Speech: fluent w/o paraphasic error  Memory: good recent and remote recall  CN: PERRL, EOMI no nystagmus, no ptosis, sensation intact to LT V1-V3 bilat, face symmetric, no weakness, hearing grossly intact, palate elevates symmetrically, shoulder shrug 5/5 bilat,  tongue protrudes midline, no fasiculations noted.  Motor: normal bulk and tone Strength: 5/5  In all extremities with noted giveaway weakness in the left upper and lower extremity  Coord: rapid alternating and point-to-point (FNF, HTS) movements intact.  Reflexes: symmetrical but diminished, absent achilles bilat, bilat downgoing toes  Sens: Decreased light touch in the left upper extremity appears somewhat to be in the C6 dermatomal distribution, decreased LT, PP in LLE, in non-dermatomal distribtuion.    Assessment:  After physical and neurologic examination, review  of laboratory studies, imaging, neurophysiology testing and pre-existing records, assessment will be reviewed on the problem list.  Plan:  Treatment plan and additional workup will be reviewed under Problem List.  1)Sensory loss 2)paresthesias 3)Chronic pain  68 year old woman presenting for follow up visit for continued evaluation of paresthesias and pain, predominantly on the left side. Returns today with concerns of increased pain in her cervical region. MRI C spine reviewed showing multi level degenerative changes with foraminal narrowing. Based on clinical history and exam suspect symptoms likely represent a cervical and possible lumbar radiculopathy. Based on mainly unilateral symptoms would also consider possible CVA though less likely based on overall clinical picture. Will follow up already ordered brain MRI, refer for EMG/NCS, PT referral already pending. Scheduled to follow up with pain clinic for further optimization of pain medications. Follow up in 3 to 4 months. Pending MRI can consider addition of daily ASA 81mg  at next visit.  \   Jim Like, DO  [facility] [address] [place], [place] [address]  Phone [phone] Fax [phone]

## 2013-03-16 NOTE — Patient Instructions (Signed)
Overall you are doing fairly well but I do want to suggest a few things today:   Continue to work with pain management to optimize your pain medication regimen  Please schedule an EMG/NCS upon checking out.  Follow up with PT for treatment of your cervical neck pain  I would like to see you back in 3 months, sooner if we need to. Please call us with any interim questions, concerns, problems, updates or refill requests.   My clinical assistant and will answer any of your questions and relay your messages to me and also relay most of my messages to you.   Our phone number is [phone]. We also have an after hours call service for urgent matters and there is a physician on-call for urgent questions. For any emergencies you know to call 911 or go to the nearest emergency room

## 2013-03-18 ENCOUNTER — Ambulatory Visit
Admission: RE | Admit: 2013-03-18 | Discharge: 2013-03-18 | Disposition: A | Discharge status: Home / Self Care | Payer: Medicare Other | Source: Ambulatory Visit | Attending: Neurology | Admitting: Neurology

## 2013-03-18 DIAGNOSIS — G459 Transient cerebral ischemic attack, unspecified: Secondary | ICD-10-CM

## 2013-03-19 ENCOUNTER — Encounter: Payer: Medicare Other | Admitting: Diagnostic Neuroimaging

## 2013-03-19 ENCOUNTER — Other Ambulatory Visit: Payer: Self-pay | Admitting: Neurology

## 2013-03-19 ENCOUNTER — Encounter: Payer: Medicare Other | Admitting: Radiology

## 2013-03-19 DIAGNOSIS — G459 Transient cerebral ischemic attack, unspecified: Secondary | ICD-10-CM

## 2013-03-21 ENCOUNTER — Encounter (INDEPENDENT_AMBULATORY_CARE_PROVIDER_SITE_OTHER): Payer: Self-pay

## 2013-03-21 ENCOUNTER — Ambulatory Visit (INDEPENDENT_AMBULATORY_CARE_PROVIDER_SITE_OTHER): Payer: Medicare Other

## 2013-03-21 DIAGNOSIS — G459 Transient cerebral ischemic attack, unspecified: Secondary | ICD-10-CM

## 2013-03-26 ENCOUNTER — Encounter (INDEPENDENT_AMBULATORY_CARE_PROVIDER_SITE_OTHER): Payer: Self-pay

## 2013-03-26 ENCOUNTER — Ambulatory Visit (INDEPENDENT_AMBULATORY_CARE_PROVIDER_SITE_OTHER): Payer: Medicare Other | Admitting: Diagnostic Neuroimaging

## 2013-03-26 DIAGNOSIS — Z0289 Encounter for other administrative examinations: Secondary | ICD-10-CM

## 2013-03-26 DIAGNOSIS — R209 Unspecified disturbances of skin sensation: Secondary | ICD-10-CM

## 2013-03-26 DIAGNOSIS — R202 Paresthesia of skin: Secondary | ICD-10-CM | POA: Insufficient documentation

## 2013-03-26 DIAGNOSIS — M5010 Cervical disc disorder with radiculopathy, unspecified cervical region: Secondary | ICD-10-CM

## 2013-03-26 DIAGNOSIS — M5416 Radiculopathy, lumbar region: Secondary | ICD-10-CM

## 2013-03-26 NOTE — Procedures (Signed)
   [facility]  NCS (NERVE CONDUCTION STUDY) WITH EMG (ELECTROMYOGRAPHY) REPORT   STUDY DATE: 03/26/13 PATIENT NAME: Kristina Patel DOB: 1945/10/10 MRN: [id number]  ORDERING CLINICIAN: Jim Like, DO   TECHNOLOGIST: Laretta Alstrom ELECTROMYOGRAPHER: Earlean Polka. Khiree Bukhari, MD  CLINICAL INFORMATION: 68 year old female with left arm and left leg numbness. History of cervical and lumbar spine surgeries. Has history of diabetes, kidney disease and rheumatoid arthritis.  FINDINGS: NERVE CONDUCTION STUDY: Left median motor response in left median motor response has prolonged distal latency (4.6 ms), decreased amplitude, normal conduction plus and normal F-wave latency. Left ulnar motor response has normal distal latency, amplitude, conduction velocity and F-wave latency. Bilateral peroneal motor responses have normal distal latencies, decreased amplitudes, normal conduction velocities and normal F-wave latencies. Bilateral tibial motor responses have prolonged distal latencies, decreased amplitudes, normal conduction velocities and normal F-wave latencies. Bilateral H reflex responses could not be obtained.  Left median sensory response has normal amplitude and slow conduction velocity (41 m/s). Left ulnar sensory response is normal. Bilateral peroneal sensory responses could not be obtained.  NEEDLE ELECTROMYOGRAPHY: Needle examination of selected muscles (deltoid, biceps, triceps, flexor carpi radialis, first dorsal interosseous, vastus medialis, gastrocnemius) shows no abnormal spontaneous activity at rest and normal motor unit recruitment on exertion. Left tibialis anterior has 1+ positive sharp waves at rest and decreased motor unit recruitment on exertion. Left C5-6, left C7-T1, left L4-5 and left L5-S1 paraspinal muscles are unremarkable.  IMPRESSION:  Abnormal study demonstrating: 1. Left median neuropathy at the wrist consistent with left carpal tunnel syndrome. 2.  Underlying length-dependent axonal sensorimotor polyneuropathy. 3. Absent H-reflex responses raise possibility of underlying S1 radiculopathies.   INTERPRETING PHYSICIAN:  Penni Bombard, MD Certified in Neurology, Neurophysiology and Neuroimaging  [facility] [address] [place], [place] [address] [phone]

## 2013-06-15 ENCOUNTER — Ambulatory Visit: Payer: Medicare Other | Admitting: Neurology

## 2013-06-20 ENCOUNTER — Ambulatory Visit: Payer: Medicare Other | Admitting: Neurology

## 2013-07-09 ENCOUNTER — Encounter (INDEPENDENT_AMBULATORY_CARE_PROVIDER_SITE_OTHER): Payer: Self-pay

## 2013-07-09 ENCOUNTER — Encounter: Payer: Self-pay | Admitting: Neurology

## 2013-07-09 ENCOUNTER — Ambulatory Visit (INDEPENDENT_AMBULATORY_CARE_PROVIDER_SITE_OTHER): Payer: Medicare Other | Admitting: Neurology

## 2013-07-09 VITALS — BP 147/82 | HR 81 | Ht 62.0 in | Wt 196.0 lb

## 2013-07-09 DIAGNOSIS — R209 Unspecified disturbances of skin sensation: Secondary | ICD-10-CM

## 2013-07-09 DIAGNOSIS — M5416 Radiculopathy, lumbar region: Secondary | ICD-10-CM

## 2013-07-09 DIAGNOSIS — M5412 Radiculopathy, cervical region: Secondary | ICD-10-CM

## 2013-07-09 DIAGNOSIS — R202 Paresthesia of skin: Secondary | ICD-10-CM

## 2013-07-09 DIAGNOSIS — M5010 Cervical disc disorder with radiculopathy, unspecified cervical region: Secondary | ICD-10-CM

## 2013-07-09 DIAGNOSIS — IMO0002 Reserved for concepts with insufficient information to code with codable children: Secondary | ICD-10-CM

## 2013-07-09 NOTE — Progress Notes (Signed)
[facility]    Provider:  Dr Janann Patel Referring Provider: Harvie Junior, MD Primary Care Physician:  Kristina Junior, MD  CC:  Pain and sensory changes  Started cymbalta recently by pain docotr, has had injections in cervical region in January and lumbar region in April, no benefit noted. Kristina Patel suggests holding off on further injections, he does not feel she is a surgical candidate.   HPI:  Kristina Patel is a 68 y.o. female here as a follow up from Kristina Patel for cervical and lumbar back pain. 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 She has been working with Kristina Patel and a pain doctor to treat her chronic cervical and lumbar pain. She reports having had a steroid injection in her cervical region in January and lumbar region in April. Minimal benefit noted. They recently started her on Cymbalta and Baclofen. She reports that Kristina Patel stated she is not a surgical candidate at this time.   MRI cervical spine from 01/2013 reviewed and shows multilevel degenerative changes with foraminal narrowing at C4-5, C5-6 and C6-7, no signs of cord compression  Prior visit 01/2013: Patient has chronic history of multiple pain concerns but has had new symptoms concerning for possible TIA. In the past few weeks has had multiple episodes of sensory loss in the left lip and periorbital region, people notice her speech is different. These episodes can last 5 to 10 minutes. She is unsure if that side of her face is drooping. During these episodes get weak in her arm and will lose sensation. Sensory changes start in the shoulder and radiate down, involves all fingers but predominantly seem to involve the 3rd thru 5th digits. These episodes are typically happening 4-5 times a week. Has history of DM, HTN, HLD. Does not take a daily aspirin. No history of stroke or TIA.   Review of Systems: Out of a complete 14 system review, the patient complains of  only the following symptoms, and all other reviewed systems are negative. Positive positive for excessive thirst back pain   History   Social History  . Marital Status: Married    Spouse Name: Kristina Patel    Number of Children: 2  . Years of Education: 16   Occupational History  . Not on file.   Social History Main Topics  . Smoking status: Never Smoker   . Smokeless tobacco: Never Used  . Alcohol Use: No  . Drug Use: No  . Sexual Activity: Not on file   Other Topics Concern  . Not on file   Social History Narrative   Patient is married Kristina Patel) and lives with her husband.   Patient has two children.   Patient is retired.   Patient has a college education.   Patient is right handed.   Patient drinks 3-4 cups of coffee and tea daily.    No family history on file.  Past Medical History  Diagnosis Date  . Rheumatoid arthritis(714.0)   . Hypertension   . Osteoarthritis   . Renal disease   . Degenerative disc disease   . Ovarian cancer   . Uterine cancer   . Melanoma   . Hyperlipidemia     Past Surgical History  Procedure Laterality Date  . Cholecystectomy    . Mastectomy, partial    . Tonsillectomy    . Appendectomy    . Right knee replacement    . Operative hysteroscopy    .  Back surgery    . Cervical spine surgery    . Abdominal hysterectomy      partial  . Breast surgery Bilateral     benign fatty tumor removed  . Carpal tunnel release Right     Current Outpatient Prescriptions  Medication Sig Dispense Refill  . ALPRAZolam (XANAX) 0.5 MG tablet Take 0.5 mg by mouth every 12 (twelve) hours as needed. For anxiety      . baclofen (LIORESAL) 10 MG tablet Take 1 tablet by mouth 3 (three) times daily.      . Biotin 1000 MCG tablet Take 1,000 mcg by mouth 2 (two) times daily as needed.        . Calcium Carbonate-Vitamin D (CALCIUM 600 + D PO) Take 1 tablet by mouth daily.      . ergocalciferol (VITAMIN D2) 50000 UNITS capsule Take 50,000 Units by mouth once a  week.      . Flaxseed, Linseed, (FLAXSEED OIL) 1000 MG CAPS Take 1,000 mg by mouth 4 (four) times daily.       Marland Kitchen glipiZIDE (GLUCOTROL XL) 5 MG 24 hr tablet Take 5 mg by mouth 2 (two) times daily.      Marland Kitchen HYDROmorphone (DILAUDID) 4 MG tablet Take 1 tablet by mouth every 4 (four) hours as needed.      Marland Kitchen levofloxacin (LEVAQUIN) 750 MG tablet       . lidocaine (LIDODERM) 5 % Place 1 patch onto the skin daily as needed. FOR PAIN.    Remove & Discard patch within 12 hours or as directed by MD      . lidocaine (XYLOCAINE) 5 % ointment Apply 1 application topically as needed. FOR PAIN      . MAGNESIUM OXIDE PO Take 429 mg by mouth 2 (two) times daily.      . metoprolol tartrate (LOPRESSOR) 25 MG tablet Take 25 mg by mouth 2 (two) times daily.      . ondansetron (ZOFRAN) 8 MG tablet Take by mouth every 8 (eight) hours as needed for nausea.      . pantoprazole (PROTONIX) 40 MG tablet Take 40 mg by mouth daily.      . potassium chloride (KLOR-CON) 20 MEQ packet Take 20 mEq by mouth daily.      . Pseudoephedrine HCl (SUDAFED PO) Take 1 tablet by mouth as needed.      . Tofacitinib Citrate 5 MG TABS Take 1 tablet by mouth every morning.      . traZODone (DESYREL) 50 MG tablet Take 1 tablet by mouth at bedtime.       No current facility-administered medications for this visit.    Allergies as of 07/09/2013 - Review Complete 07/09/2013  Allergen Reaction Noted  . Butalbital-asa-caff-codeine [fiorinal-codeine] Anaphylaxis and Swelling 01/19/2011  . Darvon Other (See Comments) 01/19/2011  . Ambien [zolpidem tartrate] Other (See Comments) 01/19/2011  . Ativan [lorazepam] Other (See Comments) 01/19/2011  . Fiorinal [butalbital-aspirin-caffeine]  04/24/2012  . Penicillins  01/24/2013  . Tape Other (See Comments) 01/19/2011    Vitals: BP 147/82  Pulse 81  Ht 5\' 2"  (1.575 m)  Wt 196 lb (88.905 kg)  BMI 35.84 kg/m2 Last Weight:  Wt Readings from Last 1 Encounters:  07/09/13 196 lb (88.905 kg)   Last  Height:   Ht Readings from Last 1 Encounters:  07/09/13 5\' 2"  (1.575 m)     Physical exam: Exam: Gen: NAD, conversant Eyes: anicteric sclerae, moist conjunctivae HENT: Atraumatic, oropharynx clear Neck: Trachea midline; supple,  Lungs: CTA, no wheezing, rales, rhonic                          CV: RRR, no MRG Abdomen: Soft, non-tender;  Extremities: No peripheral edema  Skin: Normal temperature, no rash,  Psych: Appropriate affect, pleasant  Neuro: MS: AA&Ox3, appropriately interactive, normal affect   Speech: fluent w/o paraphasic error  Memory: good recent and remote recall  CN: PERRL, EOMI no nystagmus, no ptosis, sensation intact to LT V1-V3 bilat, face symmetric, no weakness, hearing grossly intact, palate elevates symmetrically, shoulder shrug 5/5 bilat,  tongue protrudes midline, no fasiculations noted.  Motor: normal bulk and tone Strength: 5/5  In all extremities with noted giveaway weakness in the left upper and lower extremity  Coord: rapid alternating and point-to-point (FNF, HTS) movements intact.  Reflexes: symmetrical but diminished, absent achilles bilat, bilat downgoing toes  Sens: Decreased light touch in the left upper extremity appears somewhat to be in the C6 dermatomal distribution, decreased LT, PP in LLE, in non-dermatomal distribtuion.    Assessment:  After physical and neurologic examination, review of laboratory studies, imaging, neurophysiology testing and pre-existing records, assessment will be reviewed on the problem list.  Plan:  Treatment plan and additional workup will be reviewed under Problem List.  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 Currently working with Kristina Patel and a pain clinic. Will benefit from PT for treatment and possible cervical  traction, will place referral. Patient to follow up with pain clinic for continued medical management of her pain. Continue to wear wrist splint for CTS, if no improvement in symptoms can consider steroid injection in the future.   Jim Like, DO  [facility] [address] [place], [place] [address]  Phone [phone] Fax [phone]

## 2013-07-20 ENCOUNTER — Other Ambulatory Visit: Payer: Self-pay | Admitting: Specialist

## 2013-07-20 DIAGNOSIS — N63 Unspecified lump in unspecified breast: Secondary | ICD-10-CM

## 2013-07-23 ENCOUNTER — Telehealth: Payer: Self-pay | Admitting: Neurology

## 2013-07-23 NOTE — Telephone Encounter (Signed)
Patient calling to check the status of her physical therapy that was suppose to be ordered

## 2013-07-24 ENCOUNTER — Other Ambulatory Visit: Payer: Self-pay | Admitting: Neurology

## 2013-07-24 DIAGNOSIS — M542 Cervicalgia: Secondary | ICD-10-CM

## 2013-07-24 NOTE — Telephone Encounter (Signed)
Checked patient's chart for physical therapy referral, did not see anything, per last office visit note you spoke with patient about physical therapy, if this is your intention for patient to have physical therapy please place the referral, thanks.

## 2013-07-31 ENCOUNTER — Ambulatory Visit
Admission: RE | Admit: 2013-07-31 | Discharge: 2013-07-31 | Disposition: A | Discharge status: Home / Self Care | Payer: Medicare Other | Source: Ambulatory Visit | Attending: Specialist | Admitting: Specialist

## 2013-07-31 DIAGNOSIS — N63 Unspecified lump in unspecified breast: Secondary | ICD-10-CM

## 2013-08-27 ENCOUNTER — Telehealth: Payer: Self-pay | Admitting: Neurology

## 2013-08-27 NOTE — Telephone Encounter (Signed)
Patient calling wanting to know why she still has not heard anything about scheduling physical therapy.

## 2013-08-28 NOTE — Telephone Encounter (Signed)
Called [facility] and they have received PT referral and will be contacting patient to schedule appt, informed patient, she verbalized understanding

## 2013-11-07 DIAGNOSIS — R310 Gross hematuria: Secondary | ICD-10-CM | POA: Insufficient documentation

## 2013-11-12 ENCOUNTER — Ambulatory Visit: Payer: Medicare Other | Admitting: Neurology

## 2013-12-04 ENCOUNTER — Emergency Department (HOSPITAL_BASED_OUTPATIENT_CLINIC_OR_DEPARTMENT_OTHER)
Admission: EM | Admit: 2013-12-04 | Discharge: 2013-12-05 | Disposition: A | Discharge status: Home / Self Care | Payer: Medicare Other | Attending: Emergency Medicine | Admitting: Emergency Medicine

## 2013-12-04 ENCOUNTER — Emergency Department (HOSPITAL_BASED_OUTPATIENT_CLINIC_OR_DEPARTMENT_OTHER): Payer: Medicare Other

## 2013-12-04 ENCOUNTER — Encounter (HOSPITAL_BASED_OUTPATIENT_CLINIC_OR_DEPARTMENT_OTHER): Payer: Self-pay | Admitting: Emergency Medicine

## 2013-12-04 DIAGNOSIS — R109 Unspecified abdominal pain: Secondary | ICD-10-CM

## 2013-12-04 DIAGNOSIS — M069 Rheumatoid arthritis, unspecified: Secondary | ICD-10-CM | POA: Insufficient documentation

## 2013-12-04 DIAGNOSIS — Z880 Allergy status to penicillin: Secondary | ICD-10-CM | POA: Insufficient documentation

## 2013-12-04 DIAGNOSIS — N289 Disorder of kidney and ureter, unspecified: Secondary | ICD-10-CM | POA: Diagnosis not present

## 2013-12-04 DIAGNOSIS — M549 Dorsalgia, unspecified: Secondary | ICD-10-CM | POA: Insufficient documentation

## 2013-12-04 DIAGNOSIS — Z79899 Other long term (current) drug therapy: Secondary | ICD-10-CM | POA: Insufficient documentation

## 2013-12-04 DIAGNOSIS — I10 Essential (primary) hypertension: Secondary | ICD-10-CM | POA: Insufficient documentation

## 2013-12-04 DIAGNOSIS — J029 Acute pharyngitis, unspecified: Secondary | ICD-10-CM

## 2013-12-04 DIAGNOSIS — R1012 Left upper quadrant pain: Secondary | ICD-10-CM | POA: Diagnosis not present

## 2013-12-04 DIAGNOSIS — Z8542 Personal history of malignant neoplasm of other parts of uterus: Secondary | ICD-10-CM | POA: Diagnosis not present

## 2013-12-04 DIAGNOSIS — G8929 Other chronic pain: Secondary | ICD-10-CM | POA: Diagnosis not present

## 2013-12-04 DIAGNOSIS — R110 Nausea: Secondary | ICD-10-CM | POA: Insufficient documentation

## 2013-12-04 DIAGNOSIS — Z85828 Personal history of other malignant neoplasm of skin: Secondary | ICD-10-CM | POA: Diagnosis not present

## 2013-12-04 DIAGNOSIS — Z8543 Personal history of malignant neoplasm of ovary: Secondary | ICD-10-CM | POA: Insufficient documentation

## 2013-12-04 LAB — URINALYSIS, ROUTINE W REFLEX MICROSCOPIC
BILIRUBIN URINE: NEGATIVE
Glucose, UA: NEGATIVE mg/dL
Hgb urine dipstick: NEGATIVE
Ketones, ur: NEGATIVE mg/dL
Nitrite: NEGATIVE
Protein, ur: NEGATIVE mg/dL
Specific Gravity, Urine: 1.011 (ref 1.005–1.030)
UROBILINOGEN UA: 0.2 mg/dL (ref 0.0–1.0)
pH: 8 (ref 5.0–8.0)

## 2013-12-04 LAB — CBC WITH DIFFERENTIAL/PLATELET
BASOS PCT: 1 % (ref 0–1)
Basophils Absolute: 0 10*3/uL (ref 0.0–0.1)
Eosinophils Absolute: 0.1 10*3/uL (ref 0.0–0.7)
Eosinophils Relative: 1 % (ref 0–5)
HEMATOCRIT: 40.5 % (ref 36.0–46.0)
HEMOGLOBIN: 13.9 g/dL (ref 12.0–15.0)
LYMPHS ABS: 2.9 10*3/uL (ref 0.7–4.0)
Lymphocytes Relative: 35 % (ref 12–46)
MCH: 29.4 pg (ref 26.0–34.0)
MCHC: 34.3 g/dL (ref 30.0–36.0)
MCV: 85.8 fL (ref 78.0–100.0)
MONO ABS: 0.8 10*3/uL (ref 0.1–1.0)
MONOS PCT: 9 % (ref 3–12)
NEUTROS ABS: 4.6 10*3/uL (ref 1.7–7.7)
Neutrophils Relative %: 54 % (ref 43–77)
Platelets: 293 10*3/uL (ref 150–400)
RBC: 4.72 MIL/uL (ref 3.87–5.11)
RDW: 14 % (ref 11.5–15.5)
WBC: 8.4 10*3/uL (ref 4.0–10.5)

## 2013-12-04 LAB — COMPREHENSIVE METABOLIC PANEL
ALT: 30 U/L (ref 0–35)
ANION GAP: 15 (ref 5–15)
AST: 29 U/L (ref 0–37)
Albumin: 4.2 g/dL (ref 3.5–5.2)
Alkaline Phosphatase: 93 U/L (ref 39–117)
BUN: 13 mg/dL (ref 6–23)
CALCIUM: 11.5 mg/dL — AB (ref 8.4–10.5)
CO2: 26 meq/L (ref 19–32)
CREATININE: 1.2 mg/dL — AB (ref 0.50–1.10)
Chloride: 98 mEq/L (ref 96–112)
GFR, EST AFRICAN AMERICAN: 53 mL/min — AB (ref >90)
GFR, EST NON AFRICAN AMERICAN: 46 mL/min — AB (ref >90)
GLUCOSE: 104 mg/dL — AB (ref 70–99)
Potassium: 4.8 mEq/L (ref 3.7–5.3)
SODIUM: 139 meq/L (ref 137–147)
TOTAL PROTEIN: 8.1 g/dL (ref 6.0–8.3)
Total Bilirubin: 0.3 mg/dL (ref 0.3–1.2)

## 2013-12-04 LAB — URINE MICROSCOPIC-ADD ON

## 2013-12-04 LAB — LIPASE, BLOOD: Lipase: 27 U/L (ref 11–59)

## 2013-12-04 LAB — RAPID STREP SCREEN (MED CTR MEBANE ONLY): Streptococcus, Group A Screen (Direct): NEGATIVE

## 2013-12-04 MED ORDER — PROMETHAZINE HCL 25 MG PO TABS: 25.0000 mg | ORAL_TABLET | Freq: Four times a day (QID) | ORAL | Status: DC | PRN

## 2013-12-04 MED ORDER — ONDANSETRON HCL 4 MG/2ML IJ SOLN
4.0000 mg | Freq: Once | INTRAMUSCULAR | Status: AC
  Administered 2013-12-04: 4 mg via INTRAVENOUS
  Filled 2013-12-04: qty 2

## 2013-12-04 MED ORDER — ONDANSETRON 4 MG PO TBDP: 4.0000 mg | ORAL_TABLET | Freq: Three times a day (TID) | ORAL | Status: DC | PRN

## 2013-12-04 MED ORDER — SODIUM CHLORIDE 0.9 % IV BOLUS (SEPSIS)
500.0000 mL | Freq: Once | INTRAVENOUS | Status: AC
  Administered 2013-12-04: 500 mL via INTRAVENOUS

## 2013-12-04 MED ORDER — SODIUM CHLORIDE 0.9 % IV SOLN
INTRAVENOUS | Status: DC
  Administered 2013-12-04: 1000 mL via INTRAVENOUS

## 2013-12-04 MED ORDER — IOHEXOL 300 MG/ML  SOLN
50.0000 mL | Freq: Once | INTRAMUSCULAR | Status: AC | PRN
  Administered 2013-12-04: 50 mL via ORAL

## 2013-12-04 MED ORDER — IOHEXOL 300 MG/ML  SOLN
100.0000 mL | Freq: Once | INTRAMUSCULAR | Status: AC | PRN
  Administered 2013-12-04: 100 mL via INTRAVENOUS

## 2013-12-04 MED ORDER — HYDROMORPHONE HCL 1 MG/ML IJ SOLN
1.0000 mg | Freq: Once | INTRAMUSCULAR | Status: AC
  Administered 2013-12-04: 1 mg via INTRAVENOUS
  Filled 2013-12-04: qty 1

## 2013-12-04 MED ORDER — PROMETHAZINE HCL 25 MG/ML IJ SOLN
12.5000 mg | Freq: Once | INTRAMUSCULAR | Status: AC
  Administered 2013-12-04: 12.5 mg via INTRAVENOUS
  Filled 2013-12-04: qty 1

## 2013-12-04 NOTE — ED Notes (Addendum)
Patient transported to CT 

## 2013-12-04 NOTE — ED Provider Notes (Signed)
CSN: [id number]     Arrival date & time 12/04/13  1912 History  This chart was scribed for Fredia Sorrow, MD by Delphia Grates, ED Scribe. This patient was seen in room MH11/MH11 and the patient's care was started at 8:30 PM.    Chief Complaint  Patient presents with  . Sore Throat    Patient is a 68 y.o. female presenting with pharyngitis. The history is provided by the patient. No language interpreter was used.  Sore Throat This is a new problem. The current episode started more than 1 week ago. The problem has not changed since onset.Associated symptoms include chest pain, abdominal pain and headaches. Pertinent negatives include no shortness of breath.    HPI Comments: Kristina Patel is a 68 y.o. female who presents to the Emergency Department complaining of constant sore throat for the past 2 weeks. Patient rates her sore throat at a 4-5/10, and suspects it is related to the methotrexate for her rheumatoid arthritis.There is associated intermittent frontal HA and nausea that has gotten worse in the last 5 days.  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 Patient reports she had plant based hormonal implants placed in her right hip 3 weeks ago and suspects this could be the cause of her nausea. She also eports history of HA, however, she states her current HAs are located in a different area. Patient takes Tylenol with relief of her sore throat. She denies any cold symptoms, vomiting, or diarrhea.   PCP Dr. Jimmye Norman in [place] and also receives primary care through a [place] physician.  Past Medical History  Diagnosis Date  . Rheumatoid arthritis(714.0)   . Hypertension   . Osteoarthritis   . Renal disease   . Degenerative disc disease   . Ovarian cancer   . Uterine cancer   . Melanoma   . Hyperlipidemia    Past Surgical History  Procedure Laterality Date  . Cholecystectomy    . Mastectomy, partial    . Tonsillectomy     . Appendectomy    . Right knee replacement    . Operative hysteroscopy    . Back surgery    . Cervical spine surgery    . Abdominal hysterectomy      partial  . Breast surgery Bilateral     benign fatty tumor removed  . Carpal tunnel release Right    No family history on file. History  Substance Use Topics  . Smoking status: Never Smoker   . Smokeless tobacco: Never Used  . Alcohol Use: No   OB History   Grav Para Term Preterm Abortions TAB SAB Ect Mult Living                 Review of Systems  Constitutional: Negative for fever and chills.  HENT: Positive for sore throat. Negative for rhinorrhea.   Eyes: Negative for visual disturbance.  Respiratory: Negative for cough and shortness of breath.   Cardiovascular: Positive for chest pain. Negative for leg swelling.  Gastrointestinal: Positive for nausea and abdominal pain. Negative for vomiting and diarrhea.  Genitourinary: Negative for dysuria.  Musculoskeletal: Positive for back pain (chronic). Negative for neck pain.  Skin: Negative for rash.  Neurological: Positive for headaches. Negative for dizziness and light-headedness.  Hematological: Does not bruise/bleed easily.  Psychiatric/Behavioral: Negative for confusion.      Allergies  Butalbital-asa-caff-codeine; Darvon; Ambien; Ativan; Ciprofloxacin hcl; Fiorinal; Penicillins; and Tape  Home Medications  Prior to Admission medications   Medication Sig Start Date End Date Taking? Authorizing Provider  ALPRAZolam Duanne Moron) 0.5 MG tablet Take 0.5 mg by mouth every 12 (twelve) hours as needed. For anxiety    Historical Provider, MD  baclofen (LIORESAL) 10 MG tablet Take 1 tablet by mouth 3 (three) times daily. 02/24/12   Historical Provider, MD  Biotin 1000 MCG tablet Take 1,000 mcg by mouth 2 (two) times daily as needed.      Historical Provider, MD  Calcium Carbonate-Vitamin D (CALCIUM 600 + D PO) Take 1 tablet by mouth daily.    Historical Provider, MD   ergocalciferol (VITAMIN D2) 50000 UNITS capsule Take 50,000 Units by mouth once a week.    Historical Provider, MD  Flaxseed, Linseed, (FLAXSEED OIL) 1000 MG CAPS Take 1,000 mg by mouth 4 (four) times daily.     Historical Provider, MD  glipiZIDE (GLUCOTROL XL) 5 MG 24 hr tablet Take 5 mg by mouth 2 (two) times daily.    Historical Provider, MD  HYDROmorphone (DILAUDID) 4 MG tablet Take 1 tablet by mouth every 4 (four) hours as needed. 03/29/12   Historical Provider, MD  levofloxacin (LEVAQUIN) 750 MG tablet  02/23/13   Historical Provider, MD  lidocaine (LIDODERM) 5 % Place 1 patch onto the skin daily as needed. FOR PAIN.    Remove & Discard patch within 12 hours or as directed by MD    Historical Provider, MD  lidocaine (XYLOCAINE) 5 % ointment Apply 1 application topically as needed. FOR PAIN    Historical Provider, MD  MAGNESIUM OXIDE PO Take 429 mg by mouth 2 (two) times daily.    Historical Provider, MD  metoprolol tartrate (LOPRESSOR) 25 MG tablet Take 25 mg by mouth 2 (two) times daily.    Historical Provider, MD  ondansetron (ZOFRAN ODT) 4 MG disintegrating tablet Take 1 tablet (4 mg total) by mouth every 8 (eight) hours as needed for nausea or vomiting. 12/04/13   Fredia Sorrow, MD  ondansetron (ZOFRAN) 8 MG tablet Take by mouth every 8 (eight) hours as needed for nausea.    Historical Provider, MD  pantoprazole (PROTONIX) 40 MG tablet Take 40 mg by mouth daily.    Historical Provider, MD  potassium chloride (KLOR-CON) 20 MEQ packet Take 20 mEq by mouth daily.    Historical Provider, MD  promethazine (PHENERGAN) 25 MG tablet Take 1 tablet (25 mg total) by mouth every 6 (six) hours as needed for nausea or vomiting. 12/04/13   Fredia Sorrow, MD  Pseudoephedrine HCl (SUDAFED PO) Take 1 tablet by mouth as needed.    Historical Provider, MD  Tofacitinib Citrate 5 MG TABS Take 1 tablet by mouth every morning.    Historical Provider, MD  traZODone (DESYREL) 50 MG tablet Take 1 tablet by mouth at  bedtime. 12/15/12   Historical Provider, MD   Triage Vitals: BP 142/91  Pulse 98  Temp(Src) 98.5 F (36.9 C) (Oral)  Resp 20  Ht 5\' 3"  (1.6 m)  Wt 185 lb (83.915 kg)  BMI 32.78 kg/m2  SpO2 98%  Physical Exam  Nursing note and vitals reviewed. Constitutional: She is oriented to person, place, and time. She appears well-developed and well-nourished. No distress.  HENT:  Head: Normocephalic and atraumatic.  Mouth/Throat: Oropharynx is clear and moist. No oropharyngeal exudate or posterior oropharyngeal erythema.  Eyes: Conjunctivae and EOM are normal. No scleral icterus.  Neck: Neck supple. No tracheal deviation present.  Cardiovascular: Normal rate, regular rhythm and normal heart  sounds.   Pulmonary/Chest: Effort normal and breath sounds normal. No respiratory distress.  Abdominal: Bowel sounds are normal. There is tenderness in the left upper quadrant.  Musculoskeletal: Normal range of motion. She exhibits no edema.  Lymphadenopathy:    She has no cervical adenopathy.  Neurological: She is alert and oriented to person, place, and time. No cranial nerve deficit. She exhibits normal muscle tone. Coordination normal.  Skin: Skin is warm and dry.  Psychiatric: She has a normal mood and affect. Her behavior is normal.    ED Course  Procedures (including critical care time)  DIAGNOSTIC STUDIES: Oxygen Saturation is 98% on room air, normal by my interpretation.    COORDINATION OF CARE: At 2038 Discussed treatment plan with patient which includes Zofran, labs, and CT of abdomen. Patient agrees.   Medications  0.9 %  sodium chloride infusion (1,000 mLs Intravenous New Bag/Given 12/04/13 2233)  promethazine (PHENERGAN) injection 12.5 mg (not administered)  sodium chloride 0.9 % bolus 500 mL (0 mLs Intravenous Stopped 12/04/13 2232)  ondansetron (ZOFRAN) injection 4 mg (4 mg Intravenous Given 12/04/13 2112)  HYDROmorphone (DILAUDID) injection 1 mg (1 mg Intravenous Given 12/04/13  2111)  iohexol (OMNIPAQUE) 300 MG/ML solution 50 mL (50 mLs Oral Contrast Given 12/04/13 2158)  iohexol (OMNIPAQUE) 300 MG/ML solution 100 mL (100 mLs Intravenous Contrast Given 12/04/13 2159)  HYDROmorphone (DILAUDID) injection 1 mg (1 mg Intravenous Given 12/04/13 2230)  ondansetron (ZOFRAN) injection 4 mg (4 mg Intravenous Given 12/04/13 2229)   Results for orders placed during the hospital encounter of 12/04/13  RAPID STREP SCREEN      Result Value Ref Range   Streptococcus, Group A Screen (Direct) NEGATIVE  NEGATIVE  COMPREHENSIVE METABOLIC PANEL      Result Value Ref Range   Sodium 139  137 - 147 mEq/L   Potassium 4.8  3.7 - 5.3 mEq/L   Chloride 98  96 - 112 mEq/L   CO2 26  19 - 32 mEq/L   Glucose, Bld 104 (*) 70 - 99 mg/dL   BUN 13  6 - 23 mg/dL   Creatinine, Ser 1.20 (*) 0.50 - 1.10 mg/dL   Calcium 11.5 (*) 8.4 - 10.5 mg/dL   Total Protein 8.1  6.0 - 8.3 g/dL   Albumin 4.2  3.5 - 5.2 g/dL   AST 29  0 - 37 U/L   ALT 30  0 - 35 U/L   Alkaline Phosphatase 93  39 - 117 U/L   Total Bilirubin 0.3  0.3 - 1.2 mg/dL   GFR calc non Af Amer 46 (*) >90 mL/min   GFR calc Af Amer 53 (*) >90 mL/min   Anion gap 15  5 - 15  LIPASE, BLOOD      Result Value Ref Range   Lipase 27  11 - 59 U/L  CBC WITH DIFFERENTIAL      Result Value Ref Range   WBC 8.4  4.0 - 10.5 K/uL   RBC 4.72  3.87 - 5.11 MIL/uL   Hemoglobin 13.9  12.0 - 15.0 g/dL   HCT 40.5  36.0 - 46.0 %   MCV 85.8  78.0 - 100.0 fL   MCH 29.4  26.0 - 34.0 pg   MCHC 34.3  30.0 - 36.0 g/dL   RDW 14.0  11.5 - 15.5 %   Platelets 293  150 - 400 K/uL   Neutrophils Relative % 54  43 - 77 %   Neutro Abs 4.6  1.7 - 7.7  K/uL   Lymphocytes Relative 35  12 - 46 %   Lymphs Abs 2.9  0.7 - 4.0 K/uL   Monocytes Relative 9  3 - 12 %   Monocytes Absolute 0.8  0.1 - 1.0 K/uL   Eosinophils Relative 1  0 - 5 %   Eosinophils Absolute 0.1  0.0 - 0.7 K/uL   Basophils Relative 1  0 - 1 %   Basophils Absolute 0.0  0.0 - 0.1 K/uL  URINALYSIS,  ROUTINE W REFLEX MICROSCOPIC      Result Value Ref Range   Color, Urine YELLOW  YELLOW   APPearance CLEAR  CLEAR   Specific Gravity, Urine 1.011  1.005 - 1.030   pH 8.0  5.0 - 8.0   Glucose, UA NEGATIVE  NEGATIVE mg/dL   Hgb urine dipstick NEGATIVE  NEGATIVE   Bilirubin Urine NEGATIVE  NEGATIVE   Ketones, ur NEGATIVE  NEGATIVE mg/dL   Protein, ur NEGATIVE  NEGATIVE mg/dL   Urobilinogen, UA 0.2  0.0 - 1.0 mg/dL   Nitrite NEGATIVE  NEGATIVE   Leukocytes, UA TRACE (*) NEGATIVE  URINE MICROSCOPIC-ADD ON      Result Value Ref Range   Squamous Epithelial / LPF RARE  RARE   WBC, UA 0-2  <3 WBC/hpf   Bacteria, UA RARE  RARE   Imaging Review Ct Head Wo Contrast  12/04/2013   CLINICAL DATA:  headache headache.  Nausea.  EXAM: CT HEAD WITHOUT CONTRAST  TECHNIQUE: Contiguous axial images were obtained from the base of the skull through the vertex without intravenous contrast.  COMPARISON:  None.  FINDINGS: No mass lesion, mass effect, midline shift, hydrocephalus, hemorrhage. No territorial ischemia or acute infarction. Paranasal sinuses are within normal limits.  IMPRESSION: Negative CT head.   Electronically Signed   By: Dereck Ligas M.D.   On: 12/04/2013 22:04   Ct Abdomen Pelvis W Contrast  12/04/2013   CLINICAL DATA:  Abdominal pain.  Nausea.  Headache.  Sore throat.  EXAM: CT ABDOMEN AND PELVIS WITH CONTRAST  TECHNIQUE: Multidetector CT imaging of the abdomen and pelvis was performed using the standard protocol following bolus administration of intravenous contrast.  CONTRAST:  7mL OMNIPAQUE IOHEXOL 300 MG/ML SOLN, 172mL OMNIPAQUE IOHEXOL 300 MG/ML SOLN  COMPARISON:  04/11/2013.  FINDINGS: Musculoskeletal: T12 vertebral augmentation. L2-L3 Ray cage fusion. Severe L5-S1 degenerative disc disease. No destructive osseous lesions.  Lung Bases: Dependent atelectasis and lingular scarring.  Liver:  Normal.  Spleen:  Normal.  Gallbladder:  Cholecystectomy.  Common bile duct:  Normal.  Pancreas:   Normal.  Adrenal glands:  Normal bilaterally.  Kidneys: Bilateral renal scarring. Normal enhancement. Delayed excretion of contrast is within normal limits.  Stomach:  Normal.  Small bowel:  Normal.  Colon:   Moderate stool burden.  No inflammatory changes of colon.  Pelvic Genitourinary: Laxity of the pelvic floor again noted. Hysterectomy. Urinary bladder collapsed.  Vasculature: Atherosclerosis.  No acute vascular abnormality.  Body Wall: Fat containing periumbilical hernia.  IMPRESSION: 1. No acute abnormality. 2. Appendectomy, cholecystectomy and hysterectomy.   Electronically Signed   By: Dereck Ligas M.D.   On: 12/04/2013 22:22      Imaging Review Ct Head Wo Contrast  12/04/2013   CLINICAL DATA:  headache headache.  Nausea.  EXAM: CT HEAD WITHOUT CONTRAST  TECHNIQUE: Contiguous axial images were obtained from the base of the skull through the vertex without intravenous contrast.  COMPARISON:  None.  FINDINGS: No mass lesion, mass effect, midline shift,  hydrocephalus, hemorrhage. No territorial ischemia or acute infarction. Paranasal sinuses are within normal limits.  IMPRESSION: Negative CT head.   Electronically Signed   By: Dereck Ligas M.D.   On: 12/04/2013 22:04   Ct Abdomen Pelvis W Contrast  12/04/2013   CLINICAL DATA:  Abdominal pain.  Nausea.  Headache.  Sore throat.  EXAM: CT ABDOMEN AND PELVIS WITH CONTRAST  TECHNIQUE: Multidetector CT imaging of the abdomen and pelvis was performed using the standard protocol following bolus administration of intravenous contrast.  CONTRAST:  20mL OMNIPAQUE IOHEXOL 300 MG/ML SOLN, 154mL OMNIPAQUE IOHEXOL 300 MG/ML SOLN  COMPARISON:  04/11/2013.  FINDINGS: Musculoskeletal: T12 vertebral augmentation. L2-L3 Ray cage fusion. Severe L5-S1 degenerative disc disease. No destructive osseous lesions.  Lung Bases: Dependent atelectasis and lingular scarring.  Liver:  Normal.  Spleen:  Normal.  Gallbladder:  Cholecystectomy.  Common bile duct:  Normal.   Pancreas:  Normal.  Adrenal glands:  Normal bilaterally.  Kidneys: Bilateral renal scarring. Normal enhancement. Delayed excretion of contrast is within normal limits.  Stomach:  Normal.  Small bowel:  Normal.  Colon:   Moderate stool burden.  No inflammatory changes of colon.  Pelvic Genitourinary: Laxity of the pelvic floor again noted. Hysterectomy. Urinary bladder collapsed.  Vasculature: Atherosclerosis.  No acute vascular abnormality.  Body Wall: Fat containing periumbilical hernia.  IMPRESSION: 1. No acute abnormality. 2. Appendectomy, cholecystectomy and hysterectomy.   Electronically Signed   By: Dereck Ligas M.D.   On: 12/04/2013 22:22     EKG Interpretation None      MDM   Final diagnoses:  Nausea  Pharyngitis  Abdominal pain, unspecified abdominal location  Renal insufficiency  Hypercalcemia    Extensive workup for patient's symptoms without any significant findings. Rapid strep for strep pharyngitis. CT scan negative for explanation for the left upper corner abdominal pain. CT head also negative that was done for the headache. Patient's labs CBC completely normal. Electrolytes mild hypercalcemia. And some mild renal insufficiency. Patient is on methotrexate and will need to followup closely with her doctor. It is also possible that the injection she had 4 weight control could be playing a role. Patient will be continued on anti-emetics. Patient has pain medication at home.  I personally performed the services described in this documentation, which was scribed in my presence. The recorded information has been reviewed and is accurate.     Fredia Sorrow, MD 12/04/13 [phone]

## 2013-12-04 NOTE — Discharge Instructions (Signed)
Take the Zofran and Phenergan as needed for the nausea. Rapid strep was negative but throat culture is pending. Labwork up as we discussed. Some mild hypercalcemia. And some mild renal insufficiency. Followup with your record Dr. to have both of these check to make sure that they return to normal. In addition recommend fine with her regular Dr. for recheck since you are on methotrexate. Return for any newer worse symptoms.

## 2013-12-04 NOTE — ED Notes (Signed)
Will wait 20 minutes to ensure phenergan is effective before discharging patient.

## 2013-12-04 NOTE — ED Notes (Signed)
Sore throat and nausea x 2 weeks.

## 2013-12-04 NOTE — ED Notes (Signed)
Pt reports she has "plant based hormonal implants" placed in her right hip at a [facility] center 3 weeks ago- states nausea started after they were placed

## 2013-12-06 LAB — CULTURE, GROUP A STREP

## 2013-12-21 ENCOUNTER — Telehealth: Payer: Self-pay | Admitting: Neurology

## 2013-12-21 NOTE — Telephone Encounter (Signed)
Spoke with spouse, left message requesting a return call to reschedule 4 mo fu visit with Dr. Erlinda Hong or Dr. Leonie Man (TIA) former [facility] patient.

## 2014-01-29 ENCOUNTER — Encounter: Payer: Self-pay | Admitting: Family Medicine

## 2014-07-21 ENCOUNTER — Encounter (HOSPITAL_BASED_OUTPATIENT_CLINIC_OR_DEPARTMENT_OTHER): Payer: Self-pay | Admitting: *Deleted

## 2014-07-21 ENCOUNTER — Emergency Department (HOSPITAL_BASED_OUTPATIENT_CLINIC_OR_DEPARTMENT_OTHER): Payer: Medicare Other

## 2014-07-21 ENCOUNTER — Emergency Department (HOSPITAL_BASED_OUTPATIENT_CLINIC_OR_DEPARTMENT_OTHER)
Admission: EM | Admit: 2014-07-21 | Discharge: 2014-07-21 | Disposition: A | Discharge status: Home / Self Care | Payer: Medicare Other | Attending: Emergency Medicine | Admitting: Emergency Medicine

## 2014-07-21 DIAGNOSIS — G43109 Migraine with aura, not intractable, without status migrainosus: Secondary | ICD-10-CM | POA: Diagnosis not present

## 2014-07-21 DIAGNOSIS — Z8542 Personal history of malignant neoplasm of other parts of uterus: Secondary | ICD-10-CM | POA: Diagnosis not present

## 2014-07-21 DIAGNOSIS — Z8742 Personal history of other diseases of the female genital tract: Secondary | ICD-10-CM | POA: Insufficient documentation

## 2014-07-21 DIAGNOSIS — I10 Essential (primary) hypertension: Secondary | ICD-10-CM | POA: Diagnosis not present

## 2014-07-21 DIAGNOSIS — M069 Rheumatoid arthritis, unspecified: Secondary | ICD-10-CM | POA: Diagnosis not present

## 2014-07-21 DIAGNOSIS — E785 Hyperlipidemia, unspecified: Secondary | ICD-10-CM | POA: Diagnosis not present

## 2014-07-21 DIAGNOSIS — Z8543 Personal history of malignant neoplasm of ovary: Secondary | ICD-10-CM | POA: Diagnosis not present

## 2014-07-21 DIAGNOSIS — Z85820 Personal history of malignant melanoma of skin: Secondary | ICD-10-CM | POA: Insufficient documentation

## 2014-07-21 DIAGNOSIS — Z79899 Other long term (current) drug therapy: Secondary | ICD-10-CM | POA: Insufficient documentation

## 2014-07-21 DIAGNOSIS — Z880 Allergy status to penicillin: Secondary | ICD-10-CM | POA: Diagnosis not present

## 2014-07-21 DIAGNOSIS — R51 Headache: Secondary | ICD-10-CM | POA: Diagnosis present

## 2014-07-21 LAB — CBC WITH DIFFERENTIAL/PLATELET
BASOS ABS: 0.1 10*3/uL (ref 0.0–0.1)
Basophils Relative: 1 % (ref 0–1)
Eosinophils Absolute: 0.2 10*3/uL (ref 0.0–0.7)
Eosinophils Relative: 3 % (ref 0–5)
HCT: 36.4 % (ref 36.0–46.0)
Hemoglobin: 12.4 g/dL (ref 12.0–15.0)
Lymphocytes Relative: 54 % — ABNORMAL HIGH (ref 12–46)
Lymphs Abs: 4.1 10*3/uL — ABNORMAL HIGH (ref 0.7–4.0)
MCH: 27.8 pg (ref 26.0–34.0)
MCHC: 34.1 g/dL (ref 30.0–36.0)
MCV: 81.6 fL (ref 78.0–100.0)
MONOS PCT: 8 % (ref 3–12)
Monocytes Absolute: 0.6 10*3/uL (ref 0.1–1.0)
Neutro Abs: 2.8 10*3/uL (ref 1.7–7.7)
Neutrophils Relative %: 36 % — ABNORMAL LOW (ref 43–77)
Platelets: 252 10*3/uL (ref 150–400)
RBC: 4.46 MIL/uL (ref 3.87–5.11)
RDW: 13.2 % (ref 11.5–15.5)
WBC: 7.7 10*3/uL (ref 4.0–10.5)

## 2014-07-21 LAB — BASIC METABOLIC PANEL
Anion gap: 8 (ref 5–15)
BUN: 16 mg/dL (ref 6–20)
CHLORIDE: 105 mmol/L (ref 101–111)
CO2: 26 mmol/L (ref 22–32)
Calcium: 9.1 mg/dL (ref 8.9–10.3)
Creatinine, Ser: 1.22 mg/dL — ABNORMAL HIGH (ref 0.44–1.00)
GFR calc non Af Amer: 45 mL/min — ABNORMAL LOW (ref >60)
GFR, EST AFRICAN AMERICAN: 52 mL/min — AB (ref >60)
Glucose, Bld: 145 mg/dL — ABNORMAL HIGH (ref 65–99)
Potassium: 4 mmol/L (ref 3.5–5.1)
SODIUM: 139 mmol/L (ref 135–145)

## 2014-07-21 MED ORDER — METOCLOPRAMIDE HCL 5 MG/ML IJ SOLN
10.0000 mg | Freq: Once | INTRAMUSCULAR | Status: AC
  Administered 2014-07-21: 10 mg via INTRAVENOUS
  Filled 2014-07-21: qty 2

## 2014-07-21 MED ORDER — DEXAMETHASONE SODIUM PHOSPHATE 10 MG/ML IJ SOLN
10.0000 mg | Freq: Once | INTRAMUSCULAR | Status: AC
  Administered 2014-07-21: 10 mg via INTRAVENOUS
  Filled 2014-07-21: qty 1

## 2014-07-21 MED ORDER — SODIUM CHLORIDE 0.9 % IV BOLUS (SEPSIS)
500.0000 mL | Freq: Once | INTRAVENOUS | Status: AC
  Administered 2014-07-21: 500 mL via INTRAVENOUS

## 2014-07-21 MED ORDER — KETOROLAC TROMETHAMINE 30 MG/ML IJ SOLN
30.0000 mg | Freq: Once | INTRAMUSCULAR | Status: AC
  Administered 2014-07-21: 30 mg via INTRAVENOUS
  Filled 2014-07-21: qty 1

## 2014-07-21 MED ORDER — DIVALPROEX SODIUM 250 MG PO DR TAB
500.0000 mg | DELAYED_RELEASE_TABLET | Freq: Two times a day (BID) | ORAL | Status: DC
  Administered 2014-07-21: 500 mg via ORAL
  Filled 2014-07-21: qty 2

## 2014-07-21 MED ORDER — KETOROLAC TROMETHAMINE 30 MG/ML IJ SOLN
INTRAMUSCULAR | Status: AC
  Filled 2014-07-21: qty 1

## 2014-07-21 NOTE — ED Notes (Signed)
[facility] is [facility] pt (Dr[facility] Mick[facility] Hillier). PCP is Dr. Jimmye Norman. Here tonight with husband from home. C/o worsening HA and high BP. Also mentions nausea. Mentions L arm arthritis pain. (Denies: CP, sob, dizziness, visual changes, vomiting or other sx). Reports 201/110. Has been dealing with bloated distended abd and HA for months, but HA is worse tonight. Pt brought med sheet (placed in chart).  Alert, NAD, calm, interactive, steady gait.

## 2014-07-21 NOTE — ED Notes (Signed)
Kristina Patel denies allergies to Ibuprohen tylenol or ASA

## 2014-07-21 NOTE — Discharge Instructions (Signed)

## 2014-07-21 NOTE — ED Notes (Addendum)
Pt denies blurred vision nausea or emesis at this time ambulated unit with minimal assist gait steady

## 2014-07-21 NOTE — ED Provider Notes (Addendum)
CSN: [id number]     Arrival date & time 07/21/14  0112 History   First MD Initiated Contact with Patient 07/21/14 0146     Chief Complaint  Patient presents with  . Headache     (Consider location/radiation/quality/duration/timing/severity/associated sxs/prior Treatment) Patient is a 69 y.o. female presenting with headaches. The history is provided by the patient.  Headache Pain location:  Frontal Quality:  Dull Radiates to:  Does not radiate Onset quality:  Gradual Duration:  1 day Timing:  Constant Progression:  Worsening Chronicity:  Recurrent (has daily headaches) Similar to prior headaches: yes   Context: not activity, not exposure to bright light, not caffeine, not coughing, not defecating, not eating, not stress, not exposure to cold air, not intercourse, not loud noise and not straining   Relieved by:  Nothing Worsened by:  Nothing Ineffective treatments: dilaudid and trazedone and xanax. Associated symptoms: no back pain, no blurred vision, no dizziness, no facial pain, no fever, no focal weakness, no loss of balance, no myalgias, no neck pain, no neck stiffness, no numbness, no photophobia, no seizures, no sore throat, no swollen glands, no syncope, no tingling, no URI, no vomiting and no weakness   Risk factors: no family hx of SAH     Past Medical History  Diagnosis Date  . Rheumatoid arthritis(714.0)   . Hypertension   . Osteoarthritis   . Renal disease   . Degenerative disc disease   . Ovarian cancer   . Uterine cancer   . Melanoma   . Hyperlipidemia    Past Surgical History  Procedure Laterality Date  . Cholecystectomy    . Mastectomy, partial    . Tonsillectomy    . Appendectomy    . Right knee replacement    . Operative hysteroscopy    . Back surgery    . Cervical spine surgery    . Abdominal hysterectomy      partial  . Breast surgery Bilateral     benign fatty tumor removed  . Carpal tunnel release Right    History reviewed. No pertinent  family history. History  Substance Use Topics  . Smoking status: Never Smoker   . Smokeless tobacco: Never Used  . Alcohol Use: No   OB History    No data available     Review of Systems  Constitutional: Negative for fever and chills.  HENT: Negative for sore throat.   Eyes: Negative for blurred vision, photophobia and visual disturbance.  Respiratory: Negative for shortness of breath.   Cardiovascular: Negative for chest pain and syncope.  Gastrointestinal: Negative for vomiting.  Musculoskeletal: Negative for myalgias, back pain, neck pain and neck stiffness.  Skin: Negative for rash.  Neurological: Positive for headaches. Negative for dizziness, focal weakness, seizures, syncope, facial asymmetry, speech difficulty, weakness, light-headedness, numbness and loss of balance.  Hematological: Negative for adenopathy.  All other systems reviewed and are negative.     Allergies  Butalbital-asa-caff-codeine; Darvon; Ambien; Ativan; Ciprofloxacin hcl; Fiorinal; Penicillins; and Tape  Home Medications   Prior to Admission medications   Medication Sig Start Date End Date Taking? Authorizing Provider  ALPRAZolam Duanne Moron) 0.5 MG tablet Take 0.5 mg by mouth every 12 (twelve) hours as needed. For anxiety    Historical Provider, MD  baclofen (LIORESAL) 10 MG tablet Take 1 tablet by mouth 3 (three) times daily. 02/24/12   Historical Provider, MD  Biotin 1000 MCG tablet Take 1,000 mcg by mouth 2 (two) times daily as needed.  Historical Provider, MD  Calcium Carbonate-Vitamin D (CALCIUM 600 + D PO) Take 1 tablet by mouth daily.    Historical Provider, MD  ergocalciferol (VITAMIN D2) 50000 UNITS capsule Take 50,000 Units by mouth once a week.    Historical Provider, MD  Flaxseed, Linseed, (FLAXSEED OIL) 1000 MG CAPS Take 1,000 mg by mouth 4 (four) times daily.     Historical Provider, MD  glipiZIDE (GLUCOTROL XL) 5 MG 24 hr tablet Take 5 mg by mouth 2 (two) times daily.    Historical  Provider, MD  HYDROmorphone (DILAUDID) 4 MG tablet Take 1 tablet by mouth every 4 (four) hours as needed. 03/29/12   Historical Provider, MD  levofloxacin (LEVAQUIN) 750 MG tablet  02/23/13   Historical Provider, MD  lidocaine (LIDODERM) 5 % Place 1 patch onto the skin daily as needed. FOR PAIN.    Remove & Discard patch within 12 hours or as directed by MD    Historical Provider, MD  lidocaine (XYLOCAINE) 5 % ointment Apply 1 application topically as needed. FOR PAIN    Historical Provider, MD  MAGNESIUM OXIDE PO Take 429 mg by mouth 2 (two) times daily.    Historical Provider, MD  metoprolol tartrate (LOPRESSOR) 25 MG tablet Take 25 mg by mouth 2 (two) times daily.    Historical Provider, MD  ondansetron (ZOFRAN ODT) 4 MG disintegrating tablet Take 1 tablet (4 mg total) by mouth every 8 (eight) hours as needed for nausea or vomiting. 12/04/13   Fredia Sorrow, MD  ondansetron (ZOFRAN) 8 MG tablet Take by mouth every 8 (eight) hours as needed for nausea.    Historical Provider, MD  pantoprazole (PROTONIX) 40 MG tablet Take 40 mg by mouth daily.    Historical Provider, MD  potassium chloride (KLOR-CON) 20 MEQ packet Take 20 mEq by mouth daily.    Historical Provider, MD  promethazine (PHENERGAN) 25 MG tablet Take 1 tablet (25 mg total) by mouth every 6 (six) hours as needed for nausea or vomiting. 12/04/13   Fredia Sorrow, MD  Pseudoephedrine HCl (SUDAFED PO) Take 1 tablet by mouth as needed.    Historical Provider, MD  Tofacitinib Citrate 5 MG TABS Take 1 tablet by mouth every morning.    Historical Provider, MD  traZODone (DESYREL) 50 MG tablet Take 1 tablet by mouth at bedtime. 12/15/12   Historical Provider, MD   BP 192/94 mmHg  Pulse 80  Temp(Src) 98.6 F (37 C) (Oral)  Resp 20  Ht 5\' 3"  (1.6 m)  Wt 180 lb (81.647 kg)  BMI 31.89 kg/m2  SpO2 98% Physical Exam  Constitutional: She is oriented to person, place, and time. She appears well-developed and well-nourished. No distress.  Appears  to be very sleepy  HENT:  Head: Normocephalic and atraumatic.  Mouth/Throat: Oropharynx is clear and moist.  No temporal tenderness no bulging vessels  Eyes: Conjunctivae and EOM are normal. Pupils are equal, round, and reactive to light.  Neck: Normal range of motion. Neck supple.  Cardiovascular: Normal rate, regular rhythm and intact distal pulses.   Pulmonary/Chest: Effort normal and breath sounds normal. No respiratory distress. She has no wheezes. She has no rales.  Abdominal: Soft. Bowel sounds are normal. There is no tenderness. There is no rebound and no guarding.  Musculoskeletal: Normal range of motion. She exhibits no edema or tenderness.  Lymphadenopathy:    She has no cervical adenopathy.  Neurological: She is alert and oriented to person, place, and time. She has normal reflexes. She  displays normal reflexes. No cranial nerve deficit. She exhibits normal muscle tone. Coordination normal.  Intact cognition  Skin: Skin is warm and dry.  Psychiatric: She has a normal mood and affect.    ED Course  Procedures (including critical care time) Labs Review Labs Reviewed  CBC WITH DIFFERENTIAL/PLATELET - Abnormal; Notable for the following:    Neutrophils Relative % 36 (*)    Lymphocytes Relative 54 (*)    Lymphs Abs 4.1 (*)    All other components within normal limits  BASIC METABOLIC PANEL - Abnormal; Notable for the following:    Glucose, Bld 145 (*)    Creatinine, Ser 1.22 (*)    GFR calc non Af Amer 45 (*)    GFR calc Af Amer 52 (*)    All other components within normal limits    Imaging Review Ct Head Wo Contrast  07/21/2014   CLINICAL DATA:  Acute onset of headache and high blood pressure. Nausea. Left arm pain. Abdominal distention. Initial encounter.  EXAM: CT HEAD WITHOUT CONTRAST  TECHNIQUE: Contiguous axial images were obtained from the base of the skull through the vertex without intravenous contrast.  COMPARISON:  CT of the head performed 12/04/2013, and MRI  of the brain performed 03/18/2013  FINDINGS: There is no evidence of acute infarction, mass lesion, or intra- or extra-axial hemorrhage on CT.  Mild periventricular white matter change likely reflects small vessel ischemic microangiopathy.  The posterior fossa, including the cerebellum, brainstem and fourth ventricle, is within normal limits. The third and lateral ventricles, and basal ganglia are unremarkable in appearance. The cerebral hemispheres are symmetric in appearance, with normal gray-white differentiation. No mass effect or midline shift is seen.  There is no evidence of fracture; visualized osseous structures are unremarkable in appearance. The orbits are within normal limits. The paranasal sinuses and mastoid air cells are well-aerated. No significant soft tissue abnormalities are seen.  IMPRESSION: 1. No acute intracranial pathology seen on CT. 2. Mild small vessel ischemic microangiopathy.   Electronically Signed   By: Garald Balding M.D.   On: 07/21/2014 02:49     EKG Interpretation None      MDM   Final diagnoses:  None  confirmed with patient that she is not allergic to ibuprofen, alleve, aspirin or tylenol.  dexamethasone given and then toradol given 1 hour later.    Suspect this is acute on chronic migraine however patient started Kathrin Ruddy this week which is also known to cause headaches.  Highly doubt meningitis or encephalitis without fever changes in cognition nor neck stiffness.  Normal white count and head CT.  Patient is on a lot of medication that is also known to cause rebound headache.  As patient is already sleepy from her home medications will treat with non sedating migraine cocktail.    Pain improved post medication.  Safe for discharge at this time     Sabriel Borromeo, MD 07/21/14 4481  Veatrice Kells, MD 07/21/14 8563

## 2014-09-12 ENCOUNTER — Encounter: Payer: Self-pay | Admitting: Neurology

## 2014-09-12 ENCOUNTER — Ambulatory Visit (INDEPENDENT_AMBULATORY_CARE_PROVIDER_SITE_OTHER): Payer: Medicare Other | Admitting: Neurology

## 2014-09-12 VITALS — BP 141/84 | HR 69 | Ht 63.0 in | Wt 197.4 lb

## 2014-09-12 DIAGNOSIS — G44209 Tension-type headache, unspecified, not intractable: Secondary | ICD-10-CM | POA: Diagnosis not present

## 2014-09-12 DIAGNOSIS — T451X5A Adverse effect of antineoplastic and immunosuppressive drugs, initial encounter: Secondary | ICD-10-CM

## 2014-09-12 DIAGNOSIS — G620 Drug-induced polyneuropathy: Secondary | ICD-10-CM | POA: Diagnosis not present

## 2014-09-12 MED ORDER — TRAMADOL HCL 50 MG PO TABS: 100.0000 mg | ORAL_TABLET | Freq: Four times a day (QID) | ORAL | Status: DC | PRN

## 2014-09-12 MED ORDER — TOPIRAMATE 25 MG PO TABS
25.0000 mg | ORAL_TABLET | Freq: Two times a day (BID) | ORAL | Status: DC
Start: 2014-09-12 — End: 2015-06-03

## 2014-09-12 NOTE — Patient Instructions (Signed)
I had a long discussion with the patient regarding her headaches and chronic neck pain. I advised her to do regular neck stretching exercises and to try tramadol for symptomatic relief and Topamax for headache prophylaxis. Check MRI scan of the brain and cervical spine. Return for follow-up in 2 months or call earlier if necessary. Tension Headache A tension headache is a feeling of pain, pressure, or aching often felt over the front and sides of the head. The pain can be dull or can feel tight (constricting). It is the most common type of headache. Tension headaches are not normally associated with nausea or vomiting and do not get worse with physical activity. Tension headaches can last 30 minutes to several days.  CAUSES  The exact cause is not known, but it may be caused by chemicals and hormones in the brain that lead to pain. Tension headaches often begin after stress, anxiety, or depression. Other triggers may include:  Alcohol.  Caffeine (too much or withdrawal).  Respiratory infections (colds, flu, sinus infections).  Dental problems or teeth clenching.  Fatigue.  Holding your head and neck in one position too long while using a computer. SYMPTOMS   Pressure around the head.   Dull, aching head pain.   Pain felt over the front and sides of the head.   Tenderness in the muscles of the head, neck, and shoulders. DIAGNOSIS  A tension headache is often diagnosed based on:   Symptoms.   Physical examination.   A CT scan or MRI of your head. These tests may be ordered if symptoms are severe or unusual. TREATMENT  Medicines may be given to help relieve symptoms.  HOME CARE INSTRUCTIONS   Only take over-the-counter or prescription medicines for pain or discomfort as directed by your caregiver.   Lie down in a dark, quiet room when you have a headache.   Keep a journal to find out what may be triggering your headaches. For example, write down:  What you eat and  drink.  How much sleep you get.  Any change to your diet or medicines.  Try massage or other relaxation techniques.   Ice packs or heat applied to the head and neck can be used. Use these 3 to 4 times per day for 15 to 20 minutes each time, or as needed.   Limit stress.   Sit up straight, and do not tense your muscles.   Quit smoking if you smoke.  Limit alcohol use.  Decrease the amount of caffeine you drink, or stop drinking caffeine.  Eat and exercise regularly.  Get 7 to 9 hours of sleep, or as recommended by your caregiver.  Avoid excessive use of pain medicine as recurrent headaches can occur.  SEEK MEDICAL CARE IF:   You have problems with the medicines you were prescribed.  Your medicines do not work.  You have a change from the usual headache.  You have nausea or vomiting. SEEK IMMEDIATE MEDICAL CARE IF:   Your headache becomes severe.  You have a fever.  You have a stiff neck.  You have loss of vision.  You have muscular weakness or loss of muscle control.  You lose your balance or have trouble walking.  You feel faint or pass out.  You have severe symptoms that are different from your first symptoms. MAKE SURE YOU:   Understand these instructions.  Will watch your condition.  Will get help right away if you are not doing well or get worse. Document  Released: 02/08/2005 Document Revised: 05/03/2011 Document Reviewed: 01/29/2011 [facility] Patient Information 2015 [employer], [place]. This information is not intended to replace advice given to you by your health care provider. Make sure you discuss any questions you have with your health care provider.

## 2014-09-12 NOTE — Progress Notes (Signed)
[facility] [address] [place]. [place] [address] [phone]       OFFICE CONSULT NOTE  [name] Date of Birth:  May 29, 1945 Medical Record Number:  [id number]   Referring MD:  Dr. Denese Killings  Reason for Referral:  Headache  HPI: 54 year Caucasian lady with new onset of daily headaches for the last 4 weeks. She describes the headaches as being bifrontal and bitemporal dull ache mostly 6/10 in severity but occasionally 10 out of 10 and severe. The pain is constant but does fluctuate. It does reduce to some degree if she sits still, uses warm or cold compression. She does admit to neck pain and tightness as well and she has some trouble moving her neck. She does have long-standing history of degenerative cervical spine disease with C2-3 bulging disc. She has been taking Advil which helps a little and takes the edge off. She is also on Dilaudid for back and neck pain which does not help. She does also admit to occasional sharp shooting pain down her spine and into her arms. She has seen Dr. Carloyn Manner neurosurgeon as well as   Dr. Janann Colonel in our office and was diagnosed to have degenerative cervical and lumbar spine disease. She does have bad rheumatoid arthritis and has been on Rituxan for years but recently stopped it as she became aware that she has peripheral neuropathy. She complains of paresthesias in her feet and diminished sensation from ankle down. She does have mild balance difficulties but has not had any major falls or injuries. She had a CT scan of the head performed on 07/21/14 which are personally reviewed and shows no acute pathology and only mild changes of chronic small vessel disease. She has had some recent GI workup for abdominal discomfort and pain but CT scan of the abdomen and pelvis on 12/04/13 was unremarkable  ROS:   14 system review of systems is positive for  ear pain, ringing in the ears, swollen abdomen and pain, nausea, frequent waking in the  night, allergies, joint pain and swelling, back pain, aching muscles, muscle cramps, walking difficulty, neck pain and stiffness, headache, numbness, depression  PMH:  Past Medical History  Diagnosis Date  . Rheumatoid arthritis(714.0)   . Hypertension   . Osteoarthritis   . Renal disease   . Degenerative disc disease   . Ovarian cancer   . Uterine cancer   . Melanoma   . Hyperlipidemia   . Fall     Social History:  History   Social History  . Marital Status: Married    Spouse Name: [name]  . Number of Children: 2  . Years of Education: 16   Occupational History  . Not on file.   Social History Main Topics  . Smoking status: Never Smoker   . Smokeless tobacco: Never Used  . Alcohol Use: No  . Drug Use: No  . Sexual Activity: Not on file   Other Topics Concern  . Not on file   Social History Narrative   Patient is married [name]) and lives with her husband.   Patient has two children.   Patient is retired.   Patient has a college education.   Patient is right handed.   Patient drinks 3-4 cups of coffee and tea daily.    Medications:   Current Outpatient Prescriptions on File Prior to Visit  Medication Sig Dispense Refill  . ALPRAZolam (XANAX) 0.5 MG tablet Take 0.5 mg by mouth every 12 (twelve)  hours as needed. For anxiety    . baclofen (LIORESAL) 10 MG tablet Take 1 tablet by mouth 3 (three) times daily.    . Biotin 1000 MCG tablet Take 1,000 mcg by mouth 2 (two) times daily as needed.      . ergocalciferol (VITAMIN D2) 50000 UNITS capsule Take 50,000 Units by mouth once a week.    . Flaxseed, Linseed, (FLAXSEED OIL) 1000 MG CAPS Take 1,000 mg by mouth 4 (four) times daily.     Marland Kitchen HYDROmorphone (DILAUDID) 4 MG tablet Take 1 tablet by mouth every 4 (four) hours as needed.    Marland Kitchen levofloxacin (LEVAQUIN) 750 MG tablet     . lidocaine (LIDODERM) 5 % Place 1 patch onto the skin daily as needed. FOR PAIN.    Remove & Discard patch within 12 hours or as directed by MD      . lidocaine (XYLOCAINE) 5 % ointment Apply 1 application topically as needed. FOR PAIN    . MAGNESIUM OXIDE PO Take 429 mg by mouth 2 (two) times daily.    . metoprolol tartrate (LOPRESSOR) 25 MG tablet Take 25 mg by mouth 2 (two) times daily.    . ondansetron (ZOFRAN ODT) 4 MG disintegrating tablet Take 1 tablet (4 mg total) by mouth every 8 (eight) hours as needed for nausea or vomiting. 12 tablet 2  . pantoprazole (PROTONIX) 40 MG tablet Take 40 mg by mouth daily.    . potassium chloride (KLOR-CON) 20 MEQ packet Take 20 mEq by mouth daily.    . promethazine (PHENERGAN) 25 MG tablet Take 1 tablet (25 mg total) by mouth every 6 (six) hours as needed for nausea or vomiting. 20 tablet 0  . Tofacitinib Citrate 5 MG TABS Take 1 tablet by mouth every morning.    . traZODone (DESYREL) 50 MG tablet Take 1 tablet by mouth at bedtime.    Marland Kitchen glipiZIDE (GLUCOTROL XL) 5 MG 24 hr tablet Take 5 mg by mouth 2 (two) times daily.     No current facility-administered medications on file prior to visit.    Allergies:   Allergies  Allergen Reactions  . Butalbital-Asa-Caff-Codeine [Fiorinal-Codeine] Anaphylaxis and Swelling  . Darvon Other (See Comments)    Irritable & hyper  . Ambien [Zolpidem Tartrate] Other (See Comments)    Sleep walking  . Ativan [Lorazepam] Other (See Comments)    Adverse reaction...made patient "act out"  . Ciprofloxacin Hcl     nausea  . Fiorinal [Butalbital-Aspirin-Caffeine]   . Penicillins   . Tape Other (See Comments)    Allergic to plastic tape. The adhesive causes redness to skin as well as loss of skin.    Physical Exam General: well developed, well nourished, seated, in no evident distress Head: head normocephalic and atraumatic.   Neck: supple with no carotid or supraclavicular bruits Cardiovascular: regular rate and rhythm, no murmurs Musculoskeletal: no deformity Skin:  no rash/petichiae Vascular:  Normal pulses all extremities  Neurologic Exam Mental Status:  Awake and fully alert. Oriented to place and time. Recent and remote memory intact. Attention span, concentration and fund of knowledge appropriate. Mood and affect appropriate.  Cranial Nerves: Fundoscopic exam reveals sharp disc margins. Pupils equal, briskly reactive to light. Extraocular movements full without nystagmus. Visual fields full to confrontation. Hearing intact. Facial sensation intact. Face, tongue, palate moves normally and symmetrically.  Motor: Normal bulk and tone. Normal strength in all tested extremity muscles. Sensory.: Diminished touch , pinprick , position and vibratory sensation from the  knee down..  Coordination: Rapid alternating movements normal in all extremities. Finger-to-nose and heel-to-shin performed accurately bilaterally. Gait and Station: Arises from chair without difficulty. Stance is stooped with favoring her back. Gait demonstrates normal stride length and balance . Able to heel, toe and tandem walk with slight difficulty.  Reflexes: 1+ and symmetric except both ankle jerks are absent.. Toes downgoing.       ASSESSMENT: 69 year Caucasian lady with new onset of bifrontal and temporal daily headaches likely tension headaches. She has pre-existing degenerative cervical spine disease and neck pain which may be contributing as well. She also has lower extremity paresthesias likely related to Rituxan related peripheral neuropathy    PLAN: I had a long discussion with the patient regarding her headaches and chronic neck pain. I advised her to do regular neck stretching exercises and to try tramadol for symptomatic relief and Topamax for headache prophylaxis. Check MRI scan of the brain and cervical spine. Return for follow-up in 2 months or call earlier if necessary.  Antony Contras, MD Note: This document was prepared with digital dictation and possible smart phrase technology. Any transcriptional errors that result from this process are unintentional.

## 2014-09-13 ENCOUNTER — Telehealth: Payer: Self-pay

## 2014-09-13 NOTE — Telephone Encounter (Signed)
Spoke with patient to explain her concern with pain medication.  Due to her allergy to codeine Dr. Leonie Man feels it will be difficult to prescribe her pain medication for Headaches; his suggestion was she try an extra strength tylenol.  Patient was ok with this and verbalized understanding.

## 2014-09-26 NOTE — Progress Notes (Signed)
Blood drawn for labs. Pt to be done on Sat. Drawn from right [facility], site is unremarkable and pt tolerated well.

## 2014-09-29 ENCOUNTER — Ambulatory Visit
Admission: RE | Admit: 2014-09-29 | Discharge: 2014-09-29 | Disposition: A | Discharge status: Home / Self Care | Payer: Medicare Other | Source: Ambulatory Visit | Attending: Neurology | Admitting: Neurology

## 2014-09-29 DIAGNOSIS — G44209 Tension-type headache, unspecified, not intractable: Secondary | ICD-10-CM

## 2014-09-29 MED ORDER — GADOBENATE DIMEGLUMINE 529 MG/ML IV SOLN
17.0000 mL | Freq: Once | INTRAVENOUS | Status: AC | PRN
  Administered 2014-09-29: 17 mL via INTRAVENOUS

## 2014-10-01 ENCOUNTER — Inpatient Hospital Stay
Admission: RE | Admit: 2014-10-01 | Discharge: 2014-10-01 | Disposition: A | Discharge status: Home / Self Care | Payer: Medicare Other | Source: Ambulatory Visit | Attending: Neurology | Admitting: Neurology

## 2014-10-01 ENCOUNTER — Other Ambulatory Visit: Payer: Self-pay | Admitting: Neurology

## 2014-10-01 ENCOUNTER — Other Ambulatory Visit: Payer: Medicare Other

## 2014-10-01 MED ORDER — TIZANIDINE HCL 2 MG PO CAPS: 2.0000 mg | ORAL_CAPSULE | Freq: Three times a day (TID) | ORAL | Status: DC | PRN

## 2014-10-03 ENCOUNTER — Telehealth: Payer: Self-pay | Admitting: Neurology

## 2014-10-03 MED ORDER — TIZANIDINE HCL 2 MG PO CAPS: ORAL_CAPSULE | ORAL | Status: DC

## 2014-10-03 NOTE — Telephone Encounter (Signed)
Jennifer @ [facility] in [place] is calling regarding a Rx for tizanidine (ZANAFLEX) 2 MG capsule that was to be called in. Please call to [facility] on [address] in [place]. Thank you.

## 2014-10-03 NOTE — Telephone Encounter (Signed)
Rx resent.  Receipt confirmed by pharmacy.

## 2014-10-10 NOTE — Telephone Encounter (Addendum)
Ins has been contacted and provided with clinical info.  Request is under review Ref # [id number]  [employer] [facility]) has approved the request for coverage on Tizanidine effective until 02/22/2015 Ref # [id number]

## 2014-10-10 NOTE — Telephone Encounter (Signed)
Patient is calling stating [facility] says her insurance needs prior authorization for medication tizanidine (ZANAFLEX) 2 MG capsule.

## 2014-11-19 ENCOUNTER — Ambulatory Visit: Payer: Medicare Other | Admitting: Neurology

## 2014-12-03 ENCOUNTER — Ambulatory Visit (INDEPENDENT_AMBULATORY_CARE_PROVIDER_SITE_OTHER): Payer: Medicare Other | Admitting: Neurology

## 2014-12-03 ENCOUNTER — Encounter: Payer: Self-pay | Admitting: Neurology

## 2014-12-03 VITALS — BP 136/79 | HR 68 | Ht 63.0 in | Wt 198.4 lb

## 2014-12-03 DIAGNOSIS — G44209 Tension-type headache, unspecified, not intractable: Secondary | ICD-10-CM

## 2014-12-03 MED ORDER — TOPIRAMATE 50 MG PO TABS: 25.0000 mg | ORAL_TABLET | Freq: Two times a day (BID) | ORAL | Status: DC

## 2014-12-03 NOTE — Progress Notes (Signed)
[facility] [address] [place] [id number] [phone]       OFFICE FOLLOW UP VISIT NOTE  Ms. Lynnell Catalan Date of Birth:  1945-05-12 Medical Record Number:  [id number]   Referring MD:  Dr. Denese Killings  Reason for Referral:  Headache  HPI: 66 year Caucasian lady with new onset of daily headaches for the last 4 weeks. She describes the headaches as being bifrontal and bitemporal dull ache mostly 6/10 in severity but occasionally 10 out of 10 and severe. The pain is constant but does fluctuate. It does reduce to some degree if she sits still, uses warm or cold compression. She does admit to neck pain and tightness as well and she has some trouble moving her neck. She does have long-standing history of degenerative cervical spine disease with C2-3 bulging disc. She has been taking Advil which helps a little and takes the edge off. She is also on Dilaudid for back and neck pain which does not help. She does also admit to occasional sharp shooting pain down her spine and into her arms. She has seen Dr. Carloyn Manner neurosurgeon as well as   Dr. Janann Colonel in our office and was diagnosed to have degenerative cervical and lumbar spine disease. She does have bad rheumatoid arthritis and has been on Rituxan for years but recently stopped it as she became aware that she has peripheral neuropathy. She complains of paresthesias in her feet and diminished sensation from ankle down. She does have mild balance difficulties but has not had any major falls or injuries. She had a CT scan of the head performed on 07/21/14 which are personally reviewed and shows no acute pathology and only mild changes of chronic small vessel disease. She has had some recent GI workup for abdominal discomfort and pain but CT scan of the abdomen and pelvis on 12/04/13 was unremarkable Update 12/03/2014 : She returns for follow-up after last visit with me 3 months ago. She reports some improvement in her headaches and  Topamax seems to be helping. She is currently taking 25 mg twice daily and tolerating it well without any side effects. She did not tolerate Zanaflex as it made her confused disorientation and a mind was in a fog and and she stopped it. She has been doing neck exercises everyday and that also seems to be helping. She had MRI scan of the brain on 8/7 for 17 which showed mild age-related changes of small vessel disease and an atrophy. No acute findings. MRI scan of cervical spine showed moderate degenerative changes at C 4-5 with bilateral foraminal narrowing and patient has seen Dr. Glenna Fellows neurosurgeon for that and he is considering C-spine surgery but wants patient's to first have a bone scan to check for osteoporosis. ROS:   14 system review of systems is positive for  fatigue, ringing in the ears, abdominal pain, constipation, nausea, frequent waking, headache, numbness, joint pain and swelling, back pain, aching muscles, walking difficulty, neck pain and stiffness, depression and all other systems negative PMH:  Past Medical History  Diagnosis Date  . Rheumatoid arthritis(714.0)   . Hypertension   . Osteoarthritis   . Renal disease   . Degenerative disc disease   . Ovarian cancer ([place])   . Uterine cancer ([place])   . Melanoma ([place])   . Hyperlipidemia   . Fall   . Headache     Social History:  Social History   Social History  . Marital Status: Married  Spouse Name: [name]  . Number of Children: 2  . Years of Education: 16   Occupational History  . Not on file.   Social History Main Topics  . Smoking status: Never Smoker   . Smokeless tobacco: Never Used  . Alcohol Use: No  . Drug Use: No  . Sexual Activity: Not on file   Other Topics Concern  . Not on file   Social History Narrative   Patient is married [name]) and lives with her husband.   Patient has two children.   Patient is retired.   Patient has a college education.   Patient is right handed.   Patient drinks 3-4  cups of coffee and tea daily.    Medications:   Current Outpatient Prescriptions on File Prior to Visit  Medication Sig Dispense Refill  . ALPRAZolam (XANAX) 0.5 MG tablet Take 0.5 mg by mouth every 12 (twelve) hours as needed. For anxiety    . Biotin 1000 MCG tablet Take 1,000 mcg by mouth 2 (two) times daily as needed.      . calcium carbonate (CALCIUM 600) 600 MG TABS tablet Take by mouth.    . Cholecalciferol (VITAMIN D3) 2000 UNITS capsule Take 1,000 Units by mouth daily.    . Cinnamon 500 MG capsule Take by mouth.    . clindamycin (CLEOCIN) 150 MG capsule Take 600 mg by mouth.    . ergocalciferol (VITAMIN D2) 50000 UNITS capsule Take 50,000 Units by mouth once a week.    . Flaxseed, Linseed, (FLAXSEED OIL) 1000 MG CAPS Take 1,000 mg by mouth 4 (four) times daily.     . folic acid (FOLVITE) 1 MG tablet Take 1 mg by mouth.    Marland Kitchen HYDROmorphone (DILAUDID) 4 MG tablet Take 1 tablet by mouth every 4 (four) hours as needed.    . lidocaine (LIDODERM) 5 % Place 1 patch onto the skin daily as needed. FOR PAIN.    Remove & Discard patch within 12 hours or as directed by MD    . lidocaine (XYLOCAINE) 5 % ointment Apply 1 application topically as needed. FOR PAIN    . metoprolol tartrate (LOPRESSOR) 25 MG tablet Take 25 mg by mouth 2 (two) times daily.    Marland Kitchen omeprazole (PRILOSEC) 10 MG capsule Take 20 mg by mouth daily.    . ondansetron (ZOFRAN ODT) 4 MG disintegrating tablet Take 1 tablet (4 mg total) by mouth every 8 (eight) hours as needed for nausea or vomiting. 12 tablet 2  . potassium chloride (KLOR-CON) 20 MEQ packet Take 20 mEq by mouth daily.    . promethazine (PHENERGAN) 25 MG tablet Take by mouth.    . pseudoephedrine (SUDAFED) 30 MG tablet Take 30 mg by mouth.    . RA KRILL OIL 500 MG CAPS Take by mouth.    . Tofacitinib Citrate 5 MG TABS Take 1 tablet by mouth every morning.    . traZODone (DESYREL) 50 MG tablet Take 1 tablet by mouth at bedtime.    . Turmeric (RA TURMERIC) 500 MG CAPS  Take by mouth.     No current facility-administered medications on file prior to visit.    Allergies:   Allergies  Allergen Reactions  . Butalbital-Asa-Caff-Codeine [Fiorinal-Codeine] Anaphylaxis and Swelling  . Darvon Other (See Comments)    Irritable & hyper  . Ambien [Zolpidem Tartrate] Other (See Comments)    Sleep walking  . Ativan [Lorazepam] Other (See Comments)    Adverse reaction...made patient "act out"  .  Ciprofloxacin Hcl     nausea  . Fiorinal [Butalbital-Aspirin-Caffeine]   . Penicillins   . Tape Other (See Comments)    Allergic to plastic tape. The adhesive causes redness to skin as well as loss of skin.    Physical Exam General: well developed, well nourished, seated, in no evident distress Head: head normocephalic and atraumatic.   Neck: supple with no carotid or supraclavicular bruits. Mild spasm of posterior neck and upper scapular muscles Cardiovascular: regular rate and rhythm, no murmurs Musculoskeletal: no deformity Skin:  no rash/petichiae Vascular:  Normal pulses all extremities  Neurologic Exam Mental Status: Awake and fully alert. Oriented to place and time. Recent and remote memory intact. Attention span, concentration and fund of knowledge appropriate. Mood and affect appropriate.  Cranial Nerves: Fundoscopic exam reveals sharp disc margins. Pupils equal, briskly reactive to light. Extraocular movements full without nystagmus. Visual fields full to confrontation. Hearing intact. Facial sensation intact. Face, tongue, palate moves normally and symmetrically.  Motor: Normal bulk and tone. Normal strength in all tested extremity muscles. Sensory.: Diminished touch , pinprick , position and vibratory sensation from the knee down..  Coordination: Rapid alternating movements normal in all extremities. Finger-to-nose and heel-to-shin performed accurately bilaterally. Gait and Station: Arises from chair without difficulty. Stance is stooped with favoring her  back. Gait demonstrates normal stride length and balance . Able to heel, toe and tandem walk with slight difficulty.  Reflexes: 1+ and symmetric except both ankle jerks are absent.. Toes downgoing.       ASSESSMENT: 50 year Caucasian lady with new onset of bifrontal and temporal daily headaches likely tension headaches. She has pre-existing degenerative cervical spine disease and neck pain which may be contributing as well. She also has lower extremity paresthesias likely related to Rituxan related peripheral neuropathy which are stable    PLAN:  I had a long discussion with the patient with regards to her is her tension headaches and encouraged her to continue to do regular neck stretching exercises and apply local heat or cold for short-term relief. Increase Topamax to 50 mg twice daily if tolerated to help with the headaches since it seems to be helping. She will keep follow-up with Dr. Carloyn Manner to discuss neck surgery. She will return for follow-up with me in 6 months or call earlier if necessary  Antony Contras, MD Note: This document was prepared with digital dictation and possible smart phrase technology. Any transcriptional errors that result from this process are unintentional.

## 2014-12-03 NOTE — Patient Instructions (Signed)
I had a long discussion with the patient with regards to her is her tension headaches and encouraged her to continue to do regular neck stretching exercises and apply local heat or cold for short-term relief. Increase Topamax to 50 mg twice daily if tolerated to help with the headaches since it seems to be helping. She will keep follow-up with Dr. Carloyn Manner to discuss neck surgery. She will return for follow-up with me in 6 months or call earlier if necessary

## 2014-12-04 ENCOUNTER — Other Ambulatory Visit: Payer: Self-pay | Admitting: Neurosurgery

## 2014-12-04 DIAGNOSIS — E2839 Other primary ovarian failure: Secondary | ICD-10-CM

## 2014-12-05 ENCOUNTER — Encounter: Payer: Self-pay | Admitting: Family Medicine

## 2014-12-27 ENCOUNTER — Other Ambulatory Visit: Payer: Self-pay

## 2014-12-27 DIAGNOSIS — Z1231 Encounter for screening mammogram for malignant neoplasm of breast: Secondary | ICD-10-CM

## 2015-01-07 ENCOUNTER — Other Ambulatory Visit: Payer: Medicare Other

## 2015-01-07 ENCOUNTER — Ambulatory Visit
Admission: RE | Admit: 2015-01-07 | Discharge: 2015-01-07 | Disposition: A | Discharge status: Home / Self Care | Payer: Medicare Other | Source: Ambulatory Visit | Attending: Neurosurgery | Admitting: Neurosurgery

## 2015-01-07 DIAGNOSIS — E2839 Other primary ovarian failure: Secondary | ICD-10-CM

## 2015-01-14 ENCOUNTER — Ambulatory Visit: Payer: Medicare Other

## 2015-04-07 DIAGNOSIS — R51 Headache: Secondary | ICD-10-CM

## 2015-04-07 DIAGNOSIS — G4486 Cervicogenic headache: Secondary | ICD-10-CM | POA: Insufficient documentation

## 2015-06-03 ENCOUNTER — Other Ambulatory Visit: Payer: Self-pay | Admitting: Neurology

## 2015-06-03 ENCOUNTER — Ambulatory Visit: Payer: Medicare Other | Admitting: Neurology

## 2015-06-17 ENCOUNTER — Ambulatory Visit
Admission: RE | Admit: 2015-06-17 | Discharge: 2015-06-17 | Disposition: A | Discharge status: Home / Self Care | Payer: Medicare Other | Source: Ambulatory Visit | Attending: Nurse Practitioner | Admitting: Nurse Practitioner

## 2015-06-17 ENCOUNTER — Other Ambulatory Visit: Payer: Self-pay | Admitting: Nurse Practitioner

## 2015-06-17 DIAGNOSIS — M545 Low back pain: Secondary | ICD-10-CM

## 2015-06-23 LAB — HM MAMMOGRAPHY: HM Mammogram: NORMAL (ref 0–4)

## 2015-07-01 ENCOUNTER — Emergency Department (HOSPITAL_BASED_OUTPATIENT_CLINIC_OR_DEPARTMENT_OTHER)
Admission: EM | Admit: 2015-07-01 | Discharge: 2015-07-01 | Disposition: A | Discharge status: Home / Self Care | Payer: Medicare Other | Attending: Emergency Medicine | Admitting: Emergency Medicine

## 2015-07-01 ENCOUNTER — Encounter (HOSPITAL_BASED_OUTPATIENT_CLINIC_OR_DEPARTMENT_OTHER): Payer: Self-pay | Admitting: *Deleted

## 2015-07-01 DIAGNOSIS — Z8543 Personal history of malignant neoplasm of ovary: Secondary | ICD-10-CM | POA: Insufficient documentation

## 2015-07-01 DIAGNOSIS — E785 Hyperlipidemia, unspecified: Secondary | ICD-10-CM | POA: Insufficient documentation

## 2015-07-01 DIAGNOSIS — I10 Essential (primary) hypertension: Secondary | ICD-10-CM | POA: Diagnosis not present

## 2015-07-01 DIAGNOSIS — H9201 Otalgia, right ear: Secondary | ICD-10-CM | POA: Diagnosis present

## 2015-07-01 DIAGNOSIS — H66001 Acute suppurative otitis media without spontaneous rupture of ear drum, right ear: Secondary | ICD-10-CM | POA: Diagnosis not present

## 2015-07-01 DIAGNOSIS — Z79899 Other long term (current) drug therapy: Secondary | ICD-10-CM | POA: Insufficient documentation

## 2015-07-01 DIAGNOSIS — M069 Rheumatoid arthritis, unspecified: Secondary | ICD-10-CM | POA: Insufficient documentation

## 2015-07-01 MED ORDER — AZITHROMYCIN 250 MG PO TABS: ORAL_TABLET | ORAL | Status: DC

## 2015-07-01 MED FILL — AZITHROMYCIN 250 MG TABLET: 250 | 5 days supply | Qty: 6 | Fill #0

## 2015-07-01 NOTE — Discharge Instructions (Signed)
Zithromax as prescribed.  Ibuprofen 600 mg every 6 hours as needed for pain.  Follow-up with your primary Dr. if not improving in the next week, and return to the ER if symptoms significantly worsen or change.   Otitis Media, Adult Otitis media is redness, soreness, and puffiness (swelling) in the space just behind your eardrum (middle ear). It may be caused by allergies or infection. It often happens along with a cold. HOME CARE  Take your medicine as told. Finish it even if you start to feel better.  Only take over-the-counter or prescription medicines for pain, discomfort, or fever as told by your doctor.  Follow up with your doctor as told. GET HELP IF:  You have otitis media only in one ear, or bleeding from your nose, or both.  You notice a lump on your neck.  You are not getting better in 3-5 days.  You feel worse instead of better. GET HELP RIGHT AWAY IF:   You have pain that is not helped with medicine.  You have puffiness, redness, or pain around your ear.  You get a stiff neck.  You cannot move part of your face (paralysis).  You notice that the bone behind your ear hurts when you touch it. MAKE SURE YOU:   Understand these instructions.  Will watch your condition.  Will get help right away if you are not doing well or get worse.   This information is not intended to replace advice given to you by your health care provider. Make sure you discuss any questions you have with your health care provider.   Document Released: 07/28/2007 Document Revised: 03/01/2014 Document Reviewed: 09/05/2012 Elsevier Interactive Patient Education [employer].

## 2015-07-01 NOTE — ED Provider Notes (Signed)
CSN: [id number]     Arrival date & time 07/01/15  1417 History   First MD Initiated Contact with Patient 07/01/15 1538     Chief Complaint  Patient presents with  . Otalgia     (Consider location/radiation/quality/duration/timing/severity/associated sxs/prior Treatment) HPI Comments: Patient is 70 year old female with history of rheumatoid arthritis. She presents for evaluation of right ear pain. This is been worsening over the past week and she reports ringing in both years. She feels as though her hearing is becoming muffled in the right.  Patient is a 70 y.o. female presenting with ear pain. The history is provided by the patient.  Otalgia Location:  Right Behind ear:  No abnormality Quality:  Pressure Severity:  Moderate Onset quality:  Sudden Duration:  1 week Timing:  Constant Progression:  Worsening Chronicity:  New Relieved by:  Nothing Worsened by:  Nothing tried Ineffective treatments:  None tried Associated symptoms: no congestion, no cough and no fever     Past Medical History  Diagnosis Date  . Rheumatoid arthritis(714.0)   . Hypertension   . Osteoarthritis   . Renal disease   . Degenerative disc disease   . Ovarian cancer ([place])   . Uterine cancer ([place])   . Melanoma ([place])   . Hyperlipidemia   . Fall   . Headache    Past Surgical History  Procedure Laterality Date  . Cholecystectomy    . Mastectomy, partial    . Tonsillectomy    . Appendectomy    . Right knee replacement    . Operative hysteroscopy    . Back surgery    . Cervical spine surgery    . Abdominal hysterectomy      partial  . Breast surgery Bilateral     benign fatty tumor removed  . Carpal tunnel release Right    Family History  Problem Relation Age of Onset  . Cancer Mother   . Alzheimer's disease Mother   . Cancer Father   . Heart disease Maternal Grandmother   . Cancer Maternal Grandfather   . Cancer Paternal Grandmother   . Cancer Paternal Grandfather    Social History   Substance Use Topics  . Smoking status: Never Smoker   . Smokeless tobacco: Never Used  . Alcohol Use: No   OB History    No data available     Review of Systems  Constitutional: Negative for fever.  HENT: Positive for ear pain. Negative for congestion.   Respiratory: Negative for cough.   All other systems reviewed and are negative.     Allergies  Butalbital-asa-caff-codeine; Darvon; Ambien; Ativan; Ciprofloxacin hcl; Fiorinal; Penicillins; and Tape  Home Medications   Prior to Admission medications   Medication Sig Start Date End Date Taking? Authorizing Provider  baclofen (LIORESAL) 10 MG tablet Take 10 mg by mouth 3 (three) times daily.   Yes Historical Provider, MD  denosumab (PROLIA) 60 MG/ML SOLN injection Inject 60 mg into the skin every 6 (six) months. Administer in upper arm, thigh, or abdomen   Yes Historical Provider, MD  docusate sodium (COLACE) 50 MG capsule Take 50 mg by mouth 2 (two) times daily.   Yes Historical Provider, MD  ALPRAZolam Duanne Moron) 0.5 MG tablet Take 0.5 mg by mouth every 12 (twelve) hours as needed. For anxiety    Historical Provider, MD  Biotin 1000 MCG tablet Take 1,000 mcg by mouth 2 (two) times daily as needed.      Historical Provider, MD  calcium carbonate (CALCIUM  600) 600 MG TABS tablet Take by mouth.    Historical Provider, MD  Cholecalciferol (VITAMIN D3) 2000 UNITS capsule Take 1,000 Units by mouth daily.    Historical Provider, MD  Cinnamon 500 MG capsule Take by mouth.    Historical Provider, MD  clindamycin (CLEOCIN) 150 MG capsule Take 600 mg by mouth.    Historical Provider, MD  ergocalciferol (VITAMIN D2) 50000 UNITS capsule Take 50,000 Units by mouth once a week.    Historical Provider, MD  Flaxseed, Linseed, (FLAXSEED OIL) 1000 MG CAPS Take 1,000 mg by mouth 4 (four) times daily.     Historical Provider, MD  folic acid (FOLVITE) 1 MG tablet Take 1 mg by mouth.    Historical Provider, MD  HYDROmorphone (DILAUDID) 4 MG tablet Take  1 tablet by mouth every 4 (four) hours as needed. 03/29/12   Historical Provider, MD  lidocaine (LIDODERM) 5 % Place 1 patch onto the skin daily as needed. FOR PAIN.    Remove & Discard patch within 12 hours or as directed by MD    Historical Provider, MD  lidocaine (XYLOCAINE) 5 % ointment Apply 1 application topically as needed. FOR PAIN    Historical Provider, MD  metoprolol tartrate (LOPRESSOR) 25 MG tablet Take 25 mg by mouth 2 (two) times daily.    Historical Provider, MD  omeprazole (PRILOSEC) 10 MG capsule Take 20 mg by mouth daily.    Historical Provider, MD  ondansetron (ZOFRAN ODT) 4 MG disintegrating tablet Take 1 tablet (4 mg total) by mouth every 8 (eight) hours as needed for nausea or vomiting. 12/04/13   Fredia Sorrow, MD  potassium chloride (KLOR-CON) 20 MEQ packet Take 20 mEq by mouth daily.    Historical Provider, MD  promethazine (PHENERGAN) 25 MG tablet Take by mouth. 12/04/13   Historical Provider, MD  pseudoephedrine (SUDAFED) 30 MG tablet Take 30 mg by mouth.    Historical Provider, MD  RA KRILL OIL 500 MG CAPS Take by mouth.    Historical Provider, MD  Tofacitinib Citrate 5 MG TABS Take 1 tablet by mouth every morning.    Historical Provider, MD  topiramate (TOPAMAX) 25 MG tablet TAKE 1 TABLET(25 MG) BY MOUTH TWICE DAILY 06/03/15   Garvin Fila, MD  topiramate (TOPAMAX) 50 MG tablet Take 0.5 tablets (25 mg total) by mouth 2 (two) times daily. 12/03/14   Garvin Fila, MD  traZODone (DESYREL) 50 MG tablet Take 1 tablet by mouth at bedtime. 12/15/12   Historical Provider, MD  Turmeric (RA TURMERIC) 500 MG CAPS Take by mouth.    Historical Provider, MD   BP 156/81 mmHg  Pulse 80  Temp(Src) 98.5 F (36.9 C) (Oral)  Resp 18  Ht 5\' 3"  (1.6 m)  Wt 180 lb (81.647 kg)  BMI 31.89 kg/m2  SpO2 99% Physical Exam  Constitutional: She is oriented to person, place, and time. She appears well-developed and well-nourished. No distress.  HENT:  Head: Normocephalic and atraumatic.   Mouth/Throat: Oropharynx is clear and moist.  The left TM appears clear. The right TM is erythematous with fluid in the middle ear.  Neck: Normal range of motion. Neck supple.  Lymphadenopathy:    She has no cervical adenopathy.  Neurological: She is alert and oriented to person, place, and time.  Skin: Skin is warm and dry. She is not diaphoretic.  Nursing note and vitals reviewed.   ED Course  Procedures (including critical care time) Labs Review Labs Reviewed - No data to  display  Imaging Review No results found. I have personally reviewed and evaluated these images and lab results as part of my medical decision-making.   EKG Interpretation None      MDM   Final diagnoses:  None    This appears to be a developing middle ear infection. She will be treated with Zithromax and when necessary follow-up.    Veryl Speak, MD 07/01/15 [phone]

## 2015-07-01 NOTE — ED Notes (Signed)
Ears have been hurting for a week.

## 2015-07-18 ENCOUNTER — Ambulatory Visit: Payer: Medicare Other | Admitting: Neurology

## 2015-07-22 ENCOUNTER — Encounter: Payer: Self-pay | Admitting: Neurology

## 2015-09-16 ENCOUNTER — Encounter: Payer: Self-pay | Admitting: Family Medicine

## 2015-09-23 ENCOUNTER — Ambulatory Visit (INDEPENDENT_AMBULATORY_CARE_PROVIDER_SITE_OTHER): Payer: Medicare Other | Admitting: Neurology

## 2015-09-23 ENCOUNTER — Encounter: Payer: Self-pay | Admitting: Neurology

## 2015-09-23 VITALS — BP 127/88 | HR 75 | Ht 63.0 in | Wt 193.0 lb

## 2015-09-23 DIAGNOSIS — G44209 Tension-type headache, unspecified, not intractable: Secondary | ICD-10-CM | POA: Diagnosis not present

## 2015-09-23 MED ORDER — TOPIRAMATE 25 MG PO TABS: 50.0000 mg | ORAL_TABLET | Freq: Two times a day (BID) | ORAL | 3 refills | Status: AC

## 2015-09-23 NOTE — Progress Notes (Signed)
[facility] [address] [place]. [place] [id number] [phone]       OFFICE FOLLOW UP VISIT NOTE  [name] Date of Birth:  16-Mar-1945 Medical Record Number:  [id number]   Referring MD:  Dr. Denese Patel  Reason for Referral:  Headache  HPI: 25 year Caucasian lady with new onset of daily headaches for the last 4 weeks. She describes the headaches as being bifrontal and bitemporal dull ache mostly 6/10 in severity but occasionally 10 out of 10 and severe. The pain is constant but does fluctuate. It does reduce to some degree if she sits still, uses warm or cold compression. She does admit to neck pain and tightness as well and she has some trouble moving her neck. She does have long-standing history of degenerative cervical spine disease with C2-3 bulging disc. She has been taking Advil which helps a little and takes the edge off. She is also on Dilaudid for back and neck pain which does not help. She does also admit to occasional sharp shooting pain down her spine and into her arms. She has seen Dr. Carloyn Patel neurosurgeon as well as   Dr. Janann Patel in our office and was diagnosed to have degenerative cervical and lumbar spine disease. She does have bad rheumatoid arthritis and has been on Rituxan for years but recently stopped it as she became aware that she has peripheral neuropathy. She complains of paresthesias in her feet and diminished sensation from ankle down. She does have mild balance difficulties but has not had any major falls or injuries. She had a CT scan of the head performed on 07/21/14 which are personally reviewed and shows no acute pathology and only mild changes of chronic small vessel disease. She has had some recent GI workup for abdominal discomfort and pain but CT scan of the abdomen and pelvis on 12/04/13 was unremarkable Update 12/03/2014 : She returns for follow-up after last visit with me 3 months ago. She reports some improvement in her headaches and  Topamax seems to be helping. She is currently taking 25 mg twice daily and tolerating it well without any side effects. She did not tolerate Zanaflex as it made her confused disorientation and a mind was in a fog and and she stopped it. She has been doing neck exercises everyday and that also seems to be helping. She had MRI scan of the brain on 8/7 for 17 which showed mild age-related changes of small vessel disease and an atrophy. No acute findings. MRI scan of cervical spine showed moderate degenerative changes at C 4-5 with bilateral foraminal narrowing and patient has seen Dr. Glenna Patel neurosurgeon for that and he is considering C-spine surgery but wants patient's to first have a bone scan to check for osteoporosis. Update 09/23/2015 : She returns for follow-up after last visit in October 2016. She can still continues to have headaches but feels that the Topamax seems to be helping. She still has to do for minor headaches are weak. She feels however that the numbness seems to have improved with Topamax. She did not increase the dose to 50 mg twice daily as a suggested in the last visit for unclear reason. She was seen by Dr. Carloyn Patel who decided not to operate on her neck as she had osteoporosis and she is currently getting injections to improve her bone density. She was trying to see a pain specialist for injections in [place] but he has left the area. He has been started on  a new medication for arthritis in the last couple of months and feels that her lower extremity pain and paresthesias and redness have been worsened. She has been doing regular neck stretching exercises. She has been careful with her walking and has had no falls or injuries. ROS:   14 system review of systems is positive for  fatigue,  Ringing in the ears, swollen abdomen, abdominal pain, constipation, nausea, frequent waking, frequency of urination, joint pain and swelling, back pain, aching muscles, muscle cramps, walking difficulty, neck  pain and stiffness, headache, numbness, depression and all other systems negative all other systems negative PMH:  Past Medical History:  Diagnosis Date  . Degenerative disc disease   . Fall   . Headache   . Hyperlipidemia   . Hypertension   . Melanoma ([place])   . Osteoarthritis   . Ovarian cancer ([place])   . Renal disease   . Rheumatoid arthritis(714.0)   . Uterine cancer [facility])     Social History:  Social History   Social History  . Marital status: Married    Spouse name: Kristina Patel  . Number of children: 2  . Years of education: 16   Occupational History  . Not on file.   Social History Main Topics  . Smoking status: Never Smoker  . Smokeless tobacco: Never Used  . Alcohol use No  . Drug use: No  . Sexual activity: Not on file   Other Topics Concern  . Not on file   Social History Narrative   Patient is married Kristina Patel) and lives with her husband.   Patient has two children.   Patient is retired.   Patient has a college education.   Patient is right handed.   Patient drinks 3-4 cups of coffee and tea daily.    Medications:   Current Outpatient Prescriptions on File Prior to Visit  Medication Sig Dispense Refill  . ALPRAZolam (XANAX) 0.5 MG tablet Take 0.5 mg by mouth every 12 (twelve) hours as needed. For anxiety    . Biotin 1000 MCG tablet Take 1,000 mcg by mouth 2 (two) times daily as needed.      . Cholecalciferol (VITAMIN D3) 2000 UNITS capsule Take 1,000 Units by mouth daily.    . Cinnamon 500 MG capsule Take by mouth.    . clindamycin (CLEOCIN) 150 MG capsule Take 600 mg by mouth.    . denosumab (PROLIA) 60 MG/ML SOLN injection Inject 60 mg into the skin every 6 (six) months. Administer in upper arm, thigh, or abdomen    . docusate sodium (COLACE) 50 MG capsule Take 50 mg by mouth 2 (two) times daily.    . Flaxseed, Linseed, (FLAXSEED OIL) 1000 MG CAPS Take 1,000 mg by mouth 4 (four) times daily.     . folic acid (FOLVITE) 1 MG tablet Take 1 mg by mouth.    Marland Kitchen  HYDROmorphone (DILAUDID) 4 MG tablet Take 1 tablet by mouth every 4 (four) hours as needed.    . lidocaine (LIDODERM) 5 % Place 1 patch onto the skin daily as needed. FOR PAIN.    Remove & Discard patch within 12 hours or as directed by MD    . lidocaine (XYLOCAINE) 5 % ointment Apply 1 application topically as needed. FOR PAIN    . metoprolol tartrate (LOPRESSOR) 25 MG tablet Take 25 mg by mouth 2 (two) times daily.    Marland Kitchen omeprazole (PRILOSEC) 10 MG capsule Take 20 mg by mouth daily.    . promethazine (  PHENERGAN) 25 MG tablet Take by mouth.    . pseudoephedrine (SUDAFED) 30 MG tablet Take 30 mg by mouth.    . RA KRILL OIL 500 MG CAPS Take by mouth.    . Tofacitinib Citrate 5 MG TABS Take 1 tablet by mouth every morning.    . Turmeric (RA TURMERIC) 500 MG CAPS Take by mouth.     No current facility-administered medications on file prior to visit.     Allergies:   Allergies  Allergen Reactions  . Butalbital-Asa-Caff-Codeine [Fiorinal-Codeine] Anaphylaxis and Swelling  . Darvon Other (See Comments)    Irritable & hyper  . Ambien [Zolpidem Tartrate] Other (See Comments)    Sleep walking  . Ativan [Lorazepam] Other (See Comments)    Adverse reaction...made patient "act out"  . Ciprofloxacin Hcl     nausea  . Fiorinal [Butalbital-Aspirin-Caffeine]   . Penicillins   . Propoxyphene Other (See Comments)    unknown  . Tape Other (See Comments)    Allergic to plastic tape. The adhesive causes redness to skin as well as loss of skin.  . Benzodiazepines Cough    Physical Exam General: well developed, well nourished, middle aged caucasian lady seated, in no evident distress Head: head normocephalic and atraumatic.   Neck: supple with no carotid or supraclavicular bruits. Mild spasm of posterior neck and upper scapular muscles Cardiovascular: regular rate and rhythm, no murmurs Musculoskeletal: no deformity Skin:  no rash/petichiae Vascular:  Normal pulses all extremities  Neurologic  Exam Mental Status: Awake and fully alert. Oriented to place and time. Recent and remote memory intact. Attention span, concentration and fund of knowledge appropriate. Mood and affect appropriate.  Cranial Nerves: Fundoscopic exam not done.  . Pupils equal, briskly reactive to light. Extraocular movements full without nystagmus. Visual fields full to confrontation. Hearing intact. Facial sensation intact. Face, tongue, palate moves normally and symmetrically.  Motor: Normal bulk and tone. Normal strength in all tested extremity muscles. Sensory.: Diminished touch , pinprick , position and vibratory sensation from the knee down..  Coordination: Rapid alternating movements normal in all extremities. Finger-to-nose and heel-to-shin performed accurately bilaterally. Gait and Station: Arises from chair without difficulty. Stance is stooped with favoring her back. Gait demonstrates normal stride length and balance . Able to heel, toe and tandem walk with slight difficulty.  Reflexes: 1+ and symmetric except both ankle jerks are absent.. Toes downgoing.       ASSESSMENT: 33 year Caucasian lady with new onset of bifrontal and temporal daily headaches likely tension headaches. She has pre-existing degenerative cervical spine disease and neck pain which may be contributing as well. She also has lower extremity paresthesias likely related to Rituxan related peripheral neuropathy which are stable    PLAN:  I had a long discussion with the patient with regards to her is her tension headaches and encouraged her to continue to do regular neck stretching exercises and apply local heat or cold for short-term relief. Increase Topamax to 50 mg twice daily if tolerated to help with the headaches as well as lower extremity paresthesias.. She will   follow-up with Dr. Carloyn Patel to discuss referral to pain clinic for injections. Greater than 50% time during this 25 minute visit was spent on counseling and coordination of care  about tension headaches and neck pain She will return for follow-up with my nurse practitioner in 3  months or call earlier if necessary  Antony Contras, MD Note: This document was prepared with digital dictation and possible smart  Company secretary. Any transcriptional errors that result from this process are unintentional.

## 2015-09-23 NOTE — Patient Instructions (Signed)
had a long discussion with the patient with regards to her  tension headaches and encouraged her to continue to do regular neck stretching exercises and apply local heat or cold for short-term relief. Increase Topamax to 50 mg twice daily if tolerated to help with the headaches since it seems to be helping. She will  follow-up with Dr. Carloyn Manner to discuss referral for neck injections at pain clinic. She will return for follow-up with me in 3 months with Hoyle Sauer or Jinny Blossom, nurse practitioner or call earlier if necessary  Topiramate tablets What is this medicine? TOPIRAMATE (toe PYRE a mate) is used to treat seizures in adults or children with epilepsy. It is also used for the prevention of migraine headaches. This medicine may be used for other purposes; ask your health care provider or pharmacist if you have questions. What should I tell my health care provider before I take this medicine? They need to know if you have any of these conditions: -bleeding disorders -cirrhosis of the liver or liver disease -diarrhea -glaucoma -kidney stones or kidney disease -low blood counts, like low white cell, platelet, or red cell counts -lung disease like asthma, obstructive pulmonary disease, emphysema -metabolic acidosis -on a ketogenic diet -schedule for surgery or a procedure -suicidal thoughts, plans, or attempt; a previous suicide attempt by you or a family member -an unusual or allergic reaction to topiramate, other medicines, foods, dyes, or preservatives -pregnant or trying to get pregnant -breast-feeding How should I use this medicine? Take this medicine by mouth with a glass of water. Follow the directions on the prescription label. Do not crush or chew. You may take this medicine with meals. Take your medicine at regular intervals. Do not take it more often than directed. Talk to your pediatrician regarding the use of this medicine in children. Special care may be needed. While this drug may be  prescribed for children as young as 62 years of age for selected conditions, precautions do apply. Overdosage: If you think you have taken too much of this medicine contact a poison control center or emergency room at once. NOTE: This medicine is only for you. Do not share this medicine with others. What if I miss a dose? If you miss a dose, take it as soon as you can. If your next dose is to be taken in less than 6 hours, then do not take the missed dose. Take the next dose at your regular time. Do not take double or extra doses. What may interact with this medicine? Do not take this medicine with any of the following medications: -probenecid This medicine may also interact with the following medications: -acetazolamide -alcohol -amitriptyline -aspirin and aspirin-like medicines -birth control pills -certain medicines for depression -certain medicines for seizures -certain medicines that treat or prevent blood clots like warfarin, enoxaparin, dalteparin, apixaban, dabigatran, and rivaroxaban -digoxin -hydrochlorothiazide -lithium -medicines for pain, sleep, or muscle relaxation -metformin -methazolamide -NSAIDS, medicines for pain and inflammation, like ibuprofen or naproxen -pioglitazone -risperidone This list may not describe all possible interactions. Give your health care provider a list of all the medicines, herbs, non-prescription drugs, or dietary supplements you use. Also tell them if you smoke, drink alcohol, or use illegal drugs. Some items may interact with your medicine. What should I watch for while using this medicine? Visit your doctor or health care professional for regular checks on your progress. Do not stop taking this medicine suddenly. This increases the risk of seizures if you are using this medicine to  control epilepsy. Wear a medical identification bracelet or chain to say you have epilepsy or seizures, and carry a card that lists all your medicines. This medicine  can decrease sweating and increase your body temperature. Watch for signs of deceased sweating or fever, especially in children. Avoid extreme heat, hot baths, and saunas. Be careful about exercising, especially in hot weather. Contact your health care provider right away if you notice a fever or decrease in sweating. You should drink plenty of fluids while taking this medicine. If you have had kidney stones in the past, this will help to reduce your chances of forming kidney stones. If you have stomach pain, with nausea or vomiting and yellowing of your eyes or skin, call your doctor immediately. You may get drowsy, dizzy, or have blurred vision. Do not drive, use machinery, or do anything that needs mental alertness until you know how this medicine affects you. To reduce dizziness, do not sit or stand up quickly, especially if you are an older patient. Alcohol can increase drowsiness and dizziness. Avoid alcoholic drinks. If you notice blurred vision, eye pain, or other eye problems, seek medical attention at once for an eye exam. The use of this medicine may increase the chance of suicidal thoughts or actions. Pay special attention to how you are responding while on this medicine. Any worsening of mood, or thoughts of suicide or dying should be reported to your health care professional right away. This medicine may increase the chance of developing metabolic acidosis. If left untreated, this can cause kidney stones, bone disease, or slowed growth in children. Symptoms include breathing fast, fatigue, loss of appetite, irregular heartbeat, or loss of consciousness. Call your doctor immediately if you experience any of these side effects. Also, tell your doctor about any surgery you plan on having while taking this medicine since this may increase your risk for metabolic acidosis. Birth control pills may not work properly while you are taking this medicine. Talk to your doctor about using an extra method of  birth control. Women who become pregnant while using this medicine may enroll in the [place] Pregnancy Registry by calling [phone]. This registry collects information about the safety of antiepileptic drug use during pregnancy. What side effects may I notice from receiving this medicine? Side effects that you should report to your doctor or health care professional as soon as possible: -allergic reactions like skin rash, itching or hives, swelling of the face, lips, or tongue -decreased sweating and/or rise in body temperature -depression -difficulty breathing, fast or irregular breathing patterns -difficulty speaking -difficulty walking or controlling muscle movements -hearing impairment -redness, blistering, peeling or loosening of the skin, including inside the mouth -tingling, pain or numbness in the hands or feet -unusual bleeding or bruising -unusually weak or tired -worsening of mood, thoughts or actions of suicide or dying Side effects that usually do not require medical attention (report to your doctor or health care professional if they continue or are bothersome): -altered taste -back pain, joint or muscle aches and pains -diarrhea, or constipation -headache -loss of appetite -nausea -stomach upset, indigestion -tremors This list may not describe all possible side effects. Call your doctor for medical advice about side effects. You may report side effects to FDA at 1-800-FDA-1088. Where should I keep my medicine? Keep out of the reach of children. Store at room temperature between 15 and 30 degrees C (59 and 86 degrees F) in a tightly closed container. Protect from moisture. Throw away  any unused medicine after the expiration date. NOTE: This sheet is a summary. It may not cover all possible information. If you have questions about this medicine, talk to your doctor, pharmacist, or health care provider.    2016, [employer] Standard.  (2013-02-12 23:17:57)

## 2015-09-30 ENCOUNTER — Encounter: Payer: Self-pay | Admitting: Family Medicine

## 2015-12-24 ENCOUNTER — Ambulatory Visit: Payer: Medicare Other | Admitting: Nurse Practitioner

## 2016-01-09 ENCOUNTER — Encounter: Payer: Self-pay | Admitting: Family Medicine

## 2016-01-23 ENCOUNTER — Ambulatory Visit (INDEPENDENT_AMBULATORY_CARE_PROVIDER_SITE_OTHER): Payer: Medicare Other | Admitting: Family Medicine

## 2016-01-23 ENCOUNTER — Encounter: Payer: Self-pay | Admitting: Family Medicine

## 2016-01-23 VITALS — BP 148/90 | HR 75 | Temp 98.2°F | Ht 62.0 in | Wt 192.2 lb

## 2016-01-23 DIAGNOSIS — R140 Abdominal distension (gaseous): Secondary | ICD-10-CM

## 2016-01-23 DIAGNOSIS — Z23 Encounter for immunization: Secondary | ICD-10-CM | POA: Diagnosis not present

## 2016-01-23 DIAGNOSIS — M069 Rheumatoid arthritis, unspecified: Secondary | ICD-10-CM

## 2016-01-23 DIAGNOSIS — K5909 Other constipation: Secondary | ICD-10-CM | POA: Diagnosis not present

## 2016-01-23 MED ORDER — METHYLPREDNISOLONE SODIUM SUCC 125 MG IJ SOLR: 125.0000 mg | Freq: Once | INTRAMUSCULAR | 0 refills | Status: DC

## 2016-01-23 MED ORDER — METHYLPREDNISOLONE SODIUM SUCC 125 MG IJ SOLR
125.0000 mg | Freq: Once | INTRAMUSCULAR | Status: AC
  Administered 2016-01-23: 125 mg via INTRAMUSCULAR

## 2016-01-23 NOTE — Progress Notes (Signed)
Pre visit review using our clinic review tool, if applicable. No additional management support is needed unless otherwise documented below in the visit note. 

## 2016-01-23 NOTE — Patient Instructions (Addendum)
We will contact you regarding your referrals.  Stick with your [place] physician for your anxiety.  Choose which medical issues you would like each office to handle. What we don't want is for our office and the [place] clinic to be taking care of the same issue.  You can try an enema (1 over the next 1-3 days) and start taking MiraLax 1-2 times daily. If you start having symptoms from before when you were doing weekly cleanses, stop this. Keep well hydrated and eat lots of fruits and veggies.

## 2016-01-23 NOTE — Progress Notes (Signed)
Chief Complaint  Patient presents with  . Establish Care    Pt has history of rheumatoid arthritis and has been flaring up lately       New Patient Visit SUBJECTIVE: HPI: Kristina Patel is an 70 y.o.female who is being seen for establishing care.  The patient was previously seen at [place].   Hx of RA- 2004. Rheumatologist changed regimen and is now retiring through the [place]. Having issues with flares. Used to see Dr. Sonda Primes then [facility] to [facility]. Rituxan was started. Was changed from methotrexate to another medicine. New infusion for 5 mo. 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   Abd pain- gets colonoscopies every 3 years, last one in 2015. Constipation, bloating, was told to take laxatives. Will have a bowel movement every 5 days. It is painful and hard. +Hx of hemorrhoids, went to [facility] ER and confirmed that it was hemorrhoids. She sees GI through [facility] and had a recent change in providers. Her new provider had placed her on a weekly colon cleanse with MiraLax and she had adverse effects.   Allergies  Allergen Reactions  . Butalbital-Asa-Caff-Codeine [Fiorinal-Codeine] Anaphylaxis and Swelling  . Darvon Other (See Comments)    Irritable & hyper  . Ambien [Zolpidem Tartrate] Other (See Comments)    Sleep walking  . Ativan [Lorazepam] Other (See Comments)    Adverse reaction...made patient "act out"  . Ciprofloxacin Hcl     nausea  . Fiorinal [Butalbital-Aspirin-Caffeine]   . Penicillins   . Propoxyphene Other (See Comments)    unknown  . Tape Other (See Comments)    Allergic to plastic tape. The adhesive causes redness to skin as well as loss of skin.  . Benzodiazepines Cough    Past Medical History:  Diagnosis Date  . Degenerative disc disease   . Fall   . Headache   . Hyperlipidemia   . Hypertension   . Melanoma ([place])   . Osteoarthritis   . Ovarian cancer ([place])   . Renal disease   . Rheumatoid arthritis(714.0)   . Uterine cancer [facility])    Past  Surgical History:  Procedure Laterality Date  . ABDOMINAL HYSTERECTOMY     partial  . APPENDECTOMY    . BACK SURGERY    . BREAST SURGERY Bilateral    benign fatty tumor removed  . CARPAL TUNNEL RELEASE Right   . CERVICAL SPINE SURGERY    . CHOLECYSTECTOMY    . MASTECTOMY, PARTIAL    . OPERATIVE HYSTEROSCOPY    . right knee replacement    . TONSILLECTOMY     Social History   Social History  . Marital status: Married    Spouse name: [name]  . Number of children: 2  . Years of education: 22   Social History Main Topics  . Smoking status: Never Smoker  . Smokeless tobacco: Never Used  . Alcohol use No  . Drug use: No   Social History Narrative   Patient is married [name]) and lives with her husband.   Patient has two children.   Patient is retired.   Patient has a college education.   Patient is right handed.   Patient drinks 3-4 cups of coffee and tea daily.   Family History  Problem Relation Age of Onset  . Cancer Mother   . Alzheimer's disease Mother   . Cancer Father   . Heart disease Maternal Grandmother   . Cancer Maternal Grandfather   . Cancer Paternal  Grandmother   . Cancer Paternal Grandfather     Current Outpatient Prescriptions:  .  ALPRAZolam (XANAX) 0.5 MG tablet, Take 0.5 mg by mouth every 12 (twelve) hours as needed. For anxiety, Disp: , Rfl:  .  ascorbic acid (VITAMIN C) 500 MG tablet, Take 500 mg by mouth 2 (two) times daily., Disp: , Rfl:  .  Biotin 1000 MCG tablet, Take 1,000 mcg by mouth 2 (two) times daily as needed.  , Disp: , Rfl:  .  Cholecalciferol (VITAMIN D3) 2000 UNITS capsule, Take 1,000 Units by mouth daily., Disp: , Rfl:  .  Cinnamon 500 MG capsule, Take by mouth., Disp: , Rfl:  .  clindamycin (CLEOCIN) 150 MG capsule, Take 600 mg by mouth., Disp: , Rfl:  .  cyanocobalamin (,VITAMIN B-12,) 1000 MCG/ML injection, Inject 1,000 mcg into the muscle every 30 (thirty) days., Disp: , Rfl:  .  cyclobenzaprine (FLEXERIL) 10 MG tablet, Take 10  mg by mouth 2 (two) times daily., Disp: , Rfl:  .  denosumab (PROLIA) 60 MG/ML SOLN injection, Inject 60 mg into the skin every 6 (six) months. Administer in upper arm, thigh, or abdomen, Disp: , Rfl:  .  ergocalciferol (VITAMIN D2) 50000 units capsule, Take 50,000 Units by mouth once a week., Disp: , Rfl:  .  Flaxseed, Linseed, (FLAXSEED OIL) 1000 MG CAPS, Take 1,000 mg by mouth 4 (four) times daily. , Disp: , Rfl:  .  folic acid (FOLVITE) 1 MG tablet, Take 1 mg by mouth., Disp: , Rfl:  .  HYDROmorphone (DILAUDID) 4 MG tablet, Take 1 tablet by mouth every 4 (four) hours as needed., Disp: , Rfl:  .  lidocaine (LIDODERM) 5 %, Place 1 patch onto the skin daily as needed. FOR PAIN.    Remove & Discard patch within 12 hours or as directed by MD, Disp: , Rfl:  .  lidocaine (XYLOCAINE) 5 % ointment, Apply 1 application topically as needed. FOR PAIN, Disp: , Rfl:  .  metoprolol tartrate (LOPRESSOR) 25 MG tablet, Take 25 mg by mouth 2 (two) times daily., Disp: , Rfl:  .  naloxegol oxalate (MOVANTIK) 25 MG TABS tablet, Take 25 mg by mouth. 1 tablet daily 2 hours after breakfast, then nothing to eat for 1 hour, Disp: , Rfl:  .  omeprazole (PRILOSEC) 10 MG capsule, Take 20 mg by mouth 2 (two) times daily before a meal. , Disp: , Rfl:  .  ondansetron (ZOFRAN) 8 MG tablet, Take 8 mg by mouth every 6 (six) hours as needed., Disp: , Rfl:  .  pseudoephedrine (SUDAFED) 30 MG tablet, Take 30 mg by mouth., Disp: , Rfl:  .  RA KRILL OIL 500 MG CAPS, Take by mouth., Disp: , Rfl:  .  topiramate (TOPAMAX) 25 MG tablet, Take 2 tablets (50 mg total) by mouth 2 (two) times daily., Disp: 120 tablet, Rfl: 3 .  Turmeric (RA TURMERIC) 500 MG CAPS, Take by mouth., Disp: , Rfl:  .  docusate sodium (COLACE) 50 MG capsule, Take 50 mg by mouth 2 (two) times daily., Disp: , Rfl:  .  promethazine (PHENERGAN) 25 MG tablet, Take by mouth., Disp: , Rfl:  .  Tofacitinib Citrate 5 MG TABS, Take 1 tablet by mouth every morning., Disp: ,  Rfl:   No LMP recorded. Patient has had a hysterectomy.  ROS Cardiovascular: Denies chest pain  Respiratory: Denies dyspnea   OBJECTIVE: BP (!) 148/90 (BP Location: Left Arm, Patient Position: Sitting, Cuff Size: Small)  Pulse 75   Temp 98.2 F (36.8 C) (Oral)   Ht 5\' 2"  (1.575 m)   Wt 192 lb 3.2 oz (87.2 kg)   SpO2 98%   BMI 35.15 kg/m   Constitutional: -  VS reviewed -  Well developed, well nourished, appears stated age -  No apparent distress  Psychiatric: -  Oriented to person, place, and time -  Memory intact -  Affect and mood normal -  Fluent conversation, good eye contact -  Judgment and insight age appropriate  Eye: -  Conjunctivae clear, no discharge -  Pupils symmetric, round, reactive to light  ENMT: -  Ears are patent b/l without erythema or discharge. TM's are shiny and clear b/l without evidence of effusion or infection. -  Oral mucosa without lesions, tongue and uvula midline    Tonsils not enlarged, no erythema, no exudate, trachea midline    Pharynx moist, no lesions, no erythema  Neck: -  No gross swelling, no palpable masses -  Thyroid midline, not enlarged, mobile, no palpable masses  Cardiovascular: -  RRR, no murmurs -  No LE edema  Respiratory: -  Normal respiratory effort, no accessory muscle use, no retraction -  Breath sounds equal, no wheezes, no ronchi, no crackles  Gastrointestinal: -  Bowel sounds normal -  No tenderness, no distention, no guarding, no masses  Neurological:  -  CN II - XII grossly intact -  Sensation grossly intact to light touch, equal bilaterally  Musculoskeletal: -  No clubbing, no cyanosis -  Gait normal  Skin: -  No significant lesion on inspection -  Warm and dry to palpation   ASSESSMENT/PLAN: Chronic constipation - Plan: Ambulatory referral to Gastroenterology  Bloating - Plan: Ambulatory referral to Gastroenterology  Rheumatoid arthritis involving both hands, unspecified rheumatoid factor presence ([place]) -  Plan: Ambulatory referral to Rheumatology  Patient instructed to sign release of records form from her [facility]. Recommended she keep a food and symptom diary regarding her bloating. For constipation, recommended using an enema for current compacted stool and MiraLAX 1-2 times daily to help loosen the future stools. If she starts having symptoms of B12 deficiency again, she should stop. She plans to see both [place] and her [place] clinician. I told her I'm okay with this, however she is to choose one provider took care of a particular issue. I recommended that she stay with her current [place] provider for anxiety as I do not prescribe Xanax or other benzodiazepines in conjunction with narcotics. She receives oral hydromorphone from her neurosurgeon. Solu-Medrol today for rheumatoid arthritis flare. This historically has done better than PO. Patient should return in 4 weeks to recheck BP and headaches. The patient voiced understanding and agreement to the plan.   [place], DO 01/23/16  9:54 AM

## 2016-02-02 ENCOUNTER — Telehealth: Payer: Self-pay | Admitting: Internal Medicine

## 2016-02-04 ENCOUNTER — Encounter: Payer: Self-pay | Admitting: Internal Medicine

## 2016-02-11 ENCOUNTER — Ambulatory Visit: Payer: Medicare Other | Admitting: Family Medicine

## 2016-02-11 ENCOUNTER — Telehealth: Payer: Self-pay | Admitting: Family Medicine

## 2016-02-11 NOTE — Telephone Encounter (Signed)
Patient lvm at 11:20am cancelling appointment due to transportation and  [facility] to 02/12/16 at 1:30pm, charge or no charge

## 2016-02-11 NOTE — Telephone Encounter (Signed)
No charge. 

## 2016-02-12 ENCOUNTER — Ambulatory Visit (INDEPENDENT_AMBULATORY_CARE_PROVIDER_SITE_OTHER): Payer: Medicare Other | Admitting: Family Medicine

## 2016-02-12 ENCOUNTER — Encounter: Payer: Self-pay | Admitting: Family Medicine

## 2016-02-12 VITALS — BP 162/84 | HR 80 | Temp 98.0°F | Ht 63.0 in | Wt 193.0 lb

## 2016-02-12 DIAGNOSIS — M791 Myalgia, unspecified site: Secondary | ICD-10-CM

## 2016-02-12 DIAGNOSIS — S46812A Strain of other muscles, fascia and tendons at shoulder and upper arm level, left arm, initial encounter: Secondary | ICD-10-CM | POA: Diagnosis not present

## 2016-02-12 NOTE — Progress Notes (Signed)
Musculoskeletal Exam  Patient: Kristina Patel DOB: 1945/06/02  DOS: 02/12/2016  SUBJECTIVE:  Chief Complaint:   Chief Complaint  Patient presents with  . Neck Pain    Pt states she can not raise her LT arm out and worsens throughout theday and radiates into her LT     Kristina Patel is a 70 y.o.  female for evaluation and treatment of neck pain.   Onset:  5 days ago. Sudden, not related to any activity or injury. She has been more active than usual with house work (recently moved).  Location: L trap Character:  aching  Progression of issue:  has worsened Associated symptoms: Decreased ROM Treatment: to date has been heat and ice. Lidocaine patches Neurovascular symptoms: no new symptoms  ROS: Musculoskeletal/Extremities: +Neck/shoulder pain Neurologic: no numbness, tingling no weakness   Past Medical History:  Diagnosis Date  . Degenerative disc disease   . Fall   . Headache   . Hyperlipidemia   . Hypertension   . Melanoma ([place])   . Osteoarthritis   . Ovarian cancer ([place])   . Renal disease   . Rheumatoid arthritis(714.0)   . Uterine cancer [facility])    Past Surgical History:  Procedure Laterality Date  . ABDOMINAL HYSTERECTOMY     partial  . APPENDECTOMY    . BACK SURGERY    . BREAST SURGERY Bilateral    benign fatty tumor removed  . CARPAL TUNNEL RELEASE Right   . CERVICAL SPINE SURGERY    . CHOLECYSTECTOMY    . MASTECTOMY, PARTIAL    . OPERATIVE HYSTEROSCOPY    . right knee replacement    . TONSILLECTOMY     Family History  Problem Relation Age of Onset  . Cancer Mother     colon/lung  . Alzheimer's disease Mother   . Cancer Father     melanoma  . Hodgkin's lymphoma Father   . Heart disease Maternal Grandmother   . Cancer Maternal Grandfather   . Cancer Paternal Grandmother   . Cancer Paternal Grandfather    Current Outpatient Prescriptions  Medication Sig Dispense Refill  . ALPRAZolam (XANAX) 0.5 MG tablet Take 0.5 mg by mouth every 12  (twelve) hours as needed. For anxiety    . ascorbic acid (VITAMIN C) 500 MG tablet Take 500 mg by mouth 2 (two) times daily.    . Biotin 1000 MCG tablet Take 1,000 mcg by mouth 2 (two) times daily as needed.      . Cholecalciferol (VITAMIN D3) 2000 UNITS capsule Take 1,000 Units by mouth daily.    . Cinnamon 500 MG capsule Take by mouth.    . clindamycin (CLEOCIN) 150 MG capsule Take 600 mg by mouth.    . cyanocobalamin (,VITAMIN B-12,) 1000 MCG/ML injection Inject 1,000 mcg into the muscle every 30 (thirty) days.    . cyclobenzaprine (FLEXERIL) 10 MG tablet Take 10 mg by mouth 2 (two) times daily.    Marland Kitchen denosumab (PROLIA) 60 MG/ML SOLN injection Inject 60 mg into the skin every 6 (six) months. Administer in upper arm, thigh, or abdomen    . docusate sodium (COLACE) 50 MG capsule Take 50 mg by mouth 2 (two) times daily.    . ergocalciferol (VITAMIN D2) 50000 units capsule Take 50,000 Units by mouth once a week.    . Flaxseed, Linseed, (FLAXSEED OIL) 1000 MG CAPS Take 1,000 mg by mouth 4 (four) times daily.     . folic acid (FOLVITE) 1 MG tablet Take 1  mg by mouth.    Marland Kitchen HYDROmorphone (DILAUDID) 4 MG tablet Take 1 tablet by mouth every 4 (four) hours as needed.    . lidocaine (LIDODERM) 5 % Place 1 patch onto the skin daily as needed. FOR PAIN.    Remove & Discard patch within 12 hours or as directed by MD    . lidocaine (XYLOCAINE) 5 % ointment Apply 1 application topically as needed. FOR PAIN    . metoprolol tartrate (LOPRESSOR) 25 MG tablet Take 25 mg by mouth 2 (two) times daily.    . naloxegol oxalate (MOVANTIK) 25 MG TABS tablet Take 25 mg by mouth. 1 tablet daily 2 hours after breakfast, then nothing to eat for 1 hour    . omeprazole (PRILOSEC) 10 MG capsule Take 20 mg by mouth 2 (two) times daily before a meal.     . ondansetron (ZOFRAN) 8 MG tablet Take 8 mg by mouth every 6 (six) hours as needed.    . promethazine (PHENERGAN) 25 MG tablet Take by mouth.    . pseudoephedrine (SUDAFED) 30  MG tablet Take 30 mg by mouth.    . RA KRILL OIL 500 MG CAPS Take by mouth.    . Tofacitinib Citrate 5 MG TABS Take 1 tablet by mouth every morning.    . topiramate (TOPAMAX) 25 MG tablet Take 2 tablets (50 mg total) by mouth 2 (two) times daily. 120 tablet 3  . Turmeric (RA TURMERIC) 500 MG CAPS Take by mouth.     Allergies  Allergen Reactions  . Butalbital-Asa-Caff-Codeine [Fiorinal-Codeine] Anaphylaxis and Swelling  . Darvon Other (See Comments)    Irritable & hyper  . Ambien [Zolpidem Tartrate] Other (See Comments)    Sleep walking  . Ativan [Lorazepam] Other (See Comments)    Adverse reaction...made patient "act out"  . Ciprofloxacin Hcl     nausea  . Fiorinal [Butalbital-Aspirin-Caffeine]   . Penicillins   . Propoxyphene Other (See Comments)    unknown  . Tape Other (See Comments)    Allergic to plastic tape. The adhesive causes redness to skin as well as loss of skin.  . Benzodiazepines Cough   Social History   Social History  . Marital status: Married    Spouse name: [name]  . Number of children: 2  . Years of education: 23   Social History Main Topics  . Smoking status: Never Smoker  . Smokeless tobacco: Never Used  . Alcohol use No  . Drug use: No   Social History Narrative   Patient is married [name]) and lives with her husband.   Patient has two children.   Patient is retired.   Patient has a college education.   Patient is right handed.   Patient drinks 3-4 cups of coffee and tea daily.    Objective: VITAL SIGNS: BP (!) 162/84 (BP Location: Right Arm, Patient Position: Sitting, Cuff Size: Small)   Pulse 80   Temp 98 F (36.7 C) (Oral)   Ht 5\' 3"  (1.6 m)   Wt 193 lb (87.5 kg)   SpO2 98%   BMI 34.19 kg/m  Constitutional: Well formed, well developed. No acute distress. Cardiovascular: Brisk cap refill Thorax & Lungs: No accessory muscle use Extremities: No clubbing. No cyanosis. No edema.  Skin: Warm. Dry. No erythema. No rash.  Musculoskeletal:  Neck, trap.   Normal active range of motion: no- decreased neck rotation (this is chronic though).   Normal passive range of motion: no- decreased neck rotation (chronic) Tenderness  to palpation: yes Deformity: no Ecchymosis: no Tests negative: Spurling's 5/5 strength in UE's b/l Neurologic: No sensation to light touch b/l hands (chronic neuropathy). No focal deficits noted. DTR's equal and symmetry in LE's. No clonus. Psychiatric: Normal mood. Age appropriate judgment and insight. Alert & oriented x 3.    Procedure note; trigger point injection Consent obtained. The areas of interest were demarcated with an otoscope speculum tip in the central, cephalad portion of the trapezius muscle. There were then cleaned with alcohol. 1 mL of 2% lidocaine was injected. The areas were then bandaged. There were no complications noted. The patient tolerated the procedure well. She noted immediate improvement.  Assessment:  Strain of left trapezius muscle, initial encounter  Trigger point - Plan: Inject trigger point, 1 or 2  Plan: Orders as above. Heat. Stretches/exercises given. Do what you can tolerate. Continue with motrin/tylenol. She is on chronic narcotics through pain management, I will not add anything that could violate her contract. Seemed to do well with injection. Hopefully it lasts. F/u prn. The patient voiced understanding and agreement to the plan.   [place], DO 02/12/16  10:54 AM

## 2016-02-12 NOTE — Patient Instructions (Signed)
Stretching and range of motion exercises These exercises warm up your muscles and joints and improve the movement and flexibility of your shoulder. These exercises can also help to relieve pain, numbness, and tingling.  Exercise A: Flexion, standing 1. Stand and hold a broomstick, a cane, or a similar object. Place your hands a little more than shoulder-width apart on the object. Your left / right hand should be palm-up, and your other hand should be palm-down. 2. Push the stick to raise your left / right arm out to your side and then over your head. Use your other hand to help move the stick. Stop when you feel a stretch in your shoulder, or when you reach the angle that is recommended by your health care provider. ? Avoid shrugging your shoulder while you raise your arm. Keep your shoulder blade tucked down toward your spine. 3. Hold for 15-20 seconds. 4. Slowly return to the starting position. Repeat 2-3 times. Complete this exercise 1 times a day.  Exercise B: Abduction, supine 1. Lie on your back and hold a broomstick, a cane, or a similar object. Place your hands a little more than shoulder-width apart on the object. Your left / right hand should be palm-up, and your other hand should be palm-down. 2. Push the stick to raise your left / right arm out to your side and then over your head. Use your other hand to help move the stick. Stop when you feel a stretch in your shoulder, or when you reach the angle that is recommended by your health care provider. ? Avoid shrugging your shoulder while you raise your arm. Keep your shoulder blade tucked down toward your spine. 3. Hold for __________ seconds. 4. Slowly return to the starting position. Repeat __________ times. Complete this exercise __________ times a day.  Exercise C: Flexion, active-assisted 1. Lie on your back. You may bend your knees for comfort. 2. Hold a broomstick, a cane, or a similar object. Place your hands about  shoulder-width apart on the object. Your palms should face toward your feet. 3. Raise the stick and move your arms over your head and behind your head, toward the floor. Use your healthy arm to help your left / right arm move farther. Stop when you feel a gentle stretch in your shoulder, or when you reach the angle where your health care provider tells you to stop. 4. Hold for __________ seconds. 5. Slowly return to the starting position. Repeat __________ times. Complete this exercise __________ times a day.  Exercise D: External rotation and abduction 1. Stand in a door frame with one of your feet slightly in front of the other. This is called a staggered stance. 2. Choose one of the following positions as told by your health care provider: ? Place your hands and forearms on the door frame above your head. ? Place your hands and forearms on the door frame at the height of your head. ? Place your hands on the door frame at the height of your elbows. 3. Slowly move your weight onto your front foot until you feel a stretch across your chest and in the front of your shoulders. Keep your head and chest upright and keep your abdominal muscles tight. 4. Hold for __________ seconds. 5. To release the stretch, shift your weight to your back foot. Repeat __________ times. Complete this stretch __________ times a day. Strengthening exercises These exercises build strength and endurance in your shoulder. Endurance is the ability to use  your muscles for a long time, even after your muscles get tired. Exercise E: Scapular depression and adduction 1. Sit on a stable chair. Support your arms in front of you with pillows, armrests, or a tabletop. Keep your elbows in line with the sides of your body. 2. Gently move your shoulder blades down toward your middle back. Relax the muscles on the tops of your shoulders and in the back of your neck. 3. Hold for __________ seconds. 4. Slowly release the tension and  relax your muscles completely before doing this exercise again. 5. After you have practiced this exercise, try doing the exercise without the arm support. Then, try the exercise while standing instead of sitting. Repeat __________ times. Complete this exercise __________ times a day.  Exercise F: Shoulder abduction, isometric 1. Stand or sit about 4-6 inches (10-15 cm) from a wall with your left / right side facing the wall. 2. Bend your left / right elbow and gently press your elbow against the wall. 3. Increase the pressure slowly until you are pressing as hard as you can without shrugging your shoulder. 4. Hold for __________ seconds. 5. Slowly release the tension and relax your muscles completely. Repeat __________ times. Complete this exercise __________ times a day.  Exercise G: Shoulder flexion, isometric 1. Stand or sit about 4-6 inches (10-15 cm) away from a wall with your left / right side facing the wall. 2. Keep your left / right elbow straight and gently press the top of your fist against the wall. Increase the pressure slowly until you are pressing as hard as you can without shrugging your shoulder. 3. Hold for __________ seconds. 4. Slowly release the tension and relax your muscles completely. Repeat __________ times. Complete this exercise __________ times a day.  Exercise H: Internal rotation 1. Sit in a stable chair without armrests, or stand. Secure an exercise band at your left / right side, at elbow height. 2. Place a soft object, such as a folded towel or a small pillow, under your left / right upper arm so your elbow is a few inches (about 8 cm) away from your side. 3. Hold the end of the exercise band so the band stretches. 4. Keeping your elbow pressed against the soft object under your arm, move your forearm across your body toward your abdomen. Keep your body steady so the movement is only coming from your shoulder. 5. Hold for __________ seconds. 6. Slowly return  to the starting position. Repeat __________ times. Complete this exercise __________ times a day.  Exercise I: External rotation 1. Sit in a stable chair without armrests, or stand. 2. Secure an exercise band at your left / right side, at elbow height. 3. Place a soft object, such as a folded towel or a small pillow, under your left / right upper arm so your elbow is a few inches (about 8 cm) away from your side. 4. Hold the end of the exercise band so the band stretches. 5. Keeping your elbow pressed against the soft object under your arm, move your forearm out, away from your abdomen. Keep your body steady so the movement is only coming from your shoulder. 6. Hold for __________ seconds. 7. Slowly return to the starting position. Repeat __________ times. Complete this exercise __________ times a day. Exercise J: Shoulder extension 1. Sit in a stable chair without armrests, or stand. Secure an exercise band to a stable object in front of you so the band is at shoulder height.  2. Hold one end of the exercise band in each hand. Your palms should face each other. 3. Straighten your elbows and lift your hands up to shoulder height. 4. Step back, away from the secured end of the exercise band, until the band stretches. 5. Squeeze your shoulder blades together and pull your hands down to the sides of your thighs. Stop when your hands are straight down by your sides. Do not let your hands go behind your body. 6. Hold for __________ seconds. 7. Slowly return to the starting position. Repeat __________ times. Complete this exercise __________ times a day.  Exercise K: Shoulder extension, prone 1. Lie on your abdomen on a firm surface so your left / right arm hangs over the edge. 2. Hold a __________ weight in your hand so your palm faces in toward your body. Your arm should be straight. 3. Squeeze your shoulder blade down toward the middle of your back. 4. Slowly raise your arm behind you, up to  the height of the surface that you are lying on. Keep your arm straight. 5. Hold for __________ seconds. 6. Slowly return to the starting position and relax your muscles. Repeat __________ times. Complete this exercise __________ times a day. Exercise L: Horizontal abduction, prone 1. Lie on your abdomen on a firm surface so your left / right arm hangs over the edge. 2. Hold a __________ weight in your hand so your palm faces toward your feet. Your arm should be straight. 3. Squeeze your shoulder blade down toward the middle of your back. 4. Bend your elbow so your hand moves up, until your elbow is bent to an "L" shape (90 degrees). With your elbow bent, slowly move your forearm forward and up. Raise your hand up to the height of the surface that you are lying on. ? Your upper arm should not move, and your elbow should stay bent. ? At the top of the movement, your palm should face the floor. 5. Hold for __________ seconds. 6. Slowly return to the starting position and relax your muscles. Repeat __________ times. Complete this exercise __________ times a day. Exercise M: Horizontal abduction, standing 1. Sit on a stable chair, or stand. 2. Secure an exercise band to a stable object in front of you so the band is at shoulder height. 3. Hold one end of the exercise band in each hand. 4. Straighten your elbows and lift your hands straight in front of you, up to shoulder height. Your palms should face down, toward the floor. 5. Step back, away from the secured end of the exercise band, until the band stretches. 6. Move your arms out to your sides, and keep your arms straight. 7. Hold for __________ seconds. 8. Slowly return to the starting position. Repeat __________ times. Complete this exercise __________ times a day. Exercise N: Scapular retraction and elevation 1. Sit on a stable chair, or stand. 2. Secure an exercise band to a stable object in front of you so the band is at shoulder  height. 3. Hold one end of the exercise band in each hand. Your palms should face each other. 4. Sit in a stable chair without armrests, or stand. 5. Step back, away from the secured end of the exercise band, until the band stretches. 6. Squeeze your shoulder blades together and lift your hands over your head. Keep your elbows straight. 7. Hold for __________ seconds. 8. Slowly return to the starting position. Repeat __________ times. Complete this exercise __________ times  a day. This information is not intended to replace advice given to you by your health care provider. Make sure you discuss any questions you have with your health care provider. Document Released: 02/08/2005 Document Revised: 10/16/2015 Document Reviewed: 12/26/2014 Elsevier Interactive Patient Education  2017 [employer].

## 2016-02-12 NOTE — Progress Notes (Signed)
Pre visit review using our clinic review tool, if applicable. No additional management support is needed unless otherwise documented below in the visit note. 

## 2016-02-13 ENCOUNTER — Telehealth: Payer: Self-pay | Admitting: Family Medicine

## 2016-02-13 NOTE — Telephone Encounter (Signed)
Pt's spouse dropped off Document for PCP to have (medication list), pt wanted to add a med to her list so that Provider can have on file.  Document put at front office tray.

## 2016-03-08 NOTE — Telephone Encounter (Signed)
Pt is scheduled on 03-24-16

## 2016-03-18 ENCOUNTER — Telehealth: Payer: Self-pay | Admitting: Family Medicine

## 2016-03-18 ENCOUNTER — Encounter: Payer: Self-pay | Admitting: Gastroenterology

## 2016-03-18 NOTE — Telephone Encounter (Signed)
No just FYI incase she ever says something face to face you are aware of her concern.

## 2016-03-18 NOTE — Telephone Encounter (Signed)
Is there anything I need to do with this?

## 2016-03-18 NOTE — Telephone Encounter (Signed)
Patient called in stating she got a letter from [facility] saying [facility] was not credentialed with them she has filed an appeal to see if they will pay her bills. Just wanted to let us know. I will check with credentialing to verify.

## 2016-03-19 ENCOUNTER — Encounter: Payer: Self-pay | Admitting: Gastroenterology

## 2016-03-24 ENCOUNTER — Encounter: Payer: Self-pay | Admitting: Internal Medicine

## 2016-03-24 ENCOUNTER — Other Ambulatory Visit (INDEPENDENT_AMBULATORY_CARE_PROVIDER_SITE_OTHER): Payer: Medicare Other

## 2016-03-24 ENCOUNTER — Ambulatory Visit (INDEPENDENT_AMBULATORY_CARE_PROVIDER_SITE_OTHER): Payer: Medicare Other | Admitting: Internal Medicine

## 2016-03-24 ENCOUNTER — Encounter (INDEPENDENT_AMBULATORY_CARE_PROVIDER_SITE_OTHER): Payer: Self-pay

## 2016-03-24 VITALS — BP 152/90 | HR 60 | Ht 62.0 in | Wt 195.0 lb

## 2016-03-24 DIAGNOSIS — K641 Second degree hemorrhoids: Secondary | ICD-10-CM

## 2016-03-24 DIAGNOSIS — F1190 Opioid use, unspecified, uncomplicated: Secondary | ICD-10-CM

## 2016-03-24 DIAGNOSIS — R10819 Abdominal tenderness, unspecified site: Secondary | ICD-10-CM | POA: Diagnosis not present

## 2016-03-24 DIAGNOSIS — R140 Abdominal distension (gaseous): Secondary | ICD-10-CM | POA: Diagnosis not present

## 2016-03-24 DIAGNOSIS — K5909 Other constipation: Secondary | ICD-10-CM

## 2016-03-24 DIAGNOSIS — R1032 Left lower quadrant pain: Secondary | ICD-10-CM

## 2016-03-24 DIAGNOSIS — Z86010 Personal history of colonic polyps: Secondary | ICD-10-CM

## 2016-03-24 DIAGNOSIS — R1031 Right lower quadrant pain: Secondary | ICD-10-CM | POA: Diagnosis not present

## 2016-03-24 DIAGNOSIS — K581 Irritable bowel syndrome with constipation: Secondary | ICD-10-CM

## 2016-03-24 LAB — TSH: TSH: 1.75 u[IU]/mL (ref 0.35–4.50)

## 2016-03-24 NOTE — Patient Instructions (Addendum)
   We are going to obtain your records from [place] for Dr Carlean Purl to review.    Your physician has requested that you go to the basement for the following lab work before leaving today: TSH   You have been scheduled for a CT scan of the abdomen and pelvis at [facility] ([address]---this is in the same building as [facility]).   You are scheduled on 03/29/16 at 9:30AM. You should arrive 15 minutes prior to your appointment time for registration. Please follow the written instructions below on the day of your exam:  WARNING: IF YOU ARE ALLERGIC TO IODINE/X-RAY DYE, PLEASE NOTIFY RADIOLOGY IMMEDIATELY AT [phone]! YOU WILL BE GIVEN A 13 HOUR PREMEDICATION PREP.  1) Do not eat or drink anything after 5:30AM (4 hours prior to your test) 2) You have been given 2 bottles of oral contrast to drink. The solution may taste  better if refrigerated, but do NOT add ice or any other liquid to this solution. Shake  well before drinking.    Drink 1 bottle of contrast @ 7:30AM (2 hours prior to your exam)  Drink 1 bottle of contrast @ 8:30AM (1 hour prior to your exam)  You may take any medications as prescribed with a small amount of water except for the following: Metformin, Glucophage, Glucovance, Avandamet, Riomet, Fortamet, Actoplus Met, Janumet, Glumetza or Metaglip. The above medications must be held the day of the exam AND 48 hours after the exam.  The purpose of you drinking the oral contrast is to aid in the visualization of your intestinal tract. The contrast solution may cause some diarrhea. Before your exam is started, you will be given a small amount of fluid to drink. Depending on your individual set of symptoms, you may also receive an intravenous injection of x-ray contrast/dye. Plan on being at [facility] for 30 minutes or longer, depending on the type of exam you are having performed.  This test typically takes 30-45 minutes to complete.  If  you have any questions regarding your exam or if you need to reschedule, you may call the CT department at [phone] between the hours of 8:00 am and 5:00 pm, Monday-Friday.  ________________________________________________________________________  I appreciate the opportunity to care for you. Silvano Rusk, MD, [facility]

## 2016-03-24 NOTE — Progress Notes (Signed)
Kristina Patel 71 y.o. 1945/11/21 [id number]  Assessment & Plan:   Encounter Diagnoses  Name Primary?  . Lower abdominal tenderness Yes  . Bilateral lower abdominal pain   . Bloating   . Prolapsed internal hemorrhoids, grade 2 w/ bleeding and mucoid discharge   . Hx of adenomatous colonic polyps   . Chronic narcotic use   . Chronic constipation   . Irritable bowel syndrome with constipation    Complicated situation. Seems most likely functional and/or neuropathic - has not responded to Amitiza, Linzess or Movantik or Miralax. She says defecation does not relieve bloating ad pain.  Evaluate with CT/abd/pelvis first Check TSH Review colonoscopy records may be due for surveillance this year May need anorectal manometry - ? Pelvic floor problems  Looks like she has been on duloxetine in past - would consider retrial depending  ? try metaclopramide - had GES at [facility] Normal but at upper end of range 2hrs  Note she has ondansetron on list which could aggravate constipation - polypharmay an issue  She has not tried Abx ? SIBO component, also consider probiotic  I appreciate the opportunity to care for this patient. Cc;Nicholas Nolene Ebbs, DO     Subjective:   Chief Complaint: bloating and abdominal pain  HPI Nice elderly married woan who says she has had 1.5 yrs of abdominal bloating and pain that is very bothersome. Extensive records review of [facility] visits and care show that she has tried Linzess, MiraLax, Movantik, w/o benefirt. [place] doctors told her problem was from constipation - which she has but she says sxs occur whetjer she is moving bowels or not. Very bothersome + concerned because she is gaining weight and she takes pride in her appearance and this is bothering her.She claims to eat healthy and have knowledge of obesity issues as she used to own and manage weight loss centers.Had negative CT abd/pelvis in 2015 - [facility] Has hx colon  polyps/adenomas and chart review suggests she may be due for surveillance 05/2016.  The pain is worse in LUQ eher she has a lipoma. Lasts minutes to hrs and has not found relief.  Has hx of ovarian and uterine cancer as a young woman.  2 sons - no birth trauma with vaginal deliveries  No fecal incontinence. Has been using Sennokot lately and says it works pretty well.  Has difficulty with urinating at imes she relates to the bloating and abdominal swelling. Some peripheral edema  Chronic intermittent rectal bleeding and now a frequent mucous dc - clear from anus.   Has chronic low back pain  GI ROS o/w negative  Allergies  Allergen Reactions  . Butalbital-Asa-Caff-Codeine [Fiorinal-Codeine] Anaphylaxis and Swelling  . Darvon Other (See Comments)    Irritable & hyper  . Ambien [Zolpidem Tartrate] Other (See Comments)    Sleep walking  . Ativan [Lorazepam] Other (See Comments)    Adverse reaction...made patient "act out"  . Ciprofloxacin Hcl     nausea  . Fiorinal [Butalbital-Aspirin-Caffeine]   . Penicillins   . Propoxyphene Other (See Comments)    unknown  . Tape Other (See Comments)    Allergic to plastic tape. The adhesive causes redness to skin as well as loss of skin.  . Benzodiazepines Cough   Current Meds  Medication Sig  . ALPRAZolam (XANAX) 0.5 MG tablet Take 0.5 mg by mouth every 12 (twelve) hours as needed. For anxiety  . ascorbic acid (VITAMIN C) 500 MG tablet  Take 500 mg by mouth 2 (two) times daily.  . Biotin 1000 MCG tablet Take 1,000 mcg by mouth 2 (two) times daily as needed.    . Cholecalciferol (VITAMIN D3) 2000 UNITS capsule Take 1,000 Units by mouth daily.  . Cinnamon 500 MG capsule Take by mouth.  . cyanocobalamin (,VITAMIN B-12,) 1000 MCG/ML injection Inject 1,000 mcg into the muscle every 30 (thirty) days.  . cyclobenzaprine (FLEXERIL) 10 MG tablet Take 10 mg by mouth 2 (two) times daily.  Marland Kitchen denosumab (PROLIA) 60 MG/ML SOLN injection Inject 60 mg into  the skin every 6 (six) months. Administer in upper arm, thigh, or abdomen  . docusate sodium (COLACE) 50 MG capsule Take 50 mg by mouth 2 (two) times daily.  . ergocalciferol (VITAMIN D2) 50000 units capsule Take 50,000 Units by mouth once a week.  . Flaxseed, Linseed, (FLAXSEED OIL) 1000 MG CAPS Take 1,000 mg by mouth 4 (four) times daily.   . folic acid (FOLVITE) 1 MG tablet Take 1 mg by mouth.  Marland Kitchen HYDROmorphone (DILAUDID) 4 MG tablet Take 1 tablet by mouth every 4 (four) hours as needed.  . lidocaine (LIDODERM) 5 % Place 1 patch onto the skin daily as needed. FOR PAIN.    Remove & Discard patch within 12 hours or as directed by MD  . metoprolol tartrate (LOPRESSOR) 25 MG tablet Take 25 mg by mouth 2 (two) times daily.  . naloxegol oxalate (MOVANTIK) 25 MG TABS tablet Take 25 mg by mouth. 1 tablet daily 2 hours after breakfast, then nothing to eat for 1 hour  . omeprazole (PRILOSEC) 10 MG capsule Take 20 mg by mouth 2 (two) times daily before a meal.   . ondansetron (ZOFRAN) 8 MG tablet Take 8 mg by mouth every 6 (six) hours as needed.  . promethazine (PHENERGAN) 25 MG tablet Take by mouth.  . pseudoephedrine (SUDAFED) 30 MG tablet Take 30 mg by mouth.  . RA KRILL OIL 500 MG CAPS Take by mouth.  . Tofacitinib Citrate 5 MG TABS Take 1 tablet by mouth every morning.  . topiramate (TOPAMAX) 25 MG tablet Take 2 tablets (50 mg total) by mouth 2 (two) times daily.  . Turmeric (RA TURMERIC) 500 MG CAPS Take by mouth.   Past Medical History:  Diagnosis Date  . Breast mass   . Carpal tunnel syndrome   . Colon polyps   . Degenerative disc disease   . Depression   . DM (diabetes mellitus) ([place])   . Fall   . GERD (gastroesophageal reflux disease)   . Gouty arthropathy   . Headache   . Hyperlipidemia   . Hypertension   . IBS (irritable bowel syndrome)    constipation  . Iron deficiency anemia   . Melanoma ([place])   . Osteoarthritis   . Osteoporosis   . Ovarian cancer ([place])   . Peripheral  neuropathy ([place])   . Pulmonary nodules   . Renal disease   . Rheumatoid arthritis(714.0)   . Squamous cell carcinoma skin of abdomen   . Uterine cancer ([place])   . Vitamin D deficiency    Past Surgical History:  Procedure Laterality Date  . ABDOMINAL HYSTERECTOMY     partial  . APPENDECTOMY    . BREAST SURGERY Bilateral    benign fatty tumor removed  . CARPAL TUNNEL RELEASE Right   . CERVICAL SPINE SURGERY    . CHOLECYSTECTOMY    . COLONOSCOPY    . KNEE ARTHROPLASTY Right   .  LUMBAR DISC SURGERY    . MASTECTOMY, PARTIAL    . MELANOMA EXCISION    . OPERATIVE HYSTEROSCOPY    . TONSILLECTOMY     Social History   Social History  . Marital status: Married    Spouse name: [name]  . Number of children: 2  . Years of education: 16   Occupational History  . disabled    Social History Main Topics  . Smoking status: Never Smoker  . Smokeless tobacco: Never Used  . Alcohol use No  . Drug use: No  . Sexual activity: Not Asked   Other Topics Concern  . None   Social History Narrative   Patient is married [name]) and lives with her husband.   Patient has two children.   Patient is retired.   Patient has a college education.   Patient is right handed.   Patient drinks 3-4 cups of coffee and tea daily.   [employer]   family history includes Alzheimer's disease in her mother; Colon cancer in her maternal grandfather, paternal grandfather, and paternal grandmother; Colon cancer (age of onset: 69) in her mother; Heart disease in her maternal grandmother; Lung cancer in her mother; Melanoma in her father; Non-Hodgkin's lymphoma in her father.    Review of Systems + joint pains "RA", chronic low back pain, depressed mood, fatigie, headaches, muscle pain, insomnia All other ROS negative  Objective:   Physical Exam @BP  (!) 152/90   Pulse 60   Ht 5\' 2"  (1.575 m)   Wt 195 lb (88.5 kg)   BMI 35.67 kg/m @  General:  Obese Well-developed, well-nourished and in no acute  distress but moves slowly due to back pain Eyes:  anicteric. ENT:   Mouth and posterior pharynx free of lesions. + dentures  Neck:   supple w/o thyromegaly or mass.  Lungs: Clear to auscultation bilaterally. Heart:  S1S2, no rubs, murmurs, gallops. Abdomen:  obese, soft, mild-mod tender bilateral lower quadrants and present with muscle tension also, no hepatosplenomegaly, hernia, or mass and BS+.  Rectal:  Patti [place], [place] present.  Small fleshy anal atags otherwise normal anoderm nontender NL resting tone and squeeze and appropriate desecnt with simulated defecation  Anoscopy: Gr 2 internal hemorrhoids - inflamed - all positions  Lymph:  no cervical or supraclavicular adenopathy. Extremities:   no edema, cyanosis or clubbing Skin   no rash. Neuro:  A&O x 3.  Psych:  appropriate mood and  Affect.   [facility] Reviewed[facility]   [facility] - GI notes, labs, Xrays from 2016-17 PCP notes 2017 CBC, CMET 1/192018 NL except sl high chloride

## 2016-03-24 NOTE — Progress Notes (Signed)
Thyroid is ok

## 2016-03-29 ENCOUNTER — Inpatient Hospital Stay: Admission: RE | Admit: 2016-03-29 | Payer: Medicare Other | Source: Ambulatory Visit

## 2016-03-30 ENCOUNTER — Ambulatory Visit (INDEPENDENT_AMBULATORY_CARE_PROVIDER_SITE_OTHER)
Admission: RE | Admit: 2016-03-30 | Discharge: 2016-03-30 | Disposition: A | Discharge status: Home / Self Care | Payer: Medicare Other | Source: Ambulatory Visit | Attending: Internal Medicine | Admitting: Internal Medicine

## 2016-03-30 DIAGNOSIS — R1032 Left lower quadrant pain: Secondary | ICD-10-CM

## 2016-03-30 DIAGNOSIS — R1031 Right lower quadrant pain: Secondary | ICD-10-CM

## 2016-03-30 DIAGNOSIS — R10819 Abdominal tenderness, unspecified site: Secondary | ICD-10-CM

## 2016-03-30 MED ORDER — IOPAMIDOL (ISOVUE-300) INJECTION 61%
100.0000 mL | Freq: Once | INTRAVENOUS | Status: AC | PRN
  Administered 2016-03-30: 100 mL via INTRAVENOUS

## 2016-04-01 ENCOUNTER — Telehealth: Payer: Self-pay | Admitting: Family Medicine

## 2016-04-01 ENCOUNTER — Encounter (HOSPITAL_COMMUNITY): Payer: Self-pay

## 2016-04-01 NOTE — Progress Notes (Signed)
Tell her CT does not show any sig problems - all findings here unrelated to her sxs.  I recommend she do an ano-rectal manometry (order in Dr. Woodward Ku name but put Dr. Carlean Purl requests in notes)   Dx is chronic constipation and altered bowel habits  I cced her PCP on this note

## 2016-04-01 NOTE — Telephone Encounter (Signed)
Patient called stating that she is getting a bill from [facility] due to Dr. Nani Ravens not being credentialed with them yet. She would like to know if she needs to start paying on this bill, if she needs to switch providers, or what she needs to do. Please advise  Phone: [phone]

## 2016-04-01 NOTE — Telephone Encounter (Signed)
Pt aware previous records have been received and sent to [facility], awaiting appt

## 2016-04-01 NOTE — Telephone Encounter (Signed)
Patient is calling to follow up on her referral to rheumatology. Please advise

## 2016-04-02 ENCOUNTER — Encounter: Payer: Self-pay | Admitting: Internal Medicine

## 2016-04-02 ENCOUNTER — Telehealth: Payer: Self-pay

## 2016-04-02 ENCOUNTER — Ambulatory Visit (INDEPENDENT_AMBULATORY_CARE_PROVIDER_SITE_OTHER): Payer: Medicare Other | Admitting: Family Medicine

## 2016-04-02 ENCOUNTER — Encounter: Payer: Self-pay | Admitting: Family Medicine

## 2016-04-02 ENCOUNTER — Ambulatory Visit: Payer: Self-pay | Admitting: Family Medicine

## 2016-04-02 VITALS — BP 178/90 | HR 77 | Temp 98.4°F | Ht 62.0 in | Wt 194.6 lb

## 2016-04-02 DIAGNOSIS — H04129 Dry eye syndrome of unspecified lacrimal gland: Secondary | ICD-10-CM

## 2016-04-02 DIAGNOSIS — Z860100 Personal history of colon polyps, unspecified: Secondary | ICD-10-CM | POA: Insufficient documentation

## 2016-04-02 DIAGNOSIS — R030 Elevated blood-pressure reading, without diagnosis of hypertension: Secondary | ICD-10-CM | POA: Diagnosis not present

## 2016-04-02 DIAGNOSIS — B001 Herpesviral vesicular dermatitis: Secondary | ICD-10-CM | POA: Diagnosis not present

## 2016-04-02 DIAGNOSIS — M792 Neuralgia and neuritis, unspecified: Secondary | ICD-10-CM | POA: Diagnosis not present

## 2016-04-02 DIAGNOSIS — Z800 Family history of malignant neoplasm of digestive organs: Secondary | ICD-10-CM | POA: Insufficient documentation

## 2016-04-02 MED ORDER — VALACYCLOVIR HCL 500 MG PO TABS: 1000.0000 mg | ORAL_TABLET | Freq: Two times a day (BID) | ORAL | 0 refills | Status: DC

## 2016-04-02 MED ORDER — GABAPENTIN 300 MG PO CAPS: 300.0000 mg | ORAL_CAPSULE | Freq: Every day | ORAL | 1 refills | Status: DC

## 2016-04-02 NOTE — Progress Notes (Signed)
Pre visit review using our clinic review tool, if applicable. No additional management support is needed unless otherwise documented below in the visit note. 

## 2016-04-02 NOTE — Telephone Encounter (Signed)
-----   Message from Gatha Mayer, MD sent at 04/02/2016 12:57 PM EST ----- Regarding: next colonoscopy I reviewed records and my recommendation is repeat colonoscopy 06/2018 - let her know and place recall

## 2016-04-02 NOTE — Progress Notes (Signed)
Chief Complaint  Patient presents with  . Mouth Lesions    along with gums and eye lids sore-started last week    Subjective: Patient is a 71 y.o. female here for mouth sores.  Has had sores in mouth for 1 week and one sore outside of mouth. This has happened before. No drainage or fevers.  Also notes some burning in and around her eyes. This has been going on for around 1 week as well. No vision changes, hx of glaucoma, headaches, redness, drainage. She has been blinking more frequently.   Also noted burning pain in her L index finger. It used to bother her only at night, but now is causing her issues during the day. She does have a hx of RA and is going to bring up issue to her specialist at her appt.   ROS: HENT: As noted in HPI Eyes: No vision changes  Family History  Problem Relation Age of Onset  . Alzheimer's disease Mother   . Colon cancer Mother 73  . Lung cancer Mother   . Melanoma Father   . Non-Hodgkin's lymphoma Father   . Heart disease Maternal Grandmother   . Colon cancer Maternal Grandfather   . Colon cancer Paternal Grandmother   . Colon cancer Paternal Grandfather    Past Medical History:  Diagnosis Date  . Breast mass   . Carpal tunnel syndrome   . Colon polyps   . Degenerative disc disease   . Depression   . DM (diabetes mellitus) ([place])   . Fall   . GERD (gastroesophageal reflux disease)   . Gouty arthropathy   . Headache   . Hyperlipidemia   . Hypertension   . IBS (irritable bowel syndrome)    constipation  . Iron deficiency anemia   . Melanoma ([place])   . Osteoarthritis   . Osteoporosis   . Ovarian cancer ([place])   . Peripheral neuropathy ([place])   . Pulmonary nodules   . Renal disease   . Rheumatoid arthritis(714.0)   . Squamous cell carcinoma skin of abdomen   . Uterine cancer ([place])   . Vitamin D deficiency    Allergies  Allergen Reactions  . Butalbital-Asa-Caff-Codeine [Fiorinal-Codeine] Anaphylaxis and Swelling  . Darvon Other (See  Comments)    Irritable & hyper  . Ambien [Zolpidem Tartrate] Other (See Comments)    Sleep walking  . Ativan [Lorazepam] Other (See Comments)    Adverse reaction...made patient "act out"  . Ciprofloxacin Hcl     nausea  . Fiorinal [Butalbital-Aspirin-Caffeine]   . Penicillins   . Propoxyphene Other (See Comments)    unknown  . Tape Other (See Comments)    Allergic to plastic tape. The adhesive causes redness to skin as well as loss of skin.  . Benzodiazepines Cough    Current Outpatient Prescriptions:  .  ALPRAZolam (XANAX) 0.5 MG tablet, Take 0.5 mg by mouth every 12 (twelve) hours as needed. For anxiety, Disp: , Rfl:  .  ascorbic acid (VITAMIN C) 500 MG tablet, Take 500 mg by mouth 2 (two) times daily., Disp: , Rfl:  .  Biotin 1000 MCG tablet, Take 1,000 mcg by mouth 2 (two) times daily as needed.  , Disp: , Rfl:  .  Cholecalciferol (VITAMIN D3) 2000 UNITS capsule, Take 1,000 Units by mouth daily., Disp: , Rfl:  .  Cinnamon 500 MG capsule, Take by mouth., Disp: , Rfl:  .  cyanocobalamin (,VITAMIN B-12,) 1000 MCG/ML injection, Inject 1,000 mcg into the muscle every  30 (thirty) days., Disp: , Rfl:  .  cyclobenzaprine (FLEXERIL) 10 MG tablet, Take 10 mg by mouth 2 (two) times daily., Disp: , Rfl:  .  denosumab (PROLIA) 60 MG/ML SOLN injection, Inject 60 mg into the skin every 6 (six) months. Administer in upper arm, thigh, or abdomen, Disp: , Rfl:  .  docusate sodium (COLACE) 50 MG capsule, Take 50 mg by mouth 2 (two) times daily., Disp: , Rfl:  .  ergocalciferol (VITAMIN D2) 50000 units capsule, Take 50,000 Units by mouth once a week., Disp: , Rfl:  .  Flaxseed, Linseed, (FLAXSEED OIL) 1000 MG CAPS, Take 1,000 mg by mouth 4 (four) times daily. , Disp: , Rfl:  .  folic acid (FOLVITE) 1 MG tablet, Take 1 mg by mouth., Disp: , Rfl:  .  gabapentin (NEURONTIN) 300 MG capsule, Take 1 capsule (300 mg total) by mouth at bedtime. Take 1 cap nightly for 1 week, then 2 caps for a week. Then 3 caps  nightly., Disp: 60 capsule, Rfl: 1 .  HYDROmorphone (DILAUDID) 4 MG tablet, Take 1 tablet by mouth every 4 (four) hours as needed., Disp: , Rfl:  .  lidocaine (LIDODERM) 5 %, Place 1 patch onto the skin daily as needed. FOR PAIN.    Remove & Discard patch within 12 hours or as directed by MD, Disp: , Rfl:  .  metoprolol tartrate (LOPRESSOR) 25 MG tablet, Take 25 mg by mouth 2 (two) times daily., Disp: , Rfl:  .  naloxegol oxalate (MOVANTIK) 25 MG TABS tablet, Take 25 mg by mouth. 1 tablet daily 2 hours after breakfast, then nothing to eat for 1 hour, Disp: , Rfl:  .  omeprazole (PRILOSEC) 10 MG capsule, Take 20 mg by mouth 2 (two) times daily before a meal. , Disp: , Rfl:  .  ondansetron (ZOFRAN) 8 MG tablet, Take 8 mg by mouth every 6 (six) hours as needed., Disp: , Rfl:  .  promethazine (PHENERGAN) 25 MG tablet, Take by mouth., Disp: , Rfl:  .  pseudoephedrine (SUDAFED) 30 MG tablet, Take 30 mg by mouth., Disp: , Rfl:  .  RA KRILL OIL 500 MG CAPS, Take by mouth., Disp: , Rfl:  .  Tofacitinib Citrate 5 MG TABS, Take 1 tablet by mouth every morning., Disp: , Rfl:  .  topiramate (TOPAMAX) 25 MG tablet, Take 2 tablets (50 mg total) by mouth 2 (two) times daily., Disp: 120 tablet, Rfl: 3 .  Turmeric (RA TURMERIC) 500 MG CAPS, Take by mouth., Disp: , Rfl:  .  valACYclovir (VALTREX) 500 MG tablet, Take 2 tablets (1,000 mg total) by mouth 2 (two) times daily., Disp: 4 tablet, Rfl: 0  Objective: BP (!) 178/90 (BP Location: Left Arm, Patient Position: Sitting, Cuff Size: Normal)   Pulse 77   Temp 98.4 F (36.9 C) (Oral)   Ht 5\' 2"  (1.575 m)   Wt 194 lb 9.6 oz (88.3 kg)   SpO2 98%   BMI 35.59 kg/m  General: Awake, appears stated age HENT: MMM, some lesions appreciated on mandibular gum line (pt is edentulous), no buccal lesions, no glossal lesions Eyes: EOMi, PERRLA, no scleral injection, no TTP to light palpation over globes with closed lids Skin: cold sore noted on L angle of mouth, no TTP,  erythema or excoriation noted periocularly Heart: RRR, no murmurs Lungs: CTAB, no rales, wheezes or rhonchi. No accessory muscle use Psych: Age appropriate judgment and insight, normal affect and mood  Assessment and Plan: Cold sore -  Plan: valACYclovir (VALTREX) 500 MG tablet  Elevated blood pressure reading  Dry eye  Neuropathic pain - Plan: gabapentin (NEURONTIN) 300 MG capsule  Orders as above. Valtex may not be as effective this far out. Orajel for lesions in mouth.  Artificial tears for eyes. Start Neurontin until she can get in with Rheum. F/u in 2 weeks for BP check with nurse. Check home BP's. The patient voiced understanding and agreement to the plan.  [place], DO 04/02/16  5:18 PM

## 2016-04-02 NOTE — Patient Instructions (Addendum)
Around 3 times per week, check your blood pressure 4 times per day. Twice in the morning and twice in the evening. The readings should be at least one minute apart. Write down these values and bring them to your next nurse visit/appointment.  When you check your BP, make sure you have been doing something calm/relaxing 5 minutes prior to checking. Both feet should be flat on the floor and you should be sitting. Use your left arm and make sure it is in a relaxed position (on a table), and that the cuff is at the approximate level/height of your heart.  Artificial tears for your eyes. Typically people use it 4 times per day, but you can use it as much as you want.  Orajel for your mouth.

## 2016-04-02 NOTE — Telephone Encounter (Signed)
I have left her a message to call me back. Colon recall placed into system for 06/2018.

## 2016-04-08 NOTE — Telephone Encounter (Signed)
Left another message to call me back.

## 2016-04-12 NOTE — Telephone Encounter (Signed)
Patient informed of recall date.

## 2016-04-14 ENCOUNTER — Ambulatory Visit (INDEPENDENT_AMBULATORY_CARE_PROVIDER_SITE_OTHER): Payer: 59 | Admitting: Psychiatry

## 2016-04-14 ENCOUNTER — Encounter (HOSPITAL_COMMUNITY): Payer: Self-pay | Admitting: Psychiatry

## 2016-04-14 VITALS — BP 132/70 | HR 73 | Ht 63.0 in | Wt 194.0 lb

## 2016-04-14 DIAGNOSIS — F320 Major depressive disorder, single episode, mild: Secondary | ICD-10-CM

## 2016-04-14 DIAGNOSIS — G3184 Mild cognitive impairment, so stated: Secondary | ICD-10-CM | POA: Diagnosis not present

## 2016-04-14 DIAGNOSIS — F3289 Other specified depressive episodes: Secondary | ICD-10-CM | POA: Diagnosis not present

## 2016-04-14 DIAGNOSIS — Z818 Family history of other mental and behavioral disorders: Secondary | ICD-10-CM

## 2016-04-14 DIAGNOSIS — Z79899 Other long term (current) drug therapy: Secondary | ICD-10-CM | POA: Diagnosis not present

## 2016-04-14 MED ORDER — BUPROPION HCL ER (SR) 100 MG PO TB12: 100.0000 mg | ORAL_TABLET | Freq: Every day | ORAL | 1 refills | Status: DC
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
## 2016-04-29 ENCOUNTER — Encounter: Payer: Self-pay | Admitting: Family Medicine

## 2016-04-29 ENCOUNTER — Ambulatory Visit (INDEPENDENT_AMBULATORY_CARE_PROVIDER_SITE_OTHER): Payer: Medicare Other | Admitting: Family Medicine

## 2016-04-29 VITALS — BP 150/90 | HR 69 | Temp 97.7°F | Ht 62.0 in | Wt 190.4 lb

## 2016-04-29 DIAGNOSIS — R1030 Lower abdominal pain, unspecified: Secondary | ICD-10-CM | POA: Diagnosis not present

## 2016-04-29 DIAGNOSIS — R195 Other fecal abnormalities: Secondary | ICD-10-CM

## 2016-04-29 DIAGNOSIS — I10 Essential (primary) hypertension: Secondary | ICD-10-CM

## 2016-04-29 MED ORDER — METOPROLOL TARTRATE 50 MG PO TABS: 50.0000 mg | ORAL_TABLET | Freq: Two times a day (BID) | ORAL | 2 refills | Status: DC

## 2016-04-29 NOTE — Progress Notes (Signed)
Chief Complaint  Patient presents with  . Abdominal Pain    lower,also had rectal bleeding    Kristina Patel is here for lower abdominal pain.  Duration: 3 days Nighttime awakenings? No Bleeding? Yes- 3 days of bleeding that is improving, does have a hx of hemorrhoids Weight loss? Yes Is seen by GI and has ano-rectal manometry tomorrow. She had a largely unremarkable CT abd/pelvis around 1 mo ago She is having mucus in her stool and sometimes by itself. Denies: fever, nausea and vomiting Treatment to date: None  Hypertension Patient presents for hypertension follow up. She does monitor home blood pressures. Blood pressures ranging on average from 130-150's/80-90's. She is compliant with medications- Metoprolol tart 25 mg BID. Patient has these side effects of medication: none She is sometimes adhering to a healthy diet overall. Exercise: little due to chronic pain   ROS: Constitutional: No fevers GI: No N/V/D, +lower abd pain pain  Past Medical History:  Diagnosis Date  . Breast mass   . Carpal tunnel syndrome   . Colon polyps   . Degenerative disc disease   . Depression   . DM (diabetes mellitus) ([place])   . Fall   . GERD (gastroesophageal reflux disease)   . Gouty arthropathy   . Headache   . Hyperlipidemia   . Hypertension   . IBS (irritable bowel syndrome)    constipation  . Iron deficiency anemia   . Melanoma ([place])   . Osteoarthritis   . Osteoporosis   . Ovarian cancer ([place])   . Peripheral neuropathy ([place])   . Pulmonary nodules   . Renal disease   . Rheumatoid arthritis(714.0)   . Squamous cell carcinoma skin of abdomen   . Uterine cancer ([place])   . Vitamin D deficiency    Family History  Problem Relation Age of Onset  . Alzheimer's disease Mother   . Colon cancer Mother 16  . Lung cancer Mother   . Melanoma Father   . Non-Hodgkin's lymphoma Father   . Heart disease Maternal Grandmother   . Colon cancer Maternal Grandfather   . Colon cancer  Paternal Grandmother   . Colon cancer Paternal Grandfather    Past Surgical History:  Procedure Laterality Date  . ABDOMINAL HYSTERECTOMY     partial  . APPENDECTOMY    . BREAST SURGERY Bilateral    benign fatty tumor removed  . CARPAL TUNNEL RELEASE Right   . CERVICAL SPINE SURGERY    . CHOLECYSTECTOMY    . COLONOSCOPY    . KNEE ARTHROPLASTY Right   . LUMBAR DISC SURGERY    . MASTECTOMY, PARTIAL    . MELANOMA EXCISION    . OPERATIVE HYSTEROSCOPY    . TONSILLECTOMY     BP (!) 150/90 (BP Location: Left Arm, Patient Position: Sitting, Cuff Size: Normal)   Pulse 69   Temp 97.7 F (36.5 C) (Oral)   Ht 5\' 2"  (1.575 m)   Wt 190 lb 6.4 oz (86.4 kg)   SpO2 96%   BMI 34.82 kg/m  Gen.: Awake, alert, appears stated age 71: Mucous membranes moist without mucosal lesions Heart: Regular rate and rhythm without murmurs, no bruits, no LE edema Lungs: Clear auscultation bilaterally, no rales or wheezing, normal effort without accessory muscle use. Abdomen: Bowel sounds are present. Abdomen is soft, TTP in lower quadrants, nondistended, no masses or organomegaly. Negative Murphy's, Rovsing's, McBurney's, and Carnett's sign. Psych: Age appropriate judgment and insight. Normal mood and affect.  Lower abdominal pain  Mucus in stool - Plan: Stool Culture  Essential hypertension - Plan: metoprolol (LOPRESSOR) 50 MG tablet  Orders as above.  Increase dose of metoprolol. Continue to monitor home BP. She follows with GI, will bring up these issues at her next appt. Offered to examine anal region, but she declined stating this was recently done and she will f/u with GI. Seeing results of CT reassuring. Will obtain culture for precaution. F/u in 4 weeks to recheck BP. Pt voiced understanding and agreement to the plan.  Benson, DO 04/29/16 2:26 PM

## 2016-04-29 NOTE — Progress Notes (Signed)
Pre visit review using our clinic review tool, if applicable. No additional management support is needed unless otherwise documented below in the visit note. 

## 2016-04-29 NOTE — Patient Instructions (Signed)
Take 2 tabs of metoprolol twice daily until you run out. A new strength has been called to your pharmacy. Continue to monitor and write down your home blood pressure readings.

## 2016-04-30 ENCOUNTER — Ambulatory Visit (HOSPITAL_COMMUNITY)
Admission: RE | Admit: 2016-04-30 | Discharge: 2016-04-30 | Disposition: A | Discharge status: Home / Self Care | Payer: Medicare Other | Source: Ambulatory Visit | Attending: Gastroenterology | Admitting: Gastroenterology

## 2016-04-30 ENCOUNTER — Encounter (HOSPITAL_COMMUNITY)
Admission: RE | Disposition: A | Discharge status: Home / Self Care | Payer: Self-pay | Source: Ambulatory Visit | Attending: Gastroenterology

## 2016-04-30 DIAGNOSIS — M069 Rheumatoid arthritis, unspecified: Secondary | ICD-10-CM | POA: Insufficient documentation

## 2016-04-30 DIAGNOSIS — M810 Age-related osteoporosis without current pathological fracture: Secondary | ICD-10-CM | POA: Diagnosis not present

## 2016-04-30 DIAGNOSIS — Z8542 Personal history of malignant neoplasm of other parts of uterus: Secondary | ICD-10-CM | POA: Insufficient documentation

## 2016-04-30 DIAGNOSIS — R1030 Lower abdominal pain, unspecified: Secondary | ICD-10-CM | POA: Diagnosis present

## 2016-04-30 DIAGNOSIS — R51 Headache: Secondary | ICD-10-CM | POA: Diagnosis not present

## 2016-04-30 DIAGNOSIS — Z79899 Other long term (current) drug therapy: Secondary | ICD-10-CM | POA: Diagnosis not present

## 2016-04-30 DIAGNOSIS — Z7983 Long term (current) use of bisphosphonates: Secondary | ICD-10-CM | POA: Insufficient documentation

## 2016-04-30 DIAGNOSIS — Z8543 Personal history of malignant neoplasm of ovary: Secondary | ICD-10-CM | POA: Insufficient documentation

## 2016-04-30 DIAGNOSIS — R159 Full incontinence of feces: Secondary | ICD-10-CM | POA: Diagnosis not present

## 2016-04-30 DIAGNOSIS — I10 Essential (primary) hypertension: Secondary | ICD-10-CM | POA: Insufficient documentation

## 2016-04-30 DIAGNOSIS — E785 Hyperlipidemia, unspecified: Secondary | ICD-10-CM | POA: Insufficient documentation

## 2016-04-30 DIAGNOSIS — Z85820 Personal history of malignant melanoma of skin: Secondary | ICD-10-CM | POA: Insufficient documentation

## 2016-04-30 DIAGNOSIS — F329 Major depressive disorder, single episode, unspecified: Secondary | ICD-10-CM | POA: Diagnosis not present

## 2016-04-30 DIAGNOSIS — E1142 Type 2 diabetes mellitus with diabetic polyneuropathy: Secondary | ICD-10-CM | POA: Insufficient documentation

## 2016-04-30 HISTORY — PX: ANAL RECTAL MANOMETRY: SHX6358

## 2016-04-30 SURGERY — MANOMETRY, ANORECTAL

## 2016-04-30 NOTE — Progress Notes (Signed)
Anal Rectal Manometry done per protocol. Pt tolerated well without complication or difficulty. Report to be sent to Dr. Silverio Decamp

## 2016-05-02 ENCOUNTER — Encounter (HOSPITAL_COMMUNITY): Payer: Self-pay | Admitting: Gastroenterology

## 2016-05-05 ENCOUNTER — Ambulatory Visit (HOSPITAL_COMMUNITY): Payer: Self-pay | Admitting: Psychiatry

## 2016-05-05 ENCOUNTER — Encounter: Payer: Self-pay | Admitting: Internal Medicine

## 2016-05-05 DIAGNOSIS — R159 Full incontinence of feces: Secondary | ICD-10-CM

## 2016-05-05 DIAGNOSIS — M6289 Other specified disorders of muscle: Secondary | ICD-10-CM

## 2016-05-05 HISTORY — DX: Other specified disorders of muscle: M62.89

## 2016-05-05 LAB — IFOBT (OCCULT BLOOD): IMMUNOLOGICAL FECAL OCCULT BLOOD TEST: POSITIVE

## 2016-05-05 NOTE — Progress Notes (Signed)
Anus, rectum and pelvic floor weakness - needs pelvic PT referral please Also schedule a f/u appt to discuss possible hemorrhoid banding if she wants  Let me know if she has ?

## 2016-05-05 NOTE — Op Note (Signed)
Anorectal Manometry  Interpretation / Findings  Low anal sphincter resting and squeeze pressures  Incomplete relaxation of the anal sphincter with elevated intrarectal pressure during attempted defecation  Rectoanal inhibitory reflex present  Abnormal rectal sensation: low volume for urge to defecate and discomfort Balloon expulsion test was not performed  Impression and Recommendations Weak internal and external anal sphincters Evidence of dyssynergia defecation  Consider referral to PT for biofeedback  K.Denzil Magnuson, MD [facility]

## 2016-05-06 ENCOUNTER — Telehealth: Payer: Self-pay

## 2016-05-06 DIAGNOSIS — R198 Other specified symptoms and signs involving the digestive system and abdomen: Secondary | ICD-10-CM

## 2016-05-06 DIAGNOSIS — M6289 Other specified disorders of muscle: Secondary | ICD-10-CM

## 2016-05-06 NOTE — Addendum Note (Signed)
Addended by: Peggyann Shoals on: 05/06/2016 09:39 AM   Modules accepted: Orders

## 2016-05-06 NOTE — Telephone Encounter (Signed)
Patient notified of the results and recommendations.  She agrees to proceed.  She is also interested in hem banding.  She is scheduled for 05/20/16 3:15

## 2016-05-06 NOTE — Telephone Encounter (Signed)
-----   Message from Gatha Mayer, MD sent at 05/05/2016  7:14 PM EDT ----- Regarding: appts Needs pelvic floor PT due to weakness there and in anus  Also if she wants I can band her hemorrhoids  May be a duplicate message for you - FYI

## 2016-05-10 LAB — STOOL CULTURE

## 2016-05-18 NOTE — Telephone Encounter (Signed)
Talked to patient advised her to appeal the visits as her insurance did cover visit for 01/23/16 after her appeal. They found while provider was

## 2016-05-20 ENCOUNTER — Encounter: Payer: Self-pay | Admitting: Internal Medicine

## 2016-05-31 ENCOUNTER — Encounter: Payer: Self-pay | Admitting: Family Medicine

## 2016-05-31 ENCOUNTER — Ambulatory Visit (INDEPENDENT_AMBULATORY_CARE_PROVIDER_SITE_OTHER): Payer: Medicare Other | Admitting: Family Medicine

## 2016-05-31 VITALS — BP 148/80 | HR 73 | Temp 98.3°F | Ht 62.0 in | Wt 195.4 lb

## 2016-05-31 DIAGNOSIS — I10 Essential (primary) hypertension: Secondary | ICD-10-CM

## 2016-05-31 DIAGNOSIS — S91109A Unspecified open wound of unspecified toe(s) without damage to nail, initial encounter: Secondary | ICD-10-CM

## 2016-05-31 NOTE — Progress Notes (Signed)
Pre visit review using our clinic review tool, if applicable. No additional management support is needed unless otherwise documented below in the visit note. 

## 2016-05-31 NOTE — Patient Instructions (Addendum)
If you do not hear anything about your referral in the next 1-2 weeks, call our office and ask for an update.  Continue soaking foot.   If you start having fevers, pain, drainage, spreading redness, let us know or seek care.  OK to check BP every week.

## 2016-05-31 NOTE — Progress Notes (Signed)
Chief Complaint  Patient presents with  . Follow-up    1 mos on BP    Subjective Kristina Patel is a 71 y.o. female who presents for hypertension follow up. She does monitor home blood pressures. Blood pressures ranging from 120-130's/70-80's on average. She is compliant with medications- Metoprolol 50 mg BID, increased from 25 mg BID from 3/8. Patient has these side effects of medication: none She is adhering to a healthy diet overall. Current exercise: limited due to chronic pain and RA  Pt also has been experiencing 3 weeks of an ulcer on her 4th toe on R. 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 She went to the ED through the [place] and has f/u with a podiatrist in mid-late May. She was also placed on Clindamycin. She would like to know if there is anything else she can do. No fevers or redness.   Past Medical History:  Diagnosis Date  . Bacterial overgrowth syndrome   . Breast mass   . Carpal tunnel syndrome   . Colon polyps   . Degenerative disc disease   . Depression   . Fall   . GERD (gastroesophageal reflux disease)   . Gouty arthropathy   . Headache   . Hyperlipidemia   . Hypertension   . IBS (irritable bowel syndrome)    constipation  . Internal prolapsed hemorrhoids   . Iron deficiency anemia   . Melanoma ([place])   . Osteoarthritis   . Osteoporosis   . Ovarian cancer ([place])   . Pelvic floor dysfunction 05/05/2016  . Peripheral neuropathy ([place])   . Prediabetes   . Pulmonary nodules   . Renal disease   . Rheumatoid arthritis(714.0)   . Squamous cell carcinoma skin of abdomen   . Uterine cancer ([place])   . Vitamin D deficiency    Family History  Problem Relation Age of Onset  . Alzheimer's disease Mother   . Colon cancer Mother 66  . Lung cancer Mother   . Melanoma Father   . Non-Hodgkin's lymphoma Father   . Heart disease Maternal Grandmother   . Colon cancer Maternal Grandfather   . Colon cancer Paternal Grandmother   . Colon cancer  Paternal Grandfather      Medications Current Outpatient Prescriptions on File Prior to Visit  Medication Sig Dispense Refill  . ALPRAZolam (XANAX) 0.5 MG tablet Take 0.5 mg by mouth every 12 (twelve) hours as needed. For anxiety    . ALPRAZolam (XANAX) 1 MG tablet     . ascorbic acid (VITAMIN C) 500 MG tablet Take 500 mg by mouth 2 (two) times daily.    . Biotin 1000 MCG tablet Take 1,000 mcg by mouth 2 (two) times daily as needed.      Marland Kitchen buPROPion (WELLBUTRIN SR) 100 MG 12 hr tablet Take 1 tablet (100 mg total) by mouth daily. Take in the morning 30 tablet 1  . Cholecalciferol (VITAMIN D3) 2000 UNITS capsule Take 1,000 Units by mouth daily.    . Cinnamon 500 MG capsule Take by mouth.    . cyanocobalamin (,VITAMIN B-12,) 1000 MCG/ML injection Inject 1,000 mcg into the muscle every 30 (thirty) days.    . cyclobenzaprine (FLEXERIL) 10 MG tablet Take 10 mg by mouth 2 (two) times daily.    Marland Kitchen denosumab (PROLIA) 60 MG/ML SOLN injection Inject 60 mg into the skin every 6 (six) months. Administer in upper arm, thigh, or abdomen    . docusate sodium (COLACE)  50 MG capsule Take 50 mg by mouth 2 (two) times daily.    . ergocalciferol (VITAMIN D2) 50000 units capsule Take 50,000 Units by mouth once a week.    . Flaxseed, Linseed, (FLAXSEED OIL) 1000 MG CAPS Take 1,000 mg by mouth 4 (four) times daily.     . folic acid (FOLVITE) 1 MG tablet Take 1 mg by mouth.    . gabapentin (NEURONTIN) 300 MG capsule Take 1 capsule (300 mg total) by mouth at bedtime. Take 1 cap nightly for 1 week, then 2 caps for a week. Then 3 caps nightly. 60 capsule 1  . HYDROmorphone (DILAUDID) 4 MG tablet Take 1 tablet by mouth every 4 (four) hours as needed.    . lidocaine (LIDODERM) 5 % Place 1 patch onto the skin daily as needed. FOR PAIN.    Remove & Discard patch within 12 hours or as directed by MD    . metoprolol (LOPRESSOR) 50 MG tablet Take 1 tablet (50 mg total) by mouth 2 (two) times daily. 60 tablet 2  . naloxegol  oxalate (MOVANTIK) 25 MG TABS tablet Take 25 mg by mouth. 1 tablet daily 2 hours after breakfast, then nothing to eat for 1 hour    . omeprazole (PRILOSEC) 10 MG capsule Take 20 mg by mouth 2 (two) times daily before a meal.     . ondansetron (ZOFRAN) 8 MG tablet Take 8 mg by mouth every 6 (six) hours as needed.    . promethazine (PHENERGAN) 25 MG tablet Take by mouth.    . pseudoephedrine (SUDAFED) 30 MG tablet Take 30 mg by mouth.    . RA KRILL OIL 500 MG CAPS Take by mouth.    . Tofacitinib Citrate 5 MG TABS Take 1 tablet by mouth every morning.    . topiramate (TOPAMAX) 25 MG tablet Take 2 tablets (50 mg total) by mouth 2 (two) times daily. 120 tablet 3  . Turmeric (RA TURMERIC) 500 MG CAPS Take by mouth.    . valACYclovir (VALTREX) 500 MG tablet Take 2 tablets (1,000 mg total) by mouth 2 (two) times daily. 4 tablet 0   Allergies Allergies  Allergen Reactions  . Butalbital-Asa-Caff-Codeine [Fiorinal-Codeine] Anaphylaxis and Swelling  . Darvon Other (See Comments)    Irritable & hyper  . Ambien [Zolpidem Tartrate] Other (See Comments)    Sleep walking  . Ativan [Lorazepam] Other (See Comments)    Adverse reaction...made patient "act out"  . Ciprofloxacin Hcl     nausea  . Fiorinal [Butalbital-Aspirin-Caffeine]   . Penicillins   . Propoxyphene Other (See Comments)    unknown  . Tape Other (See Comments)    Allergic to plastic tape. The adhesive causes redness to skin as well as loss of skin.  . Benzodiazepines Cough    Review of Systems Cardiovascular: no chest pain Respiratory:  no shortness of breath  Exam BP (!) 148/80 (BP Location: Left Arm, Patient Position: Sitting, Cuff Size: Normal)   Pulse 73   Temp 98.3 F (36.8 C) (Oral)   Ht 5\' 2"  (1.575 m)   Wt 195 lb 6.4 oz (88.6 kg)   SpO2 98%   BMI 35.74 kg/m  General:  well developed, well nourished, in no apparent distress Skin:  warm, no pallor or diaphoresis Eyes:  pupils equal and round, sclera anicteric without  injection Heart : RRR, no murmurs, no bruits, no LE edema Lungs:  clear to auscultation, no accessory muscle use Skin: elliptically shaped 0.6 cm x 0.5  cm ulcer on distal portion of R 4th digit, granulation tissue present, no drainage, odor, streaking redness Psych: well oriented with normal range of affect and appropriate judgment/insight  Essential hypertension  Open wound of toe, initial encounter - Plan: Ambulatory referral to Wound Clinic  Orders as above. HTN controlled based off of today and home readings. Cont Metoprolol 50 mg BID. As 3 weeks have elapsed, will refer to wound care.  Counseled on diet and exercise F/u in 3 mo. The patient voiced understanding and agreement to the plan.  [place], DO 05/31/16  4:37 PM

## 2016-06-01 ENCOUNTER — Encounter (HOSPITAL_COMMUNITY): Payer: Self-pay | Admitting: Psychiatry

## 2016-06-01 ENCOUNTER — Ambulatory Visit (INDEPENDENT_AMBULATORY_CARE_PROVIDER_SITE_OTHER): Payer: 59 | Admitting: Psychiatry

## 2016-06-01 ENCOUNTER — Encounter (HOSPITAL_COMMUNITY): Payer: Self-pay

## 2016-06-01 VITALS — BP 126/70 | HR 78 | Ht 63.0 in | Wt 195.0 lb

## 2016-06-01 DIAGNOSIS — Z5321 Procedure and treatment not carried out due to patient leaving prior to being seen by health care provider: Secondary | ICD-10-CM

## 2016-06-02 ENCOUNTER — Telehealth (HOSPITAL_COMMUNITY): Payer: Self-pay | Admitting: Psychiatry

## 2016-06-02 NOTE — Progress Notes (Signed)
Pt left without being seen. Attempted to call, but no answer.

## 2016-06-02 NOTE — Telephone Encounter (Signed)
Patient left yesterday without being seen.  This was going to be our follow-up visit after the initial intake. This writer was running about 20 minutes late in clinic, and the patient left without notifying office staff. I attempted to call the patient yesterday and there was no response. I called the patient this morning to apologize for the delay, and see how we can help get her scheduled in for a follow-up. Left a voicemail, as there was no answer on the phone.

## 2016-06-03 ENCOUNTER — Telehealth: Payer: Self-pay | Admitting: *Deleted

## 2016-06-03 DIAGNOSIS — L97519 Non-pressure chronic ulcer of other part of right foot with unspecified severity: Secondary | ICD-10-CM

## 2016-06-03 NOTE — Telephone Encounter (Signed)
Referral ordered and sent.//AB/CMA

## 2016-06-08 ENCOUNTER — Encounter: Payer: Self-pay | Admitting: Podiatry

## 2016-06-08 ENCOUNTER — Encounter: Payer: Self-pay | Admitting: Internal Medicine

## 2016-06-08 ENCOUNTER — Ambulatory Visit (HOSPITAL_BASED_OUTPATIENT_CLINIC_OR_DEPARTMENT_OTHER)
Admission: RE | Admit: 2016-06-08 | Discharge: 2016-06-08 | Disposition: A | Discharge status: Home / Self Care | Payer: Medicare Other | Source: Ambulatory Visit | Attending: Podiatry | Admitting: Podiatry

## 2016-06-08 ENCOUNTER — Ambulatory Visit (INDEPENDENT_AMBULATORY_CARE_PROVIDER_SITE_OTHER): Payer: Medicare Other | Admitting: Podiatry

## 2016-06-08 ENCOUNTER — Telehealth: Payer: Self-pay | Admitting: *Deleted

## 2016-06-08 DIAGNOSIS — M109 Gout, unspecified: Secondary | ICD-10-CM | POA: Diagnosis not present

## 2016-06-08 DIAGNOSIS — M2011 Hallux valgus (acquired), right foot: Secondary | ICD-10-CM | POA: Insufficient documentation

## 2016-06-08 DIAGNOSIS — M2041 Other hammer toe(s) (acquired), right foot: Secondary | ICD-10-CM | POA: Insufficient documentation

## 2016-06-08 DIAGNOSIS — M7989 Other specified soft tissue disorders: Secondary | ICD-10-CM | POA: Diagnosis not present

## 2016-06-08 DIAGNOSIS — M86171 Other acute osteomyelitis, right ankle and foot: Secondary | ICD-10-CM | POA: Diagnosis not present

## 2016-06-08 DIAGNOSIS — L97518 Non-pressure chronic ulcer of other part of right foot with other specified severity: Secondary | ICD-10-CM | POA: Insufficient documentation

## 2016-06-08 DIAGNOSIS — L97511 Non-pressure chronic ulcer of other part of right foot limited to breakdown of skin: Secondary | ICD-10-CM | POA: Diagnosis not present

## 2016-06-08 MED ORDER — CLINDAMYCIN HCL 300 MG PO CAPS: 300.0000 mg | ORAL_CAPSULE | Freq: Three times a day (TID) | ORAL | 0 refills | Status: DC

## 2016-06-08 NOTE — Progress Notes (Signed)
   Subjective:    Patient ID: Kristina Patel, female    DOB: 1945/12/15, 71 y.o.   MRN: [id number]  HPI   Ms. Mullan presents to the office today for concerns of an ulcer on the right 4th toe. She states this wound is been ongoing for about 1 month. She did go to the emergency room at the [facility] hospital and she is prescribed clindamycin for which she has been on for about 1 month. She's been putting antibiotic ointment on the wound and she has been soaking in Epson salts. She states that she still has some redness around the wound denies any red streaking. Denies any pus but occasionally has some clear bloody drainage. She does have neuropathy. No other complaints.   Review of Systems  All other systems reviewed and are negative.      Objective:   Physical Exam General: AAO x3, NAD  Dermatological: Ulceration present the distal aspect of the right fourth toe. The wound measures present 0.6 x 0.5  With granulation tissue present. Along the periphery of the wound has a definite proximal 0 point once in ears how there is mild hypertrophy along the central aspect of the wound. There is no probing, undermining or tunneling. There is faint erythema to the distal aspect of the toe but is no ascending cellulitis. There is no drainage or pus expressed today.No other lesions identified.  Vascular: Dorsalis Pedis artery and Posterior Tibial artery pedal pulses are 2/4 bilateral with immedate capillary fill time.  There is no pain with calf compression, swelling, warmth, erythema.   Neruologic: Sensation decreased with SWMF.   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   Gait: Unassisted, Nonantalgic.     Assessment & Plan:  71 year old female right fourth toe ulceration, rule out osteomyelitis -Treatment options discussed including all alternatives, risks, and complications -Etiology of symptoms were  discussed -X-rays ordered today.  -Wound was lightly debrided today. Silver nitrate was applied to the ulcer as there was hypergranulation tissue present in the center of the ulcer.  -Surgical shoe dispensed. Offloading pads applied to help take pressure off of the toe.  -Continue clindamycin -Ordered collagen and silver dressing to apply to the ulcer daily.  -Monitor for any clinical signs or symptoms of infection and directed to call the office immediately should any occur or go to the ER. -RTC 1 week or sooner if needed.   Celesta Gentile, DPM  *Addendum: x-rays did reveal likely osteomyelitis. Notified the patient. Will discuss further at her next appointment  *Addendum 06/09/16- Patient presented to the [facility] office for concerns of her toe bleeding. She did not injrury it. After removing the bandage the band-aid had pulled a piece of skin off of the medial aspect of the digit causing it to bleeding. The toe itself is the same as yesterday in regards to the ulcer. There is still erythema to the distal toe. Will add Levaquin which she has had previously without any issues. Discussed x-ray findings and possible amputation. She wishes to hold off for now. Will continue abx and re-x-ray to further evaluate. If there is any worsening in the mean time she will need to have surgery earlier.   Celesta Gentile, DPM

## 2016-06-08 NOTE — Patient Instructions (Signed)
Monitor for any signs/symptoms of infection. Call the office immediately if any occur or go directly to the emergency room. Call with any questions/concerns.  It was nice to meet you today. If you have any questions or concerns, please feel free to call me.

## 2016-06-08 NOTE — Telephone Encounter (Addendum)
-----   Message from [name], DPM sent at 06/08/2016  9:50 AM EDT ----- I was going to order a collagen w/ silver dressing for a right 4th toe ulcer through [facility]. Can we try that new company that came by the other day? I don't have their paper here in [facility].   It is a right 4th toe ulcer measuring 0.5 x 0.5 x 0.1 cm.   Thanks. Orders faxed to [facility]. I informed pt of Dr.Wagoner's review of x-rays and refilled the Clindamycin. Pt states her toe bleed through the dressing and soaked the shoe, should she try to clean it and I told her yes but if not satisfactory to bring to the [place] office and one of staff would take care of. Pt states understanding.06/09/2016-Pt presented to office to have her modified Darco shoe padding changed. I took pt to Rm 18 and pt removed 2 fabric bandaids from the right 4th toe and pulled skin from the lateral and plantar areas of the toe causing minor bleeding. I asked Dr. Jacqualyn Posey to evaluate and he ordered betadine to the area as well as the distal tip, and wrap with gauze no adhesive. I redressed as ordered and Dr. Jacqualyn Posey modified pt's shoe again.06/09/2016-[place] - [facility] request more clinical information concerning cause of pt's wound. Faxed LOV and 2017 [facility] note.

## 2016-06-08 NOTE — Telephone Encounter (Signed)
-----   Message from [name], DPM sent at 06/08/2016 12:47 PM EDT ----- Diagnosis is toe ulcer, limited to breakdown of skin  Can they do a small kling wrap and not kerlix? Thanks.  ----- Message ----- From: Andres Ege, RN Sent: 06/08/2016  11:51 AM To: [name], DPM  Dr. Jacqualyn Posey do you also want 2x2, roll and kerlix? Marcy Siren ----- Message ----- From: [name], DPM Sent: 06/08/2016   9:50 AM To: Andres Ege, RN  I was going to order a collagen w/ silver dressing for a right 4th toe ulcer through [facility]. Can we try that new company that came by the other day? I don't have their paper here in [facility].   It is a right 4th toe ulcer measuring 0.5 x 0.5 x 0.1 cm.   Thanks.

## 2016-06-08 NOTE — Telephone Encounter (Signed)
-----   Message from [name], DPM sent at 06/08/2016  1:25 PM EDT ----- Kristina Patel does show concern of bone infection in the toe. Please continue clindamycin 300mg  TID. Val- if she needs a refill please go ahead and refill. Please call her to let her know. I will also send a MyChart message but just wanted to make sure she gets it.

## 2016-06-09 ENCOUNTER — Ambulatory Visit: Payer: Medicare Other

## 2016-06-09 MED ORDER — LEVOFLOXACIN 500 MG PO TABS: 500.0000 mg | ORAL_TABLET | Freq: Every day | ORAL | 0 refills | Status: DC

## 2016-06-11 ENCOUNTER — Encounter: Payer: Self-pay | Admitting: Podiatry

## 2016-06-11 ENCOUNTER — Ambulatory Visit (INDEPENDENT_AMBULATORY_CARE_PROVIDER_SITE_OTHER): Payer: Medicare Other | Admitting: Podiatry

## 2016-06-11 DIAGNOSIS — L97511 Non-pressure chronic ulcer of other part of right foot limited to breakdown of skin: Secondary | ICD-10-CM

## 2016-06-11 DIAGNOSIS — M86171 Other acute osteomyelitis, right ankle and foot: Secondary | ICD-10-CM | POA: Diagnosis not present

## 2016-06-14 MED ORDER — SILVER SULFADIAZINE 1 % EX CREA: 1.0000 "application " | TOPICAL_CREAM | Freq: Every day | CUTANEOUS | 0 refills | Status: DC

## 2016-06-14 NOTE — Progress Notes (Signed)
Subjective: 71 year old female presents the office today for a limited to bleeding to the toe wound on the right fourth toe. She states that she states the wound started bleeding. She presents today with her husband. He states that when he removes the bandages stock to the wound and after removing that his inserts to bleeding. She denies any pus coming from the area. She has started on the Levaquin she is continuing with clindamycin as well. She did not get the supplies I ordered for her due to cost. They've been putting a dry bandage on the wound. Denies any systemic complaints such as fevers, chills, nausea, vomiting. No acute changes since last appointment, and no other complaints at this time.   Objective: AAO x3, NAD DP/PT pulses palpable bilaterally, CRT less than 3 seconds Hammertoe contractures are present. The distal aspect the right fourth toe is a granular wound with mild hypertrophy. The wound measures about the same size of 0.5 x 0.5 cm. There is no probing, undermining or tunneling. There is faint erythema to the distal portion of the toe as well as a localized edema but there is no ascending cellulitis, fluctuance, crepitus, malodor. Small abrasion still present on the medial aspect of the fourth toe from where the skin had been she'll often the bandage previously. A series female. Is also a small fissure in the second interspace which no drainage or pus. No other open lesions or pre-ulcerative lesions are identified bilaterally. There is no pain with calf compression, swelling, warmth, erythema.  Assessment: Right fourth toe with osteomyelitis  Plan: -Treatment options discussed including all alternatives, risks, and complications -Etiology of symptoms were discussed -Discussed with her given the x-ray findings with her husband today who accompanies her discussed osteomyelitis. Discussed the partial toe amputation which she wishes to hold off and try antibiotics this time. Discussed  with her signs and symptoms of worsening infection if any are to occur she'll likely need a hand proceed with surgery. -Will continue with Levaquin as well as clindamycin.  -Order Santyl to apply to the wound daily. -Monitor for any clinical signs or symptoms of infection and directed to call the office immediately should any occur or go to the ER. -RTC in 1 week or sooner if needed. Encouraged to call with any questions, concerns, or any change in symptoms.   Celesta Gentile, DPM

## 2016-06-15 ENCOUNTER — Ambulatory Visit: Payer: Medicare Other | Admitting: Podiatry

## 2016-06-22 ENCOUNTER — Ambulatory Visit (INDEPENDENT_AMBULATORY_CARE_PROVIDER_SITE_OTHER): Payer: Medicare Other | Admitting: Podiatry

## 2016-06-22 ENCOUNTER — Ambulatory Visit (HOSPITAL_BASED_OUTPATIENT_CLINIC_OR_DEPARTMENT_OTHER)
Admission: RE | Admit: 2016-06-22 | Discharge: 2016-06-22 | Disposition: A | Discharge status: Home / Self Care | Payer: Medicare Other | Source: Ambulatory Visit | Attending: Podiatry | Admitting: Podiatry

## 2016-06-22 DIAGNOSIS — L600 Ingrowing nail: Secondary | ICD-10-CM | POA: Diagnosis not present

## 2016-06-22 DIAGNOSIS — M86171 Other acute osteomyelitis, right ankle and foot: Secondary | ICD-10-CM

## 2016-06-22 MED ORDER — CLINDAMYCIN HCL 300 MG PO CAPS: 300.0000 mg | ORAL_CAPSULE | Freq: Three times a day (TID) | ORAL | 0 refills | Status: DC

## 2016-06-23 LAB — CBC WITH DIFFERENTIAL/PLATELET
Basophils Absolute: 0 10*3/uL (ref 0.0–0.2)
Basos: 1 %
EOS (ABSOLUTE): 0.1 10*3/uL (ref 0.0–0.4)
EOS: 3 %
HEMATOCRIT: 37.2 % (ref 34.0–46.6)
Hemoglobin: 12.5 g/dL (ref 11.1–15.9)
IMMATURE GRANULOCYTES: 0 %
Immature Grans (Abs): 0 10*3/uL (ref 0.0–0.1)
Lymphocytes Absolute: 2.5 10*3/uL (ref 0.7–3.1)
Lymphs: 47 %
MCH: 30.3 pg (ref 26.6–33.0)
MCHC: 33.6 g/dL (ref 31.5–35.7)
MCV: 90 fL (ref 79–97)
MONOCYTES: 8 %
MONOS ABS: 0.4 10*3/uL (ref 0.1–0.9)
NEUTROS PCT: 41 %
Neutrophils Absolute: 2.1 10*3/uL (ref 1.4–7.0)
Platelets: 200 10*3/uL (ref 150–379)
RBC: 4.13 x10E6/uL (ref 3.77–5.28)
RDW: 13.3 % (ref 12.3–15.4)
WBC: 5.1 10*3/uL (ref 3.4–10.8)

## 2016-06-23 LAB — BASIC METABOLIC PANEL
BUN/Creatinine Ratio: 17 (ref 12–28)
BUN: 17 mg/dL (ref 8–27)
CALCIUM: 8.6 mg/dL — AB (ref 8.7–10.3)
CO2: 21 mmol/L (ref 18–29)
CREATININE: 1.01 mg/dL — AB (ref 0.57–1.00)
Chloride: 103 mmol/L (ref 96–106)
GFR calc Af Amer: 66 mL/min/{1.73_m2} (ref >59)
GFR calc non Af Amer: 57 mL/min/{1.73_m2} — ABNORMAL LOW (ref >59)
GLUCOSE: 104 mg/dL — AB (ref 65–99)
POTASSIUM: 4.8 mmol/L (ref 3.5–5.2)
SODIUM: 140 mmol/L (ref 134–144)

## 2016-06-23 LAB — C-REACTIVE PROTEIN: CRP: 0.3 mg/L (ref 0.0–4.9)

## 2016-06-24 NOTE — Progress Notes (Signed)
Subjective: 71 year old female presents the office today follow-up evaluation of wound to the right fourth toe with osteomyelitis. She states the toe is getting somewhat better she denies any drainage or pus in the bleeding has stopped. She's been continuing with daily dressing changes. She's remain in the surgical shoe as well. She also states that she's had some drainage from ingrown to the left big toe. She has been soaking numbness been helping. She had posterior recently but not today. Overall she states that she feels well and she denies any systemic complaints such as fevers, chills, nausea, vomiting. No acute changes since last appointment, and no other complaints at this time.   Objective: AAO x3, NAD DP/PT pulses palpable bilaterally, CRT less than 3 seconds Hammertoe contractures are present. At the distal aspect of the right fourth toe is a superficial wound with minimal hyperkeratotic periwound. After debridement the wound measures lightly small 0.4 x 0.4 cm there is no probing. There is mild hyper granulation tissue present. There is still some mild edema to this last of the toe but the erythema appears to be improved. Is no drainage or pus or any malodor. There is no ascending cellulitis. The abrasion to the medial aspect of the digit has resolved. On the medial aspect of the left hallux is a ingrown toenail there is mild hyper granulation tissue present the distal aspect of the toe. After debridement no drainage or pus was expressed today. No other open lesions or pre-ulcerative lesions are identified bilaterally. There is no pain with calf compression, swelling, warmth, erythema.  Assessment: Right fourth toe with osteomyelitis, ulceration; ingrown toenail left hallux  Plan: -Treatment options discussed including all alternatives, risks, and complications -Repeat x-rays were obtained today. -Continue antibiotics. Reordered clindamycin -Bloodwork ordered today as well. -Continue  surgical shoe. -Continue doing daily dressing changes. I debrided the left hallux toenail to remove the symptomatic portion of the ingrown toenail. Recommend Neosporin and a bandage daily.- -Monitor for any clinical signs or symptoms of infection and directed to call the office immediately should any occur or go to the ER. -RTC in 1 week or sooner if needed. Encouraged to call with any questions, concerns, or any change in symptoms.   Celesta Gentile, DPM

## 2016-06-28 DIAGNOSIS — D649 Anemia, unspecified: Secondary | ICD-10-CM | POA: Insufficient documentation

## 2016-06-28 DIAGNOSIS — K5909 Other constipation: Secondary | ICD-10-CM | POA: Insufficient documentation

## 2016-07-01 ENCOUNTER — Encounter: Payer: Self-pay | Admitting: Podiatry

## 2016-07-01 ENCOUNTER — Ambulatory Visit (INDEPENDENT_AMBULATORY_CARE_PROVIDER_SITE_OTHER): Payer: Medicare Other | Admitting: Podiatry

## 2016-07-01 DIAGNOSIS — L97511 Non-pressure chronic ulcer of other part of right foot limited to breakdown of skin: Secondary | ICD-10-CM | POA: Diagnosis not present

## 2016-07-01 DIAGNOSIS — M86171 Other acute osteomyelitis, right ankle and foot: Secondary | ICD-10-CM | POA: Diagnosis not present

## 2016-07-01 DIAGNOSIS — L600 Ingrowing nail: Secondary | ICD-10-CM

## 2016-07-01 MED ORDER — LEVOFLOXACIN 500 MG PO TABS: 500.0000 mg | ORAL_TABLET | Freq: Every day | ORAL | 0 refills | Status: DC

## 2016-07-06 ENCOUNTER — Encounter: Payer: Self-pay | Admitting: Podiatry

## 2016-07-08 NOTE — Progress Notes (Signed)
Subjective: 71 year old female presents the office today follow-up evaluation of wound to the right fourth toe with osteomyelitis. She says that she is doing "okay". She states the toe does ache at times and she still has some swelling to the tip to on the right side. She still has some clear drainage of time as well as bloody denies any pus. She denies any red streaks. There is still swollen to the to the toe. The left big toenail is doing somewhat better. Has minimal swelling or redness but there is no pus. Denies any systemic complaints such as fevers, chills, nausea, vomiting. No acute changes since last appointment, and no other complaints at this time.   Objective: AAO x3, NAD DP/PT pulses palpable bilaterally, CRT less than 3 seconds Hammertoe contractures are present. At the distal aspect of the right fourth toe is a superficial wound with minimal hyperkeratotic periwound. After debridement the wound measures lightly small 0.3 x 0.3 cm there is no probing. The wound is no longer hyper granular. There is still some mild edema to this last of the toe but the erythema to the distal aspect of the toe. There is no drainage or pus or any malodor. There is no ascending cellulitis.  On the medial aspect of the left hallux is a ingrown toenail. There is faint resolving erythema. No drainage or pus today. Pain has improved.  No other open lesions or pre-ulcerative lesions are identified bilaterally. There is no pain with calf compression, swelling, warmth, erythema.  Assessment: Right fourth toe with osteomyelitis, ulceration; ingrown toenail left hallux  Plan: -Treatment options discussed including all alternatives, risks, and complications -At this time I long discussion with her in regards to amputation to versus trying to save it. She wishes to hold off amputation. Discussed risks and benefits of doing this. Today I did debride the wound lightly and a daily dressing changes. We'll restart Levaquin.  Ultimately she has osteomyelitis the distal portion of the toe which is been present since her to see in her. The wound is slowly healing however. We'll hold off on clindamycin and she was having some upset stomach with denies any diarrhea. -Continue surgical shoe. -Continue doing daily dressing changes. -I clean the left hallux nail border today. This isn't an doing a partial nail avulsion given slow healing and long-standing wound to the right side. -Monitor for any clinical signs or symptoms of infection and directed to call the office immediately should any occur or go to the ER. -RTC in 1 week or sooner if needed. Encouraged to call with any questions, concerns, or any change in symptoms.   Celesta Gentile, DPM

## 2016-07-09 ENCOUNTER — Encounter: Payer: Self-pay | Admitting: Podiatry

## 2016-07-09 ENCOUNTER — Ambulatory Visit (INDEPENDENT_AMBULATORY_CARE_PROVIDER_SITE_OTHER): Payer: Medicare Other | Admitting: Podiatry

## 2016-07-09 ENCOUNTER — Ambulatory Visit: Payer: Medicare Other

## 2016-07-09 VITALS — BP 141/74 | HR 78 | Resp 16

## 2016-07-09 DIAGNOSIS — M79674 Pain in right toe(s): Secondary | ICD-10-CM

## 2016-07-09 DIAGNOSIS — L600 Ingrowing nail: Secondary | ICD-10-CM

## 2016-07-09 DIAGNOSIS — M79675 Pain in left toe(s): Secondary | ICD-10-CM | POA: Diagnosis not present

## 2016-07-09 DIAGNOSIS — B351 Tinea unguium: Secondary | ICD-10-CM | POA: Diagnosis not present

## 2016-07-09 DIAGNOSIS — M86171 Other acute osteomyelitis, right ankle and foot: Secondary | ICD-10-CM | POA: Diagnosis not present

## 2016-07-09 DIAGNOSIS — L97511 Non-pressure chronic ulcer of other part of right foot limited to breakdown of skin: Secondary | ICD-10-CM | POA: Diagnosis not present

## 2016-07-12 ENCOUNTER — Ambulatory Visit: Payer: Medicare Other | Admitting: Podiatry

## 2016-07-12 NOTE — Progress Notes (Signed)
Subjective: 71 year old female presents the office today follow-up evaluation of wound to the right fourth toe with osteomyelitis. States that she has not had any drainage or pus recently. She does starting the fusion for IV antibiotics today at the [place]. She did see her primary care physician who thought this would be beneficial for her and she is scheduled for infectious disease consult as well. She is continuing with daily dressing changes to the toe. She states that she does still is better but is still swollen to the tip of the toe.  Extensive presents today for follow-up of a revision ingrown toenail is a left big toe. She said no issues to this toenail recently. No drainage or pus.    She also states that her nails trimmed with a thickened elongated and painful and she is nervous to trim them herself to avoid any other issues. Denies any drainage or pus coming from the other toenails.  Denies any systemic complaints such as fevers, chills, nausea, vomiting. No acute changes since last appointment, and no other complaints at this time.   Objective: AAO x3, NAD DP/PT pulses palpable bilaterally, CRT less than 3 seconds Hammertoe contractures are present. At the distal aspect of the right fourth toe is a superficial wound with minimal hyperkeratotic periwound. After debridement the wound measures lightly small 0.2 x 0.2 cm there is no probing. The wound is does look better overall today. There is still some mild edema to this last of the toe but the erythema to the distal aspect of the toe which is unchanged.. There is no drainage or pus or any malodor. There is no ascending cellulitis.  On the medial aspect of the left hallux is a ingrown toenail. There is no drainage or pus this area and there is no erythema, ascending cellulitis. There is no pain. Remaining toes are hypertrophic, to strip, discolored and elongated with yellow to brown discoloration. There is tenderness to the nails 1-5 bilaterally  except for the right fourth toe which had previously debrided. This any redness or drainage from the toenail sites. No other open lesions or pre-ulcerative lesions. There is no pain with calf compression, swelling, warmth, erythema.  Assessment: Right fourth toe with osteomyelitis, ulceration; ingrown toenail left hallux which is much better; symptomatic onychomycosis   Plan: -Treatment options discussed including all alternatives, risks, and complications -Regards the toe ulceration she is scheduled to start IV antibiotics today. I discussed with her continue with local wound care as well. Continue offloading to the area. Ultimately I believe that she may end up with an amputation of the distal portion the toe but she wishes to hold off on this. I discussed risks and benefits of surgery as well as limb salvage. This point she wishes to continue with limb salvage and save the toe. Monitor for any clinical signs or symptoms of infection and directed to call the office immediately should any occur or go to the ER. -Ingrown toenail appears to be resolved. Monitor for recurrence. -Nails sharply debrided 10 without complications or bleeding. -RTC as scheduled or sooner if needed. Encouraged to call with any questions, concerns, or any change in symptoms.   Celesta Gentile, DPM

## 2016-07-14 ENCOUNTER — Encounter: Payer: Self-pay | Admitting: Podiatry

## 2016-07-20 ENCOUNTER — Ambulatory Visit: Payer: Medicare Other | Admitting: Podiatry

## 2016-08-10 ENCOUNTER — Encounter: Payer: Self-pay | Admitting: Podiatry

## 2016-08-10 ENCOUNTER — Ambulatory Visit (INDEPENDENT_AMBULATORY_CARE_PROVIDER_SITE_OTHER): Payer: Medicare Other | Admitting: Podiatry

## 2016-08-10 DIAGNOSIS — R234 Changes in skin texture: Secondary | ICD-10-CM

## 2016-08-10 DIAGNOSIS — M79671 Pain in right foot: Secondary | ICD-10-CM | POA: Diagnosis not present

## 2016-08-10 NOTE — Progress Notes (Signed)
Subjective: 71 year old female presents the office today for concerns of a sore spot on the right foot. He states that she cannot see if that she has noticed a spot immature third and fourth toe which she starts to get pain. She is also redness or red spot this area. 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 Silvio Clayman from the [facility]. She denies any increase in swelling she denies any drainage or pus. She's had no recent treatment for this. Denies any systemic complaints such as fevers, chills, nausea, vomiting. No acute changes since last appointment, and no other complaints at this time.   Objective: AAO x3, NAD DP/PT pulses palpable bilaterally, CRT less than 3 seconds Distal right fourth toe amputation which is well-healed. All the interspaces to the third and fourth toe is a small area of the skin fissure with a slight opening with a granular wound base measuring approximately 0.3 x 0.1 cm and is tenderness palpation was area. There is no surrounding edema erythema, increase in warmth. There is no ascending saline's. There is no drainage or pus expressed.   No open lesions or pre-ulcerative lesions.  No pain with calf compression, swelling, warmth, erythema  Assessment: Skin fissure right third interspace without signs of infection   Plan: -All treatment options discussed with the patient including all alternatives, risks, complications.  -Recommended a small amount antibiotic ointment and a bandage this area daily. Dry thoroughly between her toes.  -Monitor for any clinical signs or symptoms of infection and directed to call the office immediately should any occur or go to the ER. -RTC as scheduled or sooner if needed.  -Patient encouraged to call the office with any questions, concerns, change in symptoms.   Kristina Patel, DPM

## 2016-08-30 ENCOUNTER — Ambulatory Visit: Payer: Self-pay | Admitting: Family Medicine

## 2016-09-02 ENCOUNTER — Ambulatory Visit: Payer: Medicare Other | Admitting: Podiatry

## 2016-09-07 ENCOUNTER — Encounter: Payer: Self-pay | Admitting: Podiatry

## 2016-09-07 ENCOUNTER — Ambulatory Visit (INDEPENDENT_AMBULATORY_CARE_PROVIDER_SITE_OTHER): Payer: Medicare Other | Admitting: Podiatry

## 2016-09-07 DIAGNOSIS — L97511 Non-pressure chronic ulcer of other part of right foot limited to breakdown of skin: Secondary | ICD-10-CM | POA: Diagnosis not present

## 2016-09-07 DIAGNOSIS — L600 Ingrowing nail: Secondary | ICD-10-CM

## 2016-09-07 MED ORDER — CLINDAMYCIN HCL 300 MG PO CAPS: 300.0000 mg | ORAL_CAPSULE | Freq: Three times a day (TID) | ORAL | 0 refills | Status: DC

## 2016-09-08 NOTE — Progress Notes (Signed)
Subjective: Kristina Patel presents the office today for concerns of 2 complaints. She states that over the weekend she was doing a lot of shopping and she notices a large blister formed on her right third toe. She has a blister has drained and there is redness of the toe, wound present. Denies any drainage or pus and denies any red streaks. She also has tried to trim her big toenail on her right side and she cut the corner to close and she has noticed the nails become painful and there is a piece of skin within the nail border. Denies any drainage or pus coming from the area. She has no other concerns. The area that she can't find last appointment is healed. Denies any systemic complaints such as fevers, chills, nausea, vomiting. No acute changes since last appointment, and no other complaints at this time.   Objective: AAO x3, NAD DP/PT pulses palpable bilaterally, CRT less than 3 seconds There appears to be some epidermal lysis present to the right third toe from where the blister had come off. There is granular red tissue present along the area and superficial. There is minimal edema to the toenail is no ascending synovitis is surrounding erythema. The area redness that she is experiencing is though granular wound that was present underneath the blister site. There is no drainage or pus in there is no ascending cellulitis. Along the medial aspect of the right hallux toenail there is a small little granulation tissue present in the nail corner and there is no drainage or pus. She does have neuropathy and there is no pain. Is able to shop and debrided the quarter the toenail any complications and there is no drainage or pus expressed. No open lesions or pre-ulcerative lesions.  No pain with calf compression, swelling, warmth, erythema  Assessment: Right third digit blister with underlying granular wound; ingrown toenail  Plan: -All treatment options discussed with the patient including all alternatives,  risks, complications.  -Recommend a small amount antibiotic ointment dressing changes daily to the right third toe. Continue/return to surgical shoe. -I was able to sharply debride the medial aspect of the right hallux toenail to any complications. Recommended and buttock ointment dressing changes daily. Surgical shoe. -Prescribed clindamycin. -Monitor for any clinical signs or symptoms of infection and directed to call the office immediately should any occur or go to the ER. -Patient encouraged to call the office with any questions, concerns, change in symptoms.   Celesta Gentile, DPM

## 2016-09-14 ENCOUNTER — Ambulatory Visit (INDEPENDENT_AMBULATORY_CARE_PROVIDER_SITE_OTHER): Payer: Medicare Other | Admitting: Podiatry

## 2016-09-14 ENCOUNTER — Ambulatory Visit (HOSPITAL_BASED_OUTPATIENT_CLINIC_OR_DEPARTMENT_OTHER)
Admission: RE | Admit: 2016-09-14 | Discharge: 2016-09-14 | Disposition: A | Discharge status: Home / Self Care | Payer: Medicare Other | Source: Ambulatory Visit | Attending: Podiatry | Admitting: Podiatry

## 2016-09-14 DIAGNOSIS — L600 Ingrowing nail: Secondary | ICD-10-CM

## 2016-09-14 DIAGNOSIS — L03031 Cellulitis of right toe: Secondary | ICD-10-CM | POA: Diagnosis not present

## 2016-09-14 DIAGNOSIS — L97511 Non-pressure chronic ulcer of other part of right foot limited to breakdown of skin: Secondary | ICD-10-CM

## 2016-09-14 DIAGNOSIS — L02611 Cutaneous abscess of right foot: Secondary | ICD-10-CM

## 2016-09-14 MED ORDER — LEVOFLOXACIN 500 MG PO TABS: 500.0000 mg | ORAL_TABLET | Freq: Every day | ORAL | 0 refills | Status: DC

## 2016-09-14 MED ORDER — SULFAMETHOXAZOLE-TRIMETHOPRIM 800-160 MG PO TABS: 1.0000 | ORAL_TABLET | Freq: Two times a day (BID) | ORAL | 0 refills | Status: DC

## 2016-09-14 NOTE — Progress Notes (Signed)
Subjective: [name] to the office today for follow-up evaluation of an ulcer/blister to the right 3rd toe and for an ingrown toenail on the right big toe. She states that she was doing well until Saturday when she went to a concert and was doing a lot of walking and since then she started to have worsened the third toe. She also states that after her last the point she had chemotherapy. She is not been wearing the surgical shoe.Denies any systemic complaints such as fevers, chills, nausea, vomiting. No acute changes since last appointment, and no other complaints at this time.   Objective: AAO x3, NAD DP/PT pulses palpable bilaterally, CRT less than 3 seconds The dorsal aspect of the right third toes a superficial granular wound present. There is evidence of epidermolysis around the area this for the skin in peeled off. This appears to be superficial. His new areas superficial granulation tissue however due to the distal aspect of the third toe. There is some mild edema to the third toe as well as erythema but this erythema stops just distal to the MPJ and there is no evidence of ascending synovitis. Is no fluctuance or crepitus there is no malodor. Ingrowing on both the medial and lateral assess the left hallux toenail. There is evidence of healing and granulation tissue scattered started to form after a debrided the areas. Small amount of clear drainage but there is no pus. No open lesions or pre-ulcerative lesions.  No pain with calf compression, swelling, warmth, erythema  Assessment: Ulceration right third toe with cellulitis, ingrown toenail  Plan: -All treatment options discussed with the patient including all alternatives, risks, complications.  -There was a small amount active bleeding present and symmetric was utilized to stop the bleeding. She states that she cannot do scab deformity over the area. We'll switch to a dressing changes. They give her some of this to do. Also  dispensed a surgical shoe to help take pressure off the area. She states that she does not have a home anymore. Also often has were dispensed help take pressure off the toe. We'll switch to Levaquin which was sent to her pharmacy. She's been on this before without any issues. I ordered an x-ray of the right foot. -Continue antibiotic ointment dressing changes of the hallux daily. -Monitor for any clinical signs or symptoms of infection and directed to call the office immediately should any occur or go to the ER. -RTC 1 week or sooner if needed.  -Patient encouraged to call the office with any questions, concerns, change in symptoms.   Celesta Gentile, DPM

## 2016-09-14 NOTE — Patient Instructions (Signed)

## 2016-09-15 ENCOUNTER — Telehealth: Payer: Self-pay | Admitting: *Deleted

## 2016-09-15 ENCOUNTER — Other Ambulatory Visit: Payer: Self-pay | Admitting: Podiatry

## 2016-09-15 DIAGNOSIS — M869 Osteomyelitis, unspecified: Secondary | ICD-10-CM

## 2016-09-15 NOTE — Telephone Encounter (Signed)
[facility] called x-ray report - Right foot - evaluation 3rd distal phalanx is limited due to overlying bandage material, there maybe some bony destruction of 3rd distal tuft, which could represent osteomyelitis. Recommend repeat x-ray after removing the bandage. Interval resection of 4th middle and distal phalanges. 09/15/2016-Results routed to Dr. Jacqualyn Posey.

## 2016-09-15 NOTE — Progress Notes (Signed)
I called the patient discussed the x-ray results from yesterday. Concern for osteomyelitis but difficult to evaluate given the bandage. She's been a go back to [facility] tomorrow in order to have a repeat x-ray performed without the bandage on. Patient was quick to get off of the phone but I did tell her to continue antibiotics. Unable to ask other questions in regards to how she was doing.   Celesta Gentile, DPM

## 2016-09-15 NOTE — Telephone Encounter (Signed)
I called the patient to discuss

## 2016-09-16 ENCOUNTER — Other Ambulatory Visit: Payer: Self-pay | Admitting: Podiatry

## 2016-09-16 ENCOUNTER — Ambulatory Visit (HOSPITAL_BASED_OUTPATIENT_CLINIC_OR_DEPARTMENT_OTHER)
Admission: RE | Admit: 2016-09-16 | Discharge: 2016-09-16 | Disposition: A | Discharge status: Home / Self Care | Payer: Medicare Other | Source: Ambulatory Visit | Attending: Podiatry | Admitting: Podiatry

## 2016-09-16 DIAGNOSIS — Z8781 Personal history of (healed) traumatic fracture: Secondary | ICD-10-CM | POA: Diagnosis not present

## 2016-09-16 DIAGNOSIS — M86171 Other acute osteomyelitis, right ankle and foot: Secondary | ICD-10-CM | POA: Insufficient documentation

## 2016-09-16 DIAGNOSIS — M2011 Hallux valgus (acquired), right foot: Secondary | ICD-10-CM | POA: Insufficient documentation

## 2016-09-16 DIAGNOSIS — M8588 Other specified disorders of bone density and structure, other site: Secondary | ICD-10-CM

## 2016-09-16 DIAGNOSIS — M869 Osteomyelitis, unspecified: Secondary | ICD-10-CM

## 2016-09-16 DIAGNOSIS — M7731 Calcaneal spur, right foot: Secondary | ICD-10-CM | POA: Diagnosis not present

## 2016-09-20 ENCOUNTER — Ambulatory Visit (INDEPENDENT_AMBULATORY_CARE_PROVIDER_SITE_OTHER): Payer: Medicare Other | Admitting: Podiatry

## 2016-09-20 ENCOUNTER — Other Ambulatory Visit: Payer: Self-pay | Admitting: Podiatry

## 2016-09-20 ENCOUNTER — Encounter: Payer: Self-pay | Admitting: Podiatry

## 2016-09-20 VITALS — BP 162/92 | HR 101 | Temp 97.4°F | Resp 18

## 2016-09-20 DIAGNOSIS — L02611 Cutaneous abscess of right foot: Secondary | ICD-10-CM | POA: Diagnosis not present

## 2016-09-20 DIAGNOSIS — L97511 Non-pressure chronic ulcer of other part of right foot limited to breakdown of skin: Secondary | ICD-10-CM

## 2016-09-20 DIAGNOSIS — L600 Ingrowing nail: Secondary | ICD-10-CM | POA: Diagnosis not present

## 2016-09-20 DIAGNOSIS — L03031 Cellulitis of right toe: Secondary | ICD-10-CM

## 2016-09-20 MED ORDER — DOXYCYCLINE HYCLATE 100 MG PO TABS: 100.0000 mg | ORAL_TABLET | Freq: Two times a day (BID) | ORAL | 0 refills | Status: DC

## 2016-09-21 ENCOUNTER — Ambulatory Visit: Payer: Medicare Other | Admitting: Podiatry

## 2016-09-21 LAB — CBC WITH DIFFERENTIAL/PLATELET
Basophils Absolute: 0.1 10*3/uL (ref 0.0–0.2)
Basos: 1 %
EOS (ABSOLUTE): 0.2 10*3/uL (ref 0.0–0.4)
Eos: 3 %
Hematocrit: 34.7 % (ref 34.0–46.6)
Hemoglobin: 12.1 g/dL (ref 11.1–15.9)
IMMATURE GRANS (ABS): 0 10*3/uL (ref 0.0–0.1)
IMMATURE GRANULOCYTES: 1 %
LYMPHS: 53 %
Lymphocytes Absolute: 2.7 10*3/uL (ref 0.7–3.1)
MCH: 28.7 pg (ref 26.6–33.0)
MCHC: 34.9 g/dL (ref 31.5–35.7)
MCV: 82 fL (ref 79–97)
MONOS ABS: 0.5 10*3/uL (ref 0.1–0.9)
Monocytes: 10 %
NEUTROS PCT: 32 %
Neutrophils Absolute: 1.6 10*3/uL (ref 1.4–7.0)
PLATELETS: 236 10*3/uL (ref 150–379)
RBC: 4.22 x10E6/uL (ref 3.77–5.28)
RDW: 14 % (ref 12.3–15.4)
WBC: 5.1 10*3/uL (ref 3.4–10.8)

## 2016-09-21 LAB — C-REACTIVE PROTEIN

## 2016-09-21 LAB — AMBIG ABBREV CMP14 DEFAULT

## 2016-09-21 LAB — SEDIMENTATION RATE: SED RATE: 2 mm/h (ref 0–40)

## 2016-09-21 NOTE — Progress Notes (Signed)
Subjective: Kristina Patel presents the office either husband as a walk-in appointment concern of worsening infection of the right third toe. She states she has had the toe yesterday she has not no cement today she is concerned is not healing. She denies any increase in redness or any red streaks up her foot. She also states that the right big toenails not healing she has noticed some redness around the inside, medial, toenail. Denies any pus or any drainage otherwise. She states that she has tried to be offered but is much as possible and she has not done much walking. Denies any systemic complaints such as fevers, chills, nausea, vomiting. No acute changes since last appointment, and no other complaints at this time.   Objective: AAO x3, NAD DP/PT pulses palpable bilaterally, CRT less than 3 seconds Sensation absent with Simms Weinstein monofilament. Hammertoes are present. To the dorsal aspect of the right third toe the wound actually appears to be somewhat improved and there is a superficial granular wound present. There is no swelling erythema, ascending synovitis. Is no fluctuance or crepitus there is no malodor. Small scab the distal portion of tone upon debridement is a small area of granulation tissue without any drainage or pus. There is no probing to bone, undermining or tunneling to any of the wounds. Minimal edema to the toe. Along the right medial hallux toenail is incurvation along the medial aspect of the toenail is localized edema and erythema to this area. No drainage or pus expressed. There is no ascending synovitis.  No open lesions or pre-ulcerative lesions are identified otherwise. No pain with calf compression, swelling, warmth, erythema  Assessment: Superficial wound right third toe with infected ingrown toenail right hallux  Plan: -All treatment options discussed with the patient including all alternatives, risks, complications.  -Regards the third toe I did recent hyperkeratotic  tissue that is from the wound. Overall the wound appears to be somewhat improved. Recommended continue with Iodosorb dressing changes daily. -Regards the right medial hallux ingrown toenail was able to remove the medial portion the ingrown toenail that any complications after the area is prepped with Betadine. She had no feeling to the foot to therefore did not need to use local anesthetic. After removal the medial portion ingrown toenail unable to identify any further toenail that may be causing this. I did also debride a small amount of the hyperventilation tissue that was present within the nail corners well. There is no purulence expressed. Abdomen antibiotic on a dressing changes daily to this area. -Finish course of Levaquin. Once this completed we will start doxycycline which is [facility] and [facility]. -Continue surgical shoe. -Discussed the symptoms are worsening that she made with amputation of this toe which we are trying to prevent.  -Monitor for any clinical signs or symptoms of infection and directed to call the office immediately should any occur or go to the ER. -Bloodwork ordered today including CBC, CMP, CRP, sedimentation rate. -RTC 1 week or sooner if needed.  -Patient encouraged to call the office with any questions, concerns, change in symptoms.   *If no improvement consider wound care referral   Celesta Gentile, DPM

## 2016-09-28 ENCOUNTER — Encounter: Payer: Self-pay | Admitting: Podiatry

## 2016-09-28 ENCOUNTER — Ambulatory Visit (HOSPITAL_BASED_OUTPATIENT_CLINIC_OR_DEPARTMENT_OTHER)
Admission: RE | Admit: 2016-09-28 | Discharge: 2016-09-28 | Disposition: A | Discharge status: Home / Self Care | Payer: Medicare Other | Source: Ambulatory Visit | Attending: Podiatry | Admitting: Podiatry

## 2016-09-28 ENCOUNTER — Ambulatory Visit (INDEPENDENT_AMBULATORY_CARE_PROVIDER_SITE_OTHER): Payer: Medicare Other | Admitting: Podiatry

## 2016-09-28 ENCOUNTER — Ambulatory Visit: Payer: Medicare Other | Admitting: Podiatry

## 2016-09-28 DIAGNOSIS — Z89421 Acquired absence of other right toe(s): Secondary | ICD-10-CM | POA: Insufficient documentation

## 2016-09-28 DIAGNOSIS — L97511 Non-pressure chronic ulcer of other part of right foot limited to breakdown of skin: Secondary | ICD-10-CM

## 2016-09-28 DIAGNOSIS — L03031 Cellulitis of right toe: Secondary | ICD-10-CM

## 2016-09-28 DIAGNOSIS — M2011 Hallux valgus (acquired), right foot: Secondary | ICD-10-CM | POA: Insufficient documentation

## 2016-09-28 DIAGNOSIS — L600 Ingrowing nail: Secondary | ICD-10-CM

## 2016-09-28 DIAGNOSIS — L02611 Cutaneous abscess of right foot: Secondary | ICD-10-CM | POA: Insufficient documentation

## 2016-09-28 DIAGNOSIS — Z8781 Personal history of (healed) traumatic fracture: Secondary | ICD-10-CM | POA: Insufficient documentation

## 2016-09-28 NOTE — Progress Notes (Signed)
Subjective: [name] presents to the office with a friend for follow-up evaluation of an ulceration to the right third toe as well as for ingrown toenail to the right big toe. She states that she has been using Iodosorb to the right third toe and interbody comments the big toe daily basis paternally the area uncovered as much as possible while at home. She also continue surgical shoe. She is also continue the antibiotics as completed her course. She has no new concerns.  Denies any systemic complaints such as fevers, chills, nausea, vomiting. No acute changes since last appointment, and no other complaints at this time.   Objective: AAO x3, NAD DP/PT pulses palpable bilaterally, CRT less than 3 seconds Very small superficial abrasion type lesion to the dorsal aspect of the right third toe. Upon debridement appears to be almost healed at this point. There is no drainage or pus in there is no probing. Faint amount of erythema to the distal portion toe hallux appears to be improving. There is no increase in warmth and there is no ascending synovitis. Is no fluctuance or crepitus.  On the medial aspect of the right hallux toenail status post partial nail avulsion the areas healing well. Very minimal amount of clear drainage is expressed but there is no pus. Faint swelling erythema but this is improved and there is no ascending synovitis. There is no fluctuance or crepitus there is no malodor.  No open lesions or pre-ulcerative lesions.  No pain with calf compression, swelling, warmth, erythema  Assessment: Healing ulceration right third toe, ingrown toenail right hallux   Plan: -All treatment options discussed with the patient including all alternatives, risks, complications.  -X-rays were obtained. Unable to appreciate any evidence of acute osteomyelitis however not definitive at this time. We'll await the radiologist read. Discussed his x-ray results with the patient. -Likely debrided the wound the  third toe without any complications or bleeding. -Continue that his were dressing changes the third toe daily as well as into buttock ointment to the hallux. Leave the area uncovered at nighttime around the house to help the area dried. The wounds are much improved. -Finish course of antibiotics. At that point we will hold off any further antibiotics at this point after she completes her course and observe the area she is doing much better. -Continue surgical shoe. -Elevation -Limit activity -RTC 2 weeks or sooner if needed. -Monitor for any clinical signs or symptoms of infection and directed to call the office immediately should any occur or go to the ER. -Patient encouraged to call the office with any questions, concerns, change in symptoms.   Celesta Gentile, DPM

## 2016-10-05 ENCOUNTER — Telehealth: Payer: Self-pay | Admitting: Podiatry

## 2016-10-05 NOTE — Telephone Encounter (Signed)
Pt. Wanted you to know that her husband, has to have surgery on the 21st so she needed to r/s. Just wondering if that would be ok. She is scheduled for the 28th.

## 2016-10-06 NOTE — Telephone Encounter (Signed)
That is fine. She was doing well. If she needs more please have her call and come in when she can. If there is any worsening to call the office before.

## 2016-10-12 ENCOUNTER — Ambulatory Visit: Payer: Medicare Other | Admitting: Podiatry

## 2016-10-19 ENCOUNTER — Encounter: Payer: Self-pay | Admitting: Podiatry

## 2016-10-19 ENCOUNTER — Ambulatory Visit (INDEPENDENT_AMBULATORY_CARE_PROVIDER_SITE_OTHER): Payer: Medicare Other | Admitting: Podiatry

## 2016-10-19 DIAGNOSIS — L97511 Non-pressure chronic ulcer of other part of right foot limited to breakdown of skin: Secondary | ICD-10-CM

## 2016-10-19 DIAGNOSIS — M79674 Pain in right toe(s): Secondary | ICD-10-CM | POA: Diagnosis not present

## 2016-10-19 DIAGNOSIS — B351 Tinea unguium: Secondary | ICD-10-CM | POA: Diagnosis not present

## 2016-10-19 DIAGNOSIS — M79675 Pain in left toe(s): Secondary | ICD-10-CM | POA: Diagnosis not present

## 2016-10-19 NOTE — Progress Notes (Signed)
Subjective: 71 y.o. returns the office today for painful, elongated, thickened toenails which she cannot trim herself she also presents today for follow-up e or toe which he states is healed today. She has continued with the surgical shoe. Denies any redness or drainage around the nails. She states the wound to the right third toe ha recently. Denies any acute changes since last appointment and no new complaints today. Denies any systemic complaints such as fevers, chills, nausea, vomiting.   Objective: AAO 3, NAD DP/PT pulses palpable, CRT less than 3 seconds Protective sensation decreased with Simms Weinstein monofilament, Achilles tendon reflex intact.  Nails hypertrophic, dystrophic, elongated, brittle, discolored 9. There is tenderness overlying the nails 1-5 bilaterally except for the right fourth toe which . There is no surrounding erythema or drainage along the nail sites. The wound to the dorsal aspect the right third toe has healed. There is no  No open lesions or pre-ulcerative lesions are identified. No other areas of tenderness bilateral lower extremities. No overlying edema, erythema, increased warmth. No pain with calf compression, swelling, warmth, erythema.  Assessment: Patient presents with symptomatic onychomycosis; healed wound right third toe  Plan: -Treatment options including alternatives, risks, complications were discussed -Nails sharply debrided 9 without complication/bleeding. -she inserted transition to a regular shoe. Discussed to monitor her feet closely  -Discussed daily foot inspection. If there are any changes, to call the office immediately.  -Follow-up in 9 weeks or sooner if any problems are to arise. In the meantime, encouraged to call the office with any questions, concerns, changes symptoms.  Celesta Gentile, DPM

## 2016-12-14 ENCOUNTER — Ambulatory Visit (INDEPENDENT_AMBULATORY_CARE_PROVIDER_SITE_OTHER): Payer: Medicare Other | Admitting: Podiatry

## 2016-12-14 ENCOUNTER — Encounter: Payer: Self-pay | Admitting: Podiatry

## 2016-12-14 DIAGNOSIS — M79675 Pain in left toe(s): Secondary | ICD-10-CM

## 2016-12-14 DIAGNOSIS — B351 Tinea unguium: Secondary | ICD-10-CM

## 2016-12-14 DIAGNOSIS — M79674 Pain in right toe(s): Secondary | ICD-10-CM | POA: Diagnosis not present

## 2016-12-14 NOTE — Progress Notes (Signed)
Subjective: 71 y.o. returns the office today for painful, elongated, thickened toenails which she cannot trim herself. Denies any redness or drainage around the nails. She has noticed any open sores or any swelling or redness to her feet she states that she is doing well. Denies any acute changes since last appointment and no new complaints today. Denies any systemic complaints such as fevers, chills, nausea, vomiting.   PCP: Shelda Pal, DO  Objective: AAO 3, NAD DP/PT pulses palpable, CRT less than 3 seconds Protective sensation decreased with Simms Weinstein monofilament Nails hypertrophic, dystrophic, elongated, brittle, discolored 9. There is tenderness overlying the nails 1-5 bilaterally except the right 4th toe which has been partially amputated. There is no surrounding erythema or drainage along the nail sites. No open lesions or pre-ulcerative lesions are identified. No other areas of tenderness bilateral lower extremities. No overlying edema, erythema, increased warmth. No pain with calf compression, swelling, warmth, erythema.  Assessment: Patient presents with symptomatic onychomycosis  Plan: -Treatment options including alternatives, risks, complications were discussed -Nails sharply debrided 9 without complication/bleeding. -Discussed daily foot inspection. If there are any changes, to call the office immediately.  -Follow-up in 3 months or sooner if any problems are to arise. In the meantime, encouraged to call the office with any questions, concerns, changes symptoms.  Celesta Gentile, DPM

## 2017-03-03 DIAGNOSIS — R69 Illness, unspecified: Secondary | ICD-10-CM | POA: Diagnosis not present

## 2017-03-08 ENCOUNTER — Ambulatory Visit: Payer: Self-pay | Admitting: Podiatry

## 2017-03-14 ENCOUNTER — Ambulatory Visit: Payer: Self-pay | Admitting: Podiatry

## 2017-03-15 DIAGNOSIS — M25512 Pain in left shoulder: Secondary | ICD-10-CM | POA: Diagnosis not present

## 2017-03-15 DIAGNOSIS — M4712 Other spondylosis with myelopathy, cervical region: Secondary | ICD-10-CM | POA: Diagnosis not present

## 2017-03-15 DIAGNOSIS — M4306 Spondylolysis, lumbar region: Secondary | ICD-10-CM | POA: Diagnosis not present

## 2017-03-16 DIAGNOSIS — M4306 Spondylolysis, lumbar region: Secondary | ICD-10-CM | POA: Insufficient documentation

## 2017-03-16 DIAGNOSIS — M4712 Other spondylosis with myelopathy, cervical region: Secondary | ICD-10-CM | POA: Insufficient documentation

## 2017-03-17 ENCOUNTER — Other Ambulatory Visit: Payer: Self-pay | Admitting: Nurse Practitioner

## 2017-03-17 DIAGNOSIS — M25512 Pain in left shoulder: Secondary | ICD-10-CM

## 2017-03-25 ENCOUNTER — Ambulatory Visit
Admission: RE | Admit: 2017-03-25 | Discharge: 2017-03-25 | Disposition: A | Discharge status: Home / Self Care | Payer: Medicare HMO | Source: Ambulatory Visit | Attending: Nurse Practitioner | Admitting: Nurse Practitioner

## 2017-03-25 DIAGNOSIS — M25512 Pain in left shoulder: Secondary | ICD-10-CM

## 2017-03-25 MED ORDER — IOPAMIDOL (ISOVUE-M 200) INJECTION 41%
1.0000 mL | Freq: Once | INTRAMUSCULAR | Status: AC
  Administered 2017-03-25: 1 mL via INTRA_ARTICULAR

## 2017-03-25 MED ORDER — METHYLPREDNISOLONE ACETATE 40 MG/ML INJ SUSP (RADIOLOG
120.0000 mg | Freq: Once | INTRAMUSCULAR | Status: AC
  Administered 2017-03-25: 120 mg via INTRA_ARTICULAR

## 2017-03-31 ENCOUNTER — Telehealth: Payer: Self-pay | Admitting: Family Medicine

## 2017-03-31 NOTE — Telephone Encounter (Signed)
Patient dropped off two copies of two different bills to have submit for review. Patient stts [employer] adv that Nani Ravens was not with them. Adv I can submit for review. Please call patient once  Answers have been recd.

## 2017-04-04 NOTE — Telephone Encounter (Signed)
Per account notes from billing 03/10/16 these claims have been re-filed with [facility] and there is no longer an outstanding balance on the patients account.  Dr. Nani Ravens has been in-network with patients plan since 11/23/15 according to note.  LM for patient to return my call to explain

## 2017-04-07 NOTE — Telephone Encounter (Signed)
Patient returned my call, I explained to her that her claims had been refilled and paid with [facility].   One of her bills was from a [facility] and she will follow up with their office in regards to that.

## 2017-04-12 ENCOUNTER — Ambulatory Visit: Payer: Medicare HMO | Admitting: Podiatry

## 2017-04-12 ENCOUNTER — Encounter: Payer: Self-pay | Admitting: Podiatry

## 2017-04-12 DIAGNOSIS — M79674 Pain in right toe(s): Secondary | ICD-10-CM | POA: Diagnosis not present

## 2017-04-12 DIAGNOSIS — B351 Tinea unguium: Secondary | ICD-10-CM

## 2017-04-12 DIAGNOSIS — M79675 Pain in left toe(s): Secondary | ICD-10-CM | POA: Diagnosis not present

## 2017-04-13 NOTE — Progress Notes (Signed)
Subjective: 72 y.o. returns the office today for painful, elongated, thick toenails which she cannot trim herself. Denies any redness or drainage around the nails. She smooths the calluses herself at home. She denies any new wounds or swelling. Denies any acute changes since last appointment and no new complaints today. Denies any systemic complaints such as fevers, chills, nausea, vomiting.   PCP: Shelda Pal, DO  Objective: AAO 3, NAD DP/PT pulses palpable, CRT less than 3 seconds Protective sensation decreased with Simms Weinstein monofilament, Achilles tendon reflex intact.  Nails hypertrophic, dystrophic, elongated, brittle, discolored 9. There is tenderness overlying the nails 1-5 bilaterally except the right 4th toe. There is no surrounding erythema or drainage along the nail sites. No open lesions or pre-ulcerative lesions are identified. No other areas of tenderness bilateral lower extremities. No overlying edema, erythema, increased warmth. No pain with calf compression, swelling, warmth, erythema.  Assessment: Patient presents with symptomatic onychomycosis  Plan: -Treatment options including alternatives, risks, complications were discussed -Nails sharply debrided 9 without complication/bleeding. -Recommended her not to try to trim the calluses herself.  -Discussed daily foot inspection. If there are any changes, to call the office immediately.  -Follow-up in 3 months or sooner if any problems are to arise. In the meantime, encouraged to call the office with any questions, concerns, changes symptoms.  Celesta Gentile, DPM

## 2017-05-03 IMAGING — DX DG FOOT COMPLETE 3+V*R*
3 series · 3 of 3 positions shown · non-contrast
Comparison: None.

CLINICAL DATA: Sore on the distal aspect of the right fourth toe
since yesterday.

EXAM:
RIGHT FOOT COMPLETE - 3+ VIEW

[foot ap]
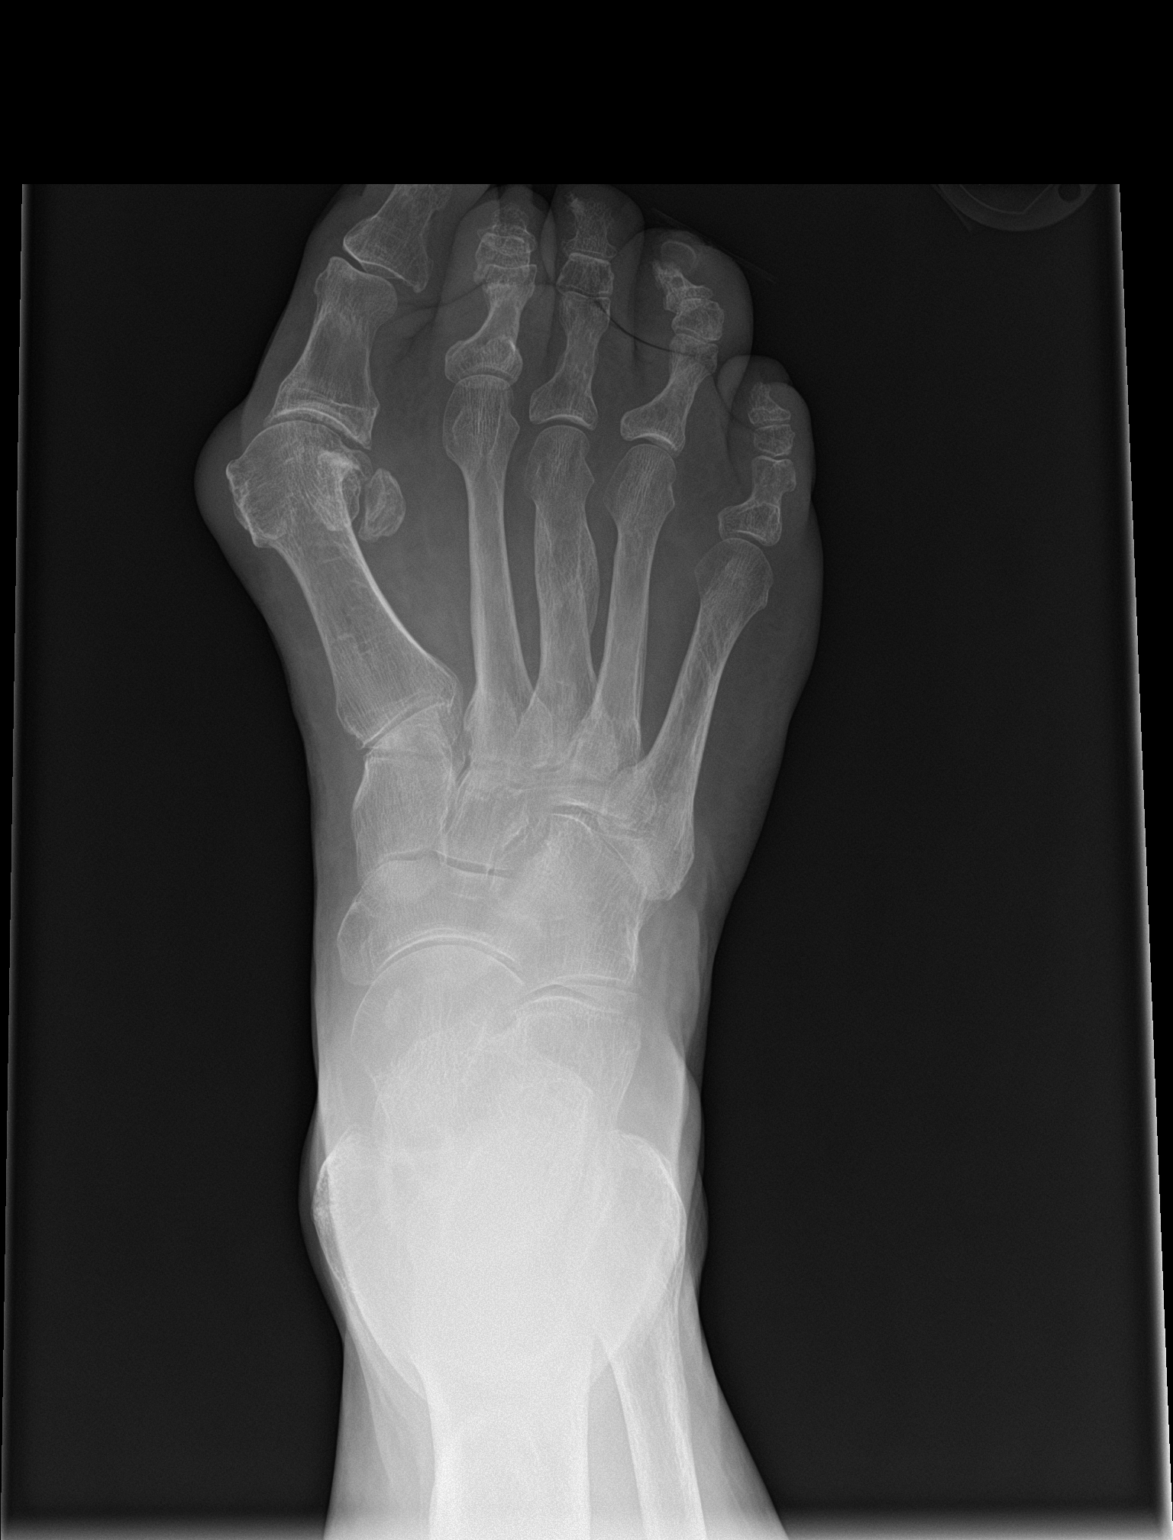

[foot obl]
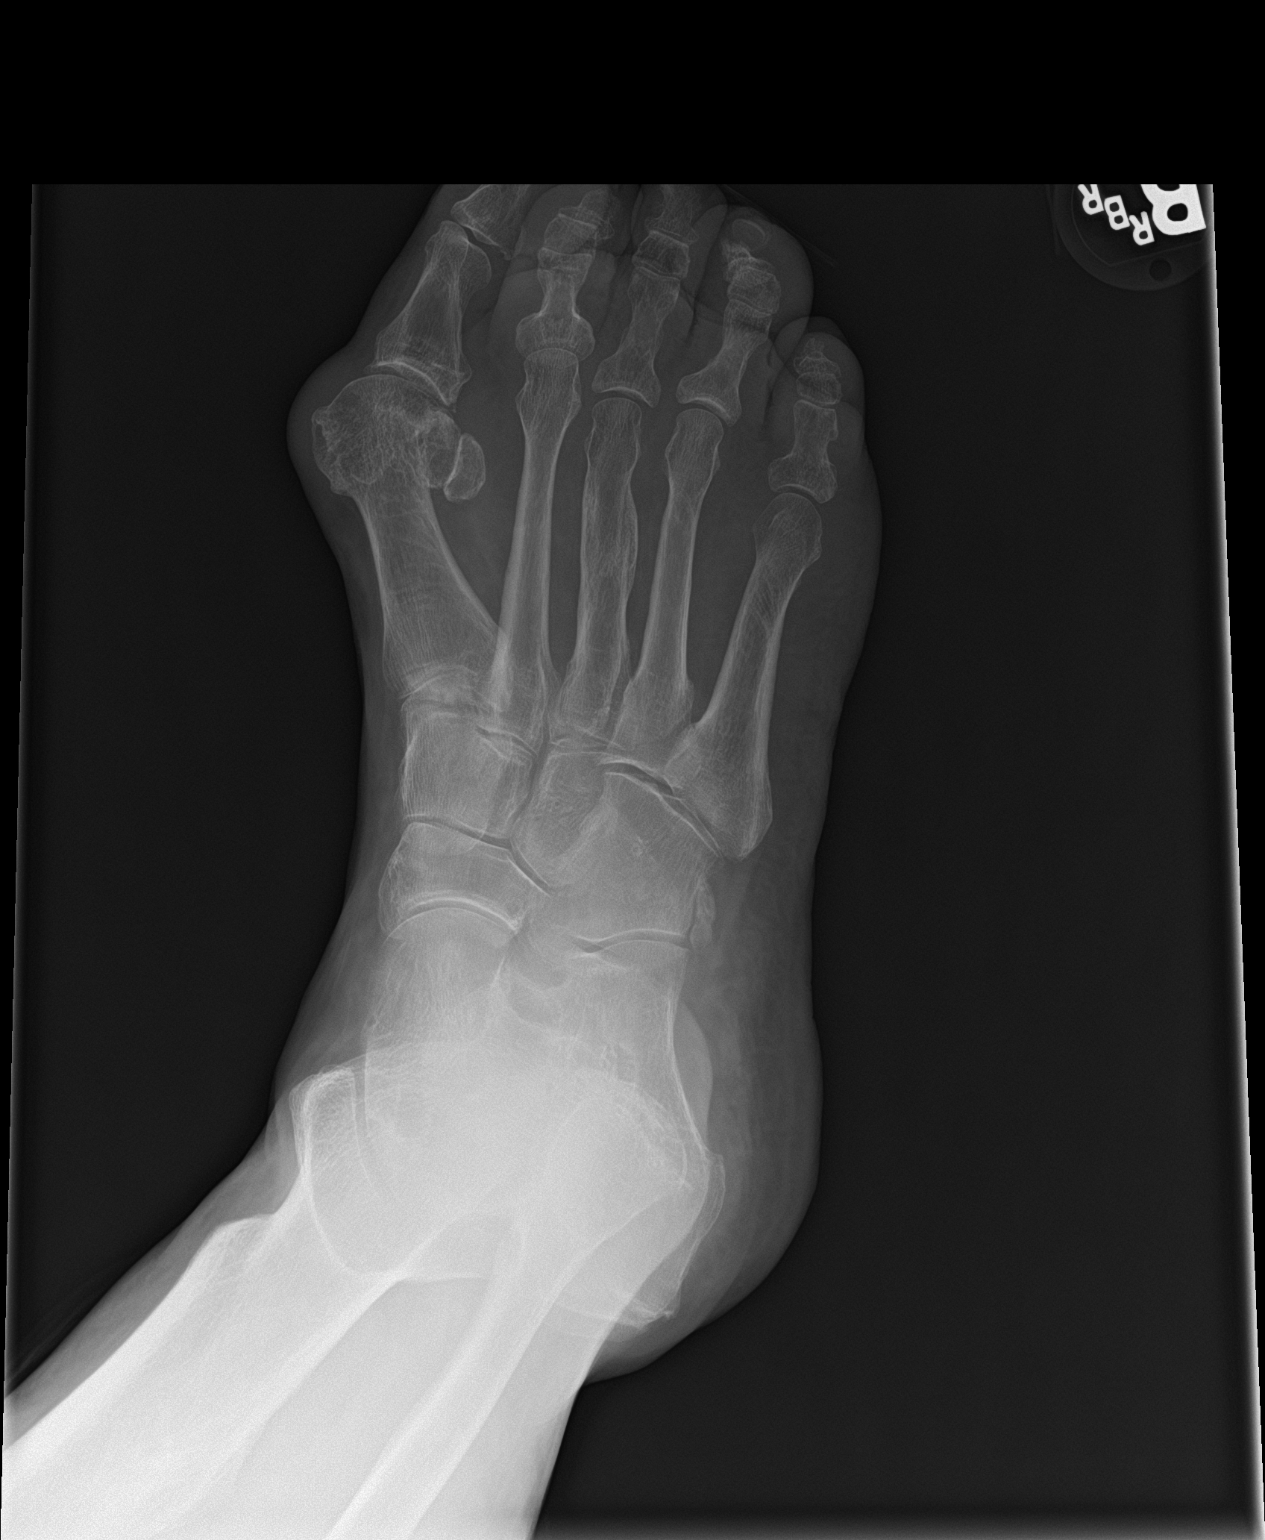

[foot lat]
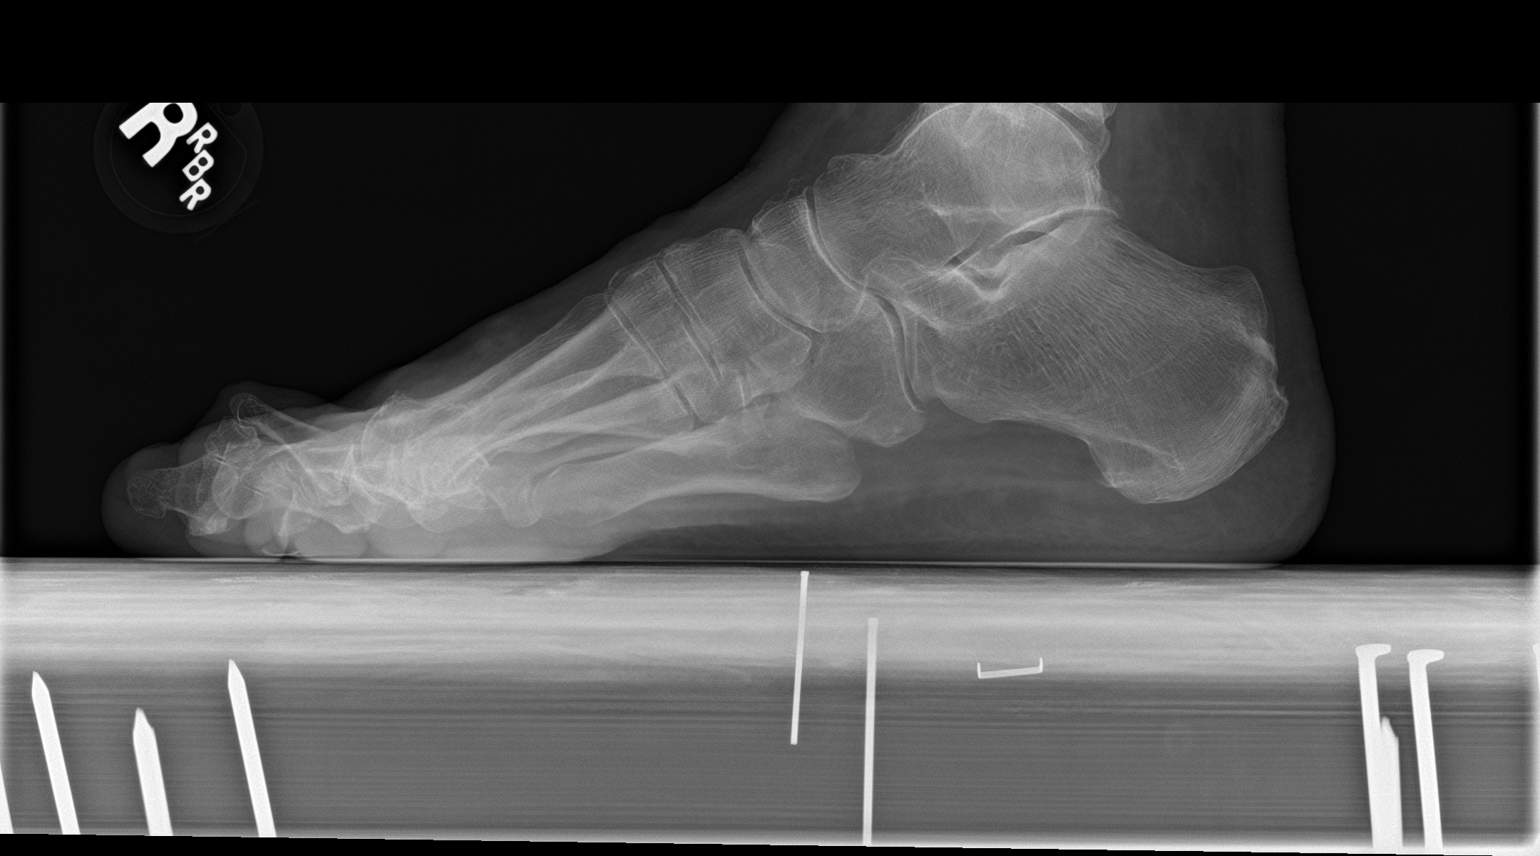

[3 of 3 positions shown; findings below may reference images not displayed]

FINDINGS: Soft tissues of the fourth toe appears severely swollen. No soft
tissue gas or radiopaque foreign body is identified. There appears
to be a defect in the cortex of the tuft of the fourth toe. Remote
healed third metatarsal fracture is identified. The patient is at a
severe hallux valgus deformity. There is first MTP osteoarthritis.
The the patient has a hammertoe deformity of the second toe.
IMPRESSION: Severe soft tissue swelling of the fourth toe with bony destructive
change in the tuft worrisome for osteomyelitis.

Severe hallux valgus.

Second hammertoe.

## 2017-05-12 DIAGNOSIS — R1084 Generalized abdominal pain: Secondary | ICD-10-CM | POA: Diagnosis not present

## 2017-05-12 DIAGNOSIS — K921 Melena: Secondary | ICD-10-CM | POA: Diagnosis not present

## 2017-05-12 DIAGNOSIS — K5790 Diverticulosis of intestine, part unspecified, without perforation or abscess without bleeding: Secondary | ICD-10-CM | POA: Diagnosis not present

## 2017-05-12 DIAGNOSIS — R109 Unspecified abdominal pain: Secondary | ICD-10-CM | POA: Diagnosis not present

## 2017-05-17 DIAGNOSIS — S46112A Strain of muscle, fascia and tendon of long head of biceps, left arm, initial encounter: Secondary | ICD-10-CM | POA: Diagnosis not present

## 2017-06-08 DIAGNOSIS — M4306 Spondylolysis, lumbar region: Secondary | ICD-10-CM | POA: Diagnosis not present

## 2017-06-20 DIAGNOSIS — M7542 Impingement syndrome of left shoulder: Secondary | ICD-10-CM | POA: Diagnosis not present

## 2017-06-20 DIAGNOSIS — M75112 Incomplete rotator cuff tear or rupture of left shoulder, not specified as traumatic: Secondary | ICD-10-CM | POA: Diagnosis not present

## 2017-06-20 DIAGNOSIS — M25512 Pain in left shoulder: Secondary | ICD-10-CM | POA: Diagnosis not present

## 2017-06-20 DIAGNOSIS — M19012 Primary osteoarthritis, left shoulder: Secondary | ICD-10-CM | POA: Diagnosis not present

## 2017-06-21 DIAGNOSIS — M19012 Primary osteoarthritis, left shoulder: Secondary | ICD-10-CM | POA: Insufficient documentation

## 2017-06-21 DIAGNOSIS — M75112 Incomplete rotator cuff tear or rupture of left shoulder, not specified as traumatic: Secondary | ICD-10-CM | POA: Insufficient documentation

## 2017-07-12 ENCOUNTER — Other Ambulatory Visit: Payer: Self-pay

## 2017-07-12 ENCOUNTER — Ambulatory Visit: Payer: Medicare HMO | Admitting: Podiatry

## 2017-07-12 ENCOUNTER — Encounter: Payer: Self-pay | Admitting: Podiatry

## 2017-07-12 DIAGNOSIS — M79675 Pain in left toe(s): Secondary | ICD-10-CM

## 2017-07-12 DIAGNOSIS — B351 Tinea unguium: Secondary | ICD-10-CM | POA: Diagnosis not present

## 2017-07-12 DIAGNOSIS — M79674 Pain in right toe(s): Secondary | ICD-10-CM

## 2017-07-13 NOTE — Progress Notes (Signed)
Subjective: 72 y.o. returns the office today for painful, elongated, thick toenails which she cannot trim herself. Denies any redness or drainage around the nails. She will trim her toenails herself at times. Denies any acute changes since last appointment and no new complaints today. Denies any systemic complaints such as fevers, chills, nausea, vomiting.   PCP: Shelda Pal, DO  Objective: AAO 3, NAD DP/PT pulses palpable, CRT less than 3 seconds Protective sensation decreased with Simms Weinstein monofilament, Achilles tendon reflex intact.  Nails hypertrophic, dystrophic, elongated, brittle, discolored 9. There is tenderness overlying the nails 1-5 bilaterally except the right 4th toe. There is no surrounding erythema or drainage along the nail sites. No open lesions or pre-ulcerative lesions are identified. No other areas of tenderness bilateral lower extremities. No overlying edema, erythema, increased warmth. No pain with calf compression, swelling, warmth, erythema.  Assessment: Patient presents with symptomatic onychomycosis  Plan: -Treatment options including alternatives, risks, complications were discussed -Nails sharply debrided 9 without complication/bleeding. -Discussed daily foot inspection. If there are any changes, to call the office immediately.  -Follow-up in 3 months or sooner if any problems are to arise. In the meantime, encouraged to call the office with any questions, concerns, changes symptoms.  Celesta Gentile, DPM

## 2017-08-03 DIAGNOSIS — M25551 Pain in right hip: Secondary | ICD-10-CM | POA: Diagnosis not present

## 2017-08-03 DIAGNOSIS — M1611 Unilateral primary osteoarthritis, right hip: Secondary | ICD-10-CM | POA: Diagnosis not present

## 2017-08-11 DIAGNOSIS — M75112 Incomplete rotator cuff tear or rupture of left shoulder, not specified as traumatic: Secondary | ICD-10-CM | POA: Diagnosis not present

## 2017-08-11 DIAGNOSIS — M7502 Adhesive capsulitis of left shoulder: Secondary | ICD-10-CM | POA: Diagnosis not present

## 2017-08-14 DIAGNOSIS — M7502 Adhesive capsulitis of left shoulder: Secondary | ICD-10-CM | POA: Insufficient documentation

## 2017-09-13 DIAGNOSIS — M75112 Incomplete rotator cuff tear or rupture of left shoulder, not specified as traumatic: Secondary | ICD-10-CM | POA: Diagnosis not present

## 2017-09-13 DIAGNOSIS — M7502 Adhesive capsulitis of left shoulder: Secondary | ICD-10-CM | POA: Diagnosis not present

## 2017-09-15 DIAGNOSIS — M7502 Adhesive capsulitis of left shoulder: Secondary | ICD-10-CM | POA: Diagnosis not present

## 2017-09-15 DIAGNOSIS — M75112 Incomplete rotator cuff tear or rupture of left shoulder, not specified as traumatic: Secondary | ICD-10-CM | POA: Diagnosis not present

## 2017-09-21 DIAGNOSIS — M75112 Incomplete rotator cuff tear or rupture of left shoulder, not specified as traumatic: Secondary | ICD-10-CM | POA: Diagnosis not present

## 2017-09-21 DIAGNOSIS — M7502 Adhesive capsulitis of left shoulder: Secondary | ICD-10-CM | POA: Diagnosis not present

## 2017-09-27 DIAGNOSIS — M75112 Incomplete rotator cuff tear or rupture of left shoulder, not specified as traumatic: Secondary | ICD-10-CM | POA: Diagnosis not present

## 2017-09-27 DIAGNOSIS — M7502 Adhesive capsulitis of left shoulder: Secondary | ICD-10-CM | POA: Diagnosis not present

## 2017-09-29 DIAGNOSIS — M7502 Adhesive capsulitis of left shoulder: Secondary | ICD-10-CM | POA: Diagnosis not present

## 2017-09-29 DIAGNOSIS — M75112 Incomplete rotator cuff tear or rupture of left shoulder, not specified as traumatic: Secondary | ICD-10-CM | POA: Diagnosis not present

## 2017-10-03 DIAGNOSIS — M7502 Adhesive capsulitis of left shoulder: Secondary | ICD-10-CM | POA: Diagnosis not present

## 2017-10-03 DIAGNOSIS — M75112 Incomplete rotator cuff tear or rupture of left shoulder, not specified as traumatic: Secondary | ICD-10-CM | POA: Diagnosis not present

## 2017-10-06 DIAGNOSIS — M7502 Adhesive capsulitis of left shoulder: Secondary | ICD-10-CM | POA: Diagnosis not present

## 2017-10-06 DIAGNOSIS — M75112 Incomplete rotator cuff tear or rupture of left shoulder, not specified as traumatic: Secondary | ICD-10-CM | POA: Diagnosis not present

## 2017-10-11 ENCOUNTER — Ambulatory Visit: Payer: Medicare HMO | Admitting: Podiatry

## 2017-10-11 ENCOUNTER — Encounter: Payer: Self-pay | Admitting: Podiatry

## 2017-10-11 DIAGNOSIS — M79675 Pain in left toe(s): Secondary | ICD-10-CM

## 2017-10-11 DIAGNOSIS — E1149 Type 2 diabetes mellitus with other diabetic neurological complication: Secondary | ICD-10-CM

## 2017-10-11 DIAGNOSIS — M79674 Pain in right toe(s): Secondary | ICD-10-CM

## 2017-10-11 DIAGNOSIS — Z89421 Acquired absence of other right toe(s): Secondary | ICD-10-CM

## 2017-10-11 DIAGNOSIS — B351 Tinea unguium: Secondary | ICD-10-CM | POA: Diagnosis not present

## 2017-10-12 NOTE — Progress Notes (Addendum)
Subjective: 72 y.o. returns the office today for painful, elongated, thick toenails which she cannot trim herself. Denies any redness or drainage around the nails. She will trim her toenails herself at times. Since last appointment she has been diagnosed with diabetes. She is asking for inserts in her shoes. Denies any acute changes since last appointment and no new complaints today. Denies any systemic complaints such as fevers, chills, nausea, vomiting.   PCP: Shelda Pal, DO  Objective: AAO 3, NAD DP/PT pulses palpable, CRT less than 3 seconds Protective sensation decreased with Simms Weinstein monofilament Nails hypertrophic, dystrophic, elongated, brittle, discolored 9. There is tenderness overlying the nails 1-5 bilaterally except the right 4th toe. There is no surrounding erythema or drainage along the nail sites. Hammertoes present  No open lesions or pre-ulcerative lesions are identified. No other areas of tenderness bilateral lower extremities. No overlying edema, erythema, increased warmth. No pain with calf compression, swelling, warmth, erythema.  Assessment: Patient presents with symptomatic onychomycosis  Plan: -Treatment options including alternatives, risks, complications were discussed -Nails sharply debrided 9 without complication/bleeding. -Given neuropathy and history of amputation I do think she will benefit from diabetic inserts. Paperwork was completed today for pre-certification.  -Discussed daily foot inspection. If there are any changes, to call the office immediately.  -Follow-up in 3 months or sooner if any problems are to arise. In the meantime, encouraged to call the office with any questions, concerns, changes symptoms.  Celesta Gentile, DPM

## 2017-11-07 ENCOUNTER — Ambulatory Visit (INDEPENDENT_AMBULATORY_CARE_PROVIDER_SITE_OTHER): Payer: Medicare HMO | Admitting: Medical

## 2017-11-07 ENCOUNTER — Other Ambulatory Visit: Payer: Self-pay | Admitting: Medical

## 2017-11-07 ENCOUNTER — Encounter: Payer: Self-pay | Admitting: Medical

## 2017-11-07 VITALS — BP 155/80 | HR 74 | Temp 98.4°F | Resp 16 | Ht 63.0 in | Wt 196.8 lb

## 2017-11-07 DIAGNOSIS — R05 Cough: Secondary | ICD-10-CM

## 2017-11-07 DIAGNOSIS — J309 Allergic rhinitis, unspecified: Secondary | ICD-10-CM

## 2017-11-07 DIAGNOSIS — R059 Cough, unspecified: Secondary | ICD-10-CM

## 2017-11-07 DIAGNOSIS — J40 Bronchitis, not specified as acute or chronic: Secondary | ICD-10-CM | POA: Diagnosis not present

## 2017-11-07 DIAGNOSIS — R067 Sneezing: Secondary | ICD-10-CM

## 2017-11-07 MED ORDER — BENZONATATE 100 MG PO CAPS: 100.0000 mg | ORAL_CAPSULE | Freq: Three times a day (TID) | ORAL | 0 refills | Status: DC | PRN

## 2017-11-07 MED ORDER — LEVOCETIRIZINE DIHYDROCHLORIDE 5 MG PO TABS: 5.0000 mg | ORAL_TABLET | Freq: Every evening | ORAL | 0 refills | Status: DC

## 2017-11-07 MED ORDER — METHYLPREDNISOLONE ACETATE 40 MG/ML IJ SUSP
40.0000 mg | Freq: Once | INTRAMUSCULAR | Status: AC
  Administered 2017-11-07: 40 mg via INTRAMUSCULAR

## 2017-11-07 MED ORDER — DOXYCYCLINE HYCLATE 100 MG PO TABS: 100.0000 mg | ORAL_TABLET | Freq: Two times a day (BID) | ORAL | 0 refills | Status: DC

## 2017-11-07 NOTE — Patient Instructions (Signed)
You have had some bronchitis-like signs and symptoms over the past month.  However you did not improve with the erythromycin that the [place] gave you.  Also on exam your lungs sound clear.  So I do think you may have allergic rhinitis type symptoms causing sneezing and a cough.  We gave you Depo-Medrol 40 mg IM injection.  Also prescribed Xyzal antihistamine and benzonatate.  I want to see how you do with the above treatment but if you do not improve significantly by Wednesday morning then I want you to start doxycycline antibiotic.  Rx advisement given.  Repeat chest x-ray may be beneficial if you have worsening or changing signs symptoms.  But not indicated today.  Follow-up in 7 to 10 days or as needed.

## 2017-11-07 NOTE — Progress Notes (Signed)
Subjective:    Patient ID: Kristina Patel, female    DOB: [birth year], 72 y.o.   MRN: [id number]  HPI  Pt in for evaluation.  Pt states she had cough for about 1 months. One month ago she had evaluation at [place]. She was given e-mycin, benzonatate and cough syrup. Slight scratchy throat.  No fever,no chills or sweats.  She has been sneezing. No wheezing.  Pt states more residual chest congestion. No hx of smoking.   Review of Systems  Constitutional: Negative for chills, fatigue and fever.  HENT: Positive for sinus pressure and sneezing. Negative for congestion, ear pain, postnasal drip, rhinorrhea and sinus pain.        States sneezing a lot.  Respiratory: Positive for cough. Negative for chest tightness, shortness of breath and wheezing.        Cough is not productive. No hx of smoking.  Cardiovascular: Negative for chest pain and palpitations.  Gastrointestinal: Negative for abdominal pain.  Endocrine: Negative for polydipsia and polyuria.  Genitourinary: Negative for dysuria and urgency.  Musculoskeletal: Negative for back pain and gait problem.  Neurological: Negative for dizziness, speech difficulty, weakness, numbness and headaches.  Hematological: Negative for adenopathy. Does not bruise/bleed easily.  Psychiatric/Behavioral: Negative for behavioral problems, confusion and sleep disturbance. The patient is not nervous/anxious.    Past Medical History:  Diagnosis Date  . Bacterial overgrowth syndrome   . Breast mass   . Carpal tunnel syndrome   . Colon polyps   . Degenerative disc disease   . Depression   . Fall   . GERD (gastroesophageal reflux disease)   . Gouty arthropathy   . Headache   . Hyperlipidemia   . Hypertension   . IBS (irritable bowel syndrome)    constipation  . Internal prolapsed hemorrhoids   . Iron deficiency anemia   . Melanoma ([place])   . Osteoarthritis   . Osteoporosis   . Ovarian cancer ([place])   . Pelvic floor dysfunction 05/05/2016  .  Peripheral neuropathy   . Prediabetes   . Pulmonary nodules   . Renal disease   . Rheumatoid arthritis(714.0)   . Squamous cell carcinoma skin of abdomen   . Uterine cancer ([place])   . Vitamin D deficiency      Social History   Socioeconomic History  . Marital status: Married    Spouse name: [name]  . Number of children: 2  . Years of education: 22  . Highest education level: Not on file  Occupational History  . Occupation: disabled  Social Needs  . Financial resource strain: Not on file  . Food insecurity:    Worry: Not on file    Inability: Not on file  . Transportation needs:    Medical: Not on file    Non-medical: Not on file  Tobacco Use  . Smoking status: Never Smoker  . Smokeless tobacco: Never Used  Substance and Sexual Activity  . Alcohol use: No  . Drug use: No  . Sexual activity: Not Currently  Lifestyle  . Physical activity:    Days per week: Not on file    Minutes per session: Not on file  . Stress: Not on file  Relationships  . Social connections:    Talks on phone: Not on file    Gets together: Not on file    Attends religious service: Not on file    Active member of club or organization: Not on file    Attends meetings of clubs  or organizations: Not on file    Relationship status: Not on file  . Intimate partner violence:    Fear of current or ex partner: Not on file    Emotionally abused: Not on file    Physically abused: Not on file    Forced sexual activity: Not on file  Other Topics Concern  . Not on file  Social History Narrative   Patient is married [name]) and lives with her husband.   Patient has two children.   Patient is retired.   Patient has a college education.   Patient is right handed.   Patient drinks 3-4 cups of coffee and tea daily.   [employer]    Past Surgical History:  Procedure Laterality Date  . ABDOMINAL HYSTERECTOMY     partial  . ANAL RECTAL MANOMETRY N/A 04/30/2016   Procedure: ANO RECTAL MANOMETRY;  Surgeon:  Mauri Pole, MD;  Location: [facility];  Service: Endoscopy;  Laterality: N/A;  . APPENDECTOMY    . BREAST SURGERY Bilateral    benign fatty tumor removed  . CARPAL TUNNEL RELEASE Right   . CERVICAL SPINE SURGERY    . CHOLECYSTECTOMY    . COLONOSCOPY    . KNEE ARTHROPLASTY Right   . LUMBAR DISC SURGERY    . MASTECTOMY, PARTIAL    . MELANOMA EXCISION    . OPERATIVE HYSTEROSCOPY    . TONSILLECTOMY      Family History  Problem Relation Age of Onset  . Alzheimer's disease Mother   . Colon cancer Mother 42  . Lung cancer Mother   . Dementia Mother   . Melanoma Father   . Non-Hodgkin's lymphoma Father   . Heart disease Maternal Grandmother   . Colon cancer Maternal Grandfather   . Colon cancer Paternal Grandmother   . Colon cancer Paternal Grandfather     Allergies  Allergen Reactions  . Butalbital-Asa-Caff-Codeine [Fiorinal-Codeine] Anaphylaxis and Swelling  . Darvon Other (See Comments)    Irritable & hyper  . Ambien [Zolpidem Tartrate] Other (See Comments)    Sleep walking  . Amitriptyline Other (See Comments)  . Ativan [Lorazepam] Other (See Comments)    Adverse reaction...made patient "act out"  . Bactrim [Sulfamethoxazole-Trimethoprim]   . Ciprofloxacin Hcl     nausea  . Fiorinal [Butalbital-Aspirin-Caffeine]   . Penicillins   . Propoxyphene Other (See Comments)    unknown  . Tape Other (See Comments)    Allergic to plastic tape. The adhesive causes redness to skin as well as loss of skin.  . Benzodiazepines Cough    Current Outpatient Medications on File Prior to Visit  Medication Sig Dispense Refill  . ACETAMINOPHEN PO Take by mouth.    . ACIDOPHILUS LACTOBACILLUS PO Take by mouth.    . ALPRAZolam (XANAX) 0.5 MG tablet Take 0.5 mg by mouth every 12 (twelve) hours as needed. For anxiety    . Aspirin Effervescent (ALKA-SELTZER ORIGINAL PO) Take by mouth.    . Aspirin-Acetaminophen-Caffeine (EXCEDRIN PO) Take by mouth.    Marland Kitchen aspirin-sod bicarb-citric  acid (ALKA-SELTZER) 325 MG TBEF tablet Take 325 mg by mouth every 6 (six) hours as needed.    . Biotin 1000 MCG tablet Take 1,000 mcg by mouth 2 (two) times daily as needed.      . Cholecalciferol (VITAMIN D3) 2000 UNITS capsule Take 1,000 Units by mouth daily.    . clindamycin (CLEOCIN) 300 MG capsule Take 1 capsule (300 mg total) by mouth 3 (three) times daily. 30 capsule  0  . cyanocobalamin (,VITAMIN B-12,) 1000 MCG/ML injection Inject 1,000 mcg into the muscle every 30 (thirty) days.    . cyclobenzaprine (FLEXERIL) 10 MG tablet Take 10 mg by mouth 2 (two) times daily.    Marland Kitchen denosumab (PROLIA) 60 MG/ML SOSY injection Inject 60 mg into the skin every 6 (six) months.    . Flaxseed, Linseed, (FLAXSEED OIL) 1000 MG CAPS Take 1,000 mg by mouth 4 (four) times daily.     . folic acid (FOLVITE) 1 MG tablet Take 1 mg by mouth.    . lidocaine (LIDODERM) 5 % Place 1 patch onto the skin daily as needed. FOR PAIN.    Remove & Discard patch within 12 hours or as directed by MD    . losartan (COZAAR) 25 MG tablet Take 25 mg by mouth daily.    . metoprolol (LOPRESSOR) 50 MG tablet Take 1 tablet (50 mg total) by mouth 2 (two) times daily. 60 tablet 2  . nitroGLYCERIN (NITROGLYN) 2 % ointment Apply topically 4 (four) times daily.    Marland Kitchen omeprazole (PRILOSEC) 10 MG capsule Take 20 mg by mouth 2 (two) times daily before a meal.     . promethazine (PHENERGAN) 25 MG tablet Take by mouth.    . pseudoephedrine (SUDAFED) 30 MG tablet Take 30 mg by mouth.    . riTUXimab (RITUXAN IV) Inject into the vein.    Marland Kitchen topiramate (TOPAMAX) 25 MG tablet Take 2 tablets (50 mg total) by mouth 2 (two) times daily. 120 tablet 3  . Turmeric (RA TURMERIC) 500 MG CAPS Take by mouth.    Marland Kitchen UNABLE TO FIND Pt takes probiotics - 2 tablets three times a day    . Wheat Dextrin (BENEFIBER PO) Take by mouth.     No current facility-administered medications on file prior to visit.     BP (!) 170/86   Pulse 74   Temp 98.4 F (36.9 C) (Oral)    Resp 16   Ht 5\' 3"  (1.6 m)   Wt 196 lb 12.8 oz (89.3 kg)   SpO2 99%   BMI 34.86 kg/m      Objective:   Physical Exam  General  Mental Status - Alert. General Appearance - Well groomed. Not in acute distress.  Skin Rashes- No Rashes.  HEENT Head- Normal. Ear Auditory Canal - Left- Normal. Right - Normal.Tympanic Membrane- Left- Normal. Right- Normal. Eye Sclera/Conjunctiva- Left- Normal. Right- Normal. Nose & Sinuses Nasal Mucosa- Left-  Boggy and Congested. Right-  Boggy and  Congested.Bilateral no maxillary and no  frontal sinus pressure. Mouth & Throat Lips: Upper Lip- Normal: no dryness, cracking, pallor, cyanosis, or vesicular eruption. Lower Lip-Normal: no dryness, cracking, pallor, cyanosis or vesicular eruption. Buccal Mucosa- Bilateral- No Aphthous ulcers. Oropharynx- No Discharge or Erythema. +pnd. Tonsils: Characteristics- Bilateral- No Erythema or Congestion. Size/Enlargement- Bilateral- No enlargement. Discharge- bilateral-None.  Neck Neck- Supple. No Masses.   Chest and Lung Exam Auscultation: Breath Sounds:-Clear even and unlabored.  Cardiovascular Auscultation:Rythm- Regular, rate and rhythm. Murmurs & Other Heart Sounds:Ausculatation of the heart reveal- No Murmurs.  Lymphatic Head & Neck General Head & Neck Lymphatics: Bilateral: Description- No Localized lymphadenopathy.  Lower ext- no pedal edema. Negative homans signs bilaterally       Assessment & Plan:  You have had some bronchitis-like signs and symptoms over the past month.  However you did not improve with the erythromycin that the [place] gave you.  Also on exam your lungs sound clear.  So I  do think you may have allergic rhinitis type symptoms causing sneezing and a cough.  We gave you Depo-Medrol 40 mg IM injection.  Also prescribed Xyzal antihistamine and benzonatate.  I want to see how you do with the above treatment but if you do not improve significantly by Wednesday morning then I  want you to start doxycycline antibiotic.  Rx advisement given.  Repeat chest x-ray may be beneficial if you have worsening or changing signs symptoms.  But not indicated today.  Follow-up in 7 to 10 days or as needed.

## 2017-11-11 DIAGNOSIS — M4306 Spondylolysis, lumbar region: Secondary | ICD-10-CM | POA: Diagnosis not present

## 2017-11-17 ENCOUNTER — Ambulatory Visit (HOSPITAL_BASED_OUTPATIENT_CLINIC_OR_DEPARTMENT_OTHER)
Admission: RE | Admit: 2017-11-17 | Discharge: 2017-11-17 | Disposition: A | Discharge status: Home / Self Care | Payer: Medicare HMO | Source: Ambulatory Visit | Attending: Family Medicine | Admitting: Family Medicine

## 2017-11-17 ENCOUNTER — Ambulatory Visit (INDEPENDENT_AMBULATORY_CARE_PROVIDER_SITE_OTHER): Payer: Medicare HMO | Admitting: Family Medicine

## 2017-11-17 ENCOUNTER — Encounter: Payer: Self-pay | Admitting: Family Medicine

## 2017-11-17 VITALS — BP 160/90 | HR 79 | Temp 98.5°F | Wt 196.0 lb

## 2017-11-17 DIAGNOSIS — J0100 Acute maxillary sinusitis, unspecified: Secondary | ICD-10-CM | POA: Diagnosis not present

## 2017-11-17 DIAGNOSIS — I10 Essential (primary) hypertension: Secondary | ICD-10-CM | POA: Diagnosis not present

## 2017-11-17 DIAGNOSIS — Z9109 Other allergy status, other than to drugs and biological substances: Secondary | ICD-10-CM | POA: Diagnosis not present

## 2017-11-17 DIAGNOSIS — R918 Other nonspecific abnormal finding of lung field: Secondary | ICD-10-CM | POA: Diagnosis not present

## 2017-11-17 DIAGNOSIS — R059 Cough, unspecified: Secondary | ICD-10-CM

## 2017-11-17 DIAGNOSIS — R05 Cough: Secondary | ICD-10-CM | POA: Insufficient documentation

## 2017-11-17 MED ORDER — FLUTICASONE PROPIONATE 50 MCG/ACT NA SUSP: 2.0000 | Freq: Every day | NASAL | 2 refills | Status: DC

## 2017-11-17 MED ORDER — VALSARTAN 160 MG PO TABS: 160.0000 mg | ORAL_TABLET | Freq: Every day | ORAL | 2 refills | Status: DC

## 2017-11-17 MED ORDER — MONTELUKAST SODIUM 10 MG PO TABS: 10.0000 mg | ORAL_TABLET | Freq: Every day | ORAL | 3 refills | Status: DC

## 2017-11-17 NOTE — Progress Notes (Signed)
Chief Complaint  Patient presents with  . Cough    c/o prod cough with clear mucus.     Lynnell Catalan here for URI complaints.  Duration: 1 month  Associated symptoms: cough, itchy throat, stuffy nose, associated achiness Denies: sinus pain, itchy watery eyes, ear pain, ear drainage, sore throat and fevers Treatment to date: erythromycin, doxycycline, Tessalon Perles, tussin syrup, Xyzal Sick contacts: No   Hypertension Patient presents for hypertension follow up. She has not been taking her Losartan after the recall for the contaminated supply. Patient has these side effects of medication: none She is sometimes adhering to a healthy diet overall. Exercise: tries to walk, RA limits her   ROS:  Const: Denies fevers HEENT: As noted in HPI Lungs: No SOB  Past Medical History:  Diagnosis Date  . Bacterial overgrowth syndrome   . Breast mass   . Carpal tunnel syndrome   . Colon polyps   . Degenerative disc disease   . Depression   . Fall   . GERD (gastroesophageal reflux disease)   . Gouty arthropathy   . Headache   . Hyperlipidemia   . Hypertension   . IBS (irritable bowel syndrome)    constipation  . Internal prolapsed hemorrhoids   . Iron deficiency anemia   . Melanoma ([place])   . Osteoarthritis   . Osteoporosis   . Ovarian cancer ([place])   . Pelvic floor dysfunction 05/05/2016  . Peripheral neuropathy   . Prediabetes   . Pulmonary nodules   . Renal disease   . Rheumatoid arthritis(714.0)   . Squamous cell carcinoma skin of abdomen   . Uterine cancer ([place])   . Vitamin D deficiency     BP (!) 160/90 (BP Location: Right Arm, Patient Position: Sitting, Cuff Size: Normal)   Pulse 79   Temp 98.5 F (36.9 C) (Oral)   Wt 196 lb (88.9 kg)   SpO2 99%   BMI 34.72 kg/m  General: Awake, alert, appears stated age HEENT: AT, [place], ears patent b/l and TM's neg, nares patent w/o discharge, pharynx pink and without exudates, MMM Neck: No masses or asymmetry Heart:  RRR Lungs: CTAB, no accessory muscle use Psych: Age appropriate judgment and insight, normal mood and affect  Subacute maxillary sinusitis  Cough  Environmental allergies - Plan: montelukast (SINGULAIR) 10 MG tablet, fluticasone (FLONASE) 50 MCG/ACT nasal spray, DG Chest 2 View  Essential hypertension - Plan: valsartan (DIOVAN) 160 MG tablet  Orders as above. #1- same as #3, add LTi and INCS to PO antihist; will consider Augmentin if no improvement by next week. #2- ck CXR #4 change ARBs, recheck in 1 mo. Continue to push fluids, practice good hand hygiene, cover mouth when coughing. Change Losartan to Valsartan and will make slight increase in dose. F/u in 1 mo for HTN ck. If starting to experience fevers, shaking, or shortness of breath, seek immediate care. Pt voiced understanding and agreement to the plan.  [place], DO 11/17/17 3:37 PM

## 2017-11-17 NOTE — Patient Instructions (Signed)
We will be in touch regarding your X-ray results.  If you are not getting improvement by Monday, send me a message.  Continue to push fluids, practice good hand hygiene, and cover your mouth if you cough.  If you start having fevers, shaking or shortness of breath, seek immediate care.  Let us know if you need anything.

## 2017-11-20 ENCOUNTER — Encounter: Payer: Self-pay | Admitting: Family Medicine

## 2017-11-21 ENCOUNTER — Other Ambulatory Visit: Payer: Self-pay | Admitting: Family Medicine

## 2017-11-21 MED ORDER — PROMETHAZINE-DM 6.25-15 MG/5ML PO SYRP: 2.5000 mL | ORAL_SOLUTION | Freq: Four times a day (QID) | ORAL | 0 refills | Status: DC | PRN

## 2017-11-28 ENCOUNTER — Other Ambulatory Visit: Payer: Self-pay

## 2017-11-28 NOTE — Telephone Encounter (Signed)
Have her come in today please

## 2017-12-13 ENCOUNTER — Ambulatory Visit: Payer: Medicare HMO | Admitting: Podiatry

## 2017-12-16 ENCOUNTER — Other Ambulatory Visit: Payer: Self-pay | Admitting: Neurosurgery

## 2017-12-16 DIAGNOSIS — M4306 Spondylolysis, lumbar region: Secondary | ICD-10-CM

## 2017-12-22 ENCOUNTER — Ambulatory Visit: Payer: Medicare HMO | Admitting: Podiatry

## 2017-12-24 ENCOUNTER — Ambulatory Visit
Admission: RE | Admit: 2017-12-24 | Discharge: 2017-12-24 | Disposition: A | Discharge status: Home / Self Care | Payer: Medicare HMO | Source: Ambulatory Visit | Attending: Neurosurgery | Admitting: Neurosurgery

## 2017-12-24 DIAGNOSIS — M47819 Spondylosis without myelopathy or radiculopathy, site unspecified: Secondary | ICD-10-CM | POA: Diagnosis not present

## 2017-12-24 DIAGNOSIS — M4306 Spondylolysis, lumbar region: Secondary | ICD-10-CM

## 2017-12-26 ENCOUNTER — Ambulatory Visit: Payer: Medicare HMO | Admitting: Podiatry

## 2017-12-29 ENCOUNTER — Ambulatory Visit: Payer: Self-pay | Admitting: Family Medicine

## 2018-01-12 DIAGNOSIS — Z885 Allergy status to narcotic agent status: Secondary | ICD-10-CM | POA: Diagnosis not present

## 2018-01-12 DIAGNOSIS — M9971 Connective tissue and disc stenosis of intervertebral foramina of cervical region: Secondary | ICD-10-CM | POA: Diagnosis not present

## 2018-01-12 DIAGNOSIS — R42 Dizziness and giddiness: Secondary | ICD-10-CM | POA: Diagnosis not present

## 2018-01-12 DIAGNOSIS — M47892 Other spondylosis, cervical region: Secondary | ICD-10-CM | POA: Diagnosis not present

## 2018-01-12 DIAGNOSIS — Z888 Allergy status to other drugs, medicaments and biological substances status: Secondary | ICD-10-CM | POA: Diagnosis not present

## 2018-01-12 DIAGNOSIS — M50321 Other cervical disc degeneration at C4-C5 level: Secondary | ICD-10-CM | POA: Diagnosis not present

## 2018-01-12 DIAGNOSIS — Z87892 Personal history of anaphylaxis: Secondary | ICD-10-CM | POA: Diagnosis not present

## 2018-01-12 DIAGNOSIS — Z85820 Personal history of malignant melanoma of skin: Secondary | ICD-10-CM | POA: Diagnosis not present

## 2018-01-12 DIAGNOSIS — Z79899 Other long term (current) drug therapy: Secondary | ICD-10-CM | POA: Diagnosis not present

## 2018-01-12 DIAGNOSIS — Z880 Allergy status to penicillin: Secondary | ICD-10-CM | POA: Diagnosis not present

## 2018-01-12 DIAGNOSIS — G8194 Hemiplegia, unspecified affecting left nondominant side: Secondary | ICD-10-CM | POA: Diagnosis not present

## 2018-01-12 DIAGNOSIS — R200 Anesthesia of skin: Secondary | ICD-10-CM | POA: Diagnosis not present

## 2018-01-13 ENCOUNTER — Encounter (HOSPITAL_COMMUNITY): Payer: Self-pay | Admitting: Emergency Medicine

## 2018-01-13 ENCOUNTER — Emergency Department (HOSPITAL_COMMUNITY)
Admission: EM | Admit: 2018-01-13 | Discharge: 2018-01-13 | Disposition: A | Discharge status: Home / Self Care | Payer: No Typology Code available for payment source | Attending: Emergency Medicine | Admitting: Emergency Medicine

## 2018-01-13 ENCOUNTER — Other Ambulatory Visit: Payer: Self-pay

## 2018-01-13 ENCOUNTER — Emergency Department (HOSPITAL_COMMUNITY): Payer: No Typology Code available for payment source

## 2018-01-13 DIAGNOSIS — R200 Anesthesia of skin: Secondary | ICD-10-CM | POA: Diagnosis not present

## 2018-01-13 DIAGNOSIS — I10 Essential (primary) hypertension: Secondary | ICD-10-CM | POA: Diagnosis not present

## 2018-01-13 DIAGNOSIS — R42 Dizziness and giddiness: Secondary | ICD-10-CM | POA: Diagnosis not present

## 2018-01-13 DIAGNOSIS — Z79899 Other long term (current) drug therapy: Secondary | ICD-10-CM | POA: Insufficient documentation

## 2018-01-13 DIAGNOSIS — M50321 Other cervical disc degeneration at C4-C5 level: Secondary | ICD-10-CM | POA: Diagnosis not present

## 2018-01-13 DIAGNOSIS — M9971 Connective tissue and disc stenosis of intervertebral foramina of cervical region: Secondary | ICD-10-CM | POA: Diagnosis not present

## 2018-01-13 DIAGNOSIS — Z7982 Long term (current) use of aspirin: Secondary | ICD-10-CM | POA: Diagnosis not present

## 2018-01-13 DIAGNOSIS — G8194 Hemiplegia, unspecified affecting left nondominant side: Secondary | ICD-10-CM | POA: Insufficient documentation

## 2018-01-13 DIAGNOSIS — N390 Urinary tract infection, site not specified: Secondary | ICD-10-CM | POA: Diagnosis not present

## 2018-01-13 DIAGNOSIS — M47892 Other spondylosis, cervical region: Secondary | ICD-10-CM | POA: Diagnosis not present

## 2018-01-13 DIAGNOSIS — R51 Headache: Secondary | ICD-10-CM | POA: Diagnosis present

## 2018-01-13 LAB — URINALYSIS, ROUTINE W REFLEX MICROSCOPIC
Bilirubin Urine: NEGATIVE
GLUCOSE, UA: NEGATIVE mg/dL
Hgb urine dipstick: NEGATIVE
Ketones, ur: NEGATIVE mg/dL
Nitrite: NEGATIVE
PROTEIN: NEGATIVE mg/dL
Specific Gravity, Urine: 1.009 (ref 1.005–1.030)
pH: 7 (ref 5.0–8.0)

## 2018-01-13 LAB — CBC WITH DIFFERENTIAL/PLATELET
Abs Immature Granulocytes: 0.08 10*3/uL — ABNORMAL HIGH (ref 0.00–0.07)
Basophils Absolute: 0 10*3/uL (ref 0.0–0.1)
Basophils Relative: 0 %
EOS PCT: 0 %
Eosinophils Absolute: 0 10*3/uL (ref 0.0–0.5)
HEMATOCRIT: 38.9 % (ref 36.0–46.0)
Hemoglobin: 12.4 g/dL (ref 12.0–15.0)
Immature Granulocytes: 1 %
LYMPHS ABS: 1.2 10*3/uL (ref 0.7–4.0)
LYMPHS PCT: 11 %
MCH: 28.2 pg (ref 26.0–34.0)
MCHC: 31.9 g/dL (ref 30.0–36.0)
MCV: 88.6 fL (ref 80.0–100.0)
MONO ABS: 0.8 10*3/uL (ref 0.1–1.0)
MONOS PCT: 8 %
Neutro Abs: 8.9 10*3/uL — ABNORMAL HIGH (ref 1.7–7.7)
Neutrophils Relative %: 80 %
Platelets: 278 10*3/uL (ref 150–400)
RBC: 4.39 MIL/uL (ref 3.87–5.11)
RDW: 13.2 % (ref 11.5–15.5)
WBC: 11 10*3/uL — AB (ref 4.0–10.5)
nRBC: 0 % (ref 0.0–0.2)

## 2018-01-13 LAB — I-STAT CHEM 8, ED
BUN: 11 mg/dL (ref 8–23)
CALCIUM ION: 1.2 mmol/L (ref 1.15–1.40)
Chloride: 114 mmol/L — ABNORMAL HIGH (ref 98–111)
Creatinine, Ser: 0.9 mg/dL (ref 0.44–1.00)
Glucose, Bld: 219 mg/dL — ABNORMAL HIGH (ref 70–99)
HCT: 37 % (ref 36.0–46.0)
HEMOGLOBIN: 12.6 g/dL (ref 12.0–15.0)
Potassium: 3.9 mmol/L (ref 3.5–5.1)
Sodium: 140 mmol/L (ref 135–145)
TCO2: 18 mmol/L — AB (ref 22–32)

## 2018-01-13 MED ORDER — SODIUM CHLORIDE 0.9 % IV SOLN
1.0000 g | Freq: Once | INTRAVENOUS | Status: AC
  Administered 2018-01-13: 1 g via INTRAVENOUS
  Filled 2018-01-13: qty 10

## 2018-01-13 MED ORDER — DIPHENHYDRAMINE HCL 50 MG/ML IJ SOLN
25.0000 mg | Freq: Once | INTRAMUSCULAR | Status: AC
  Administered 2018-01-13: 25 mg via INTRAVENOUS
  Filled 2018-01-13: qty 1

## 2018-01-13 MED ORDER — DIAZEPAM 5 MG/ML IJ SOLN
5.0000 mg | Freq: Once | INTRAMUSCULAR | Status: AC
  Administered 2018-01-13: 5 mg via INTRAVENOUS
  Filled 2018-01-13: qty 2

## 2018-01-13 MED ORDER — KETOROLAC TROMETHAMINE 30 MG/ML IJ SOLN
30.0000 mg | Freq: Once | INTRAMUSCULAR | Status: AC
  Administered 2018-01-13: 30 mg via INTRAVENOUS
  Filled 2018-01-13: qty 1

## 2018-01-13 MED ORDER — HYDROXYZINE HCL 25 MG PO TABS
25.0000 mg | ORAL_TABLET | Freq: Once | ORAL | Status: AC
  Administered 2018-01-13: 25 mg via ORAL
  Filled 2018-01-13: qty 1

## 2018-01-13 MED ORDER — CEPHALEXIN 500 MG PO CAPS: 500.0000 mg | ORAL_CAPSULE | Freq: Three times a day (TID) | ORAL | 0 refills | Status: DC

## 2018-01-13 MED ORDER — GADOBUTROL 1 MMOL/ML IV SOLN
9.0000 mL | Freq: Once | INTRAVENOUS | Status: AC | PRN
  Administered 2018-01-13: 9 mL via INTRAVENOUS

## 2018-01-13 MED ORDER — METOCLOPRAMIDE HCL 5 MG/ML IJ SOLN
10.0000 mg | Freq: Once | INTRAMUSCULAR | Status: AC
  Administered 2018-01-13: 10 mg via INTRAVENOUS
  Filled 2018-01-13 (×2): qty 2

## 2018-01-13 NOTE — Discharge Instructions (Signed)
You have been evaluated for your numbness and weakness.  This may be due to a complicated migraine.  However, please call and follow up with neurology outpatient for further evaluation.  You may need a procedure called EMG for further assessment of your numbness and weakness.  You have been diagnosed with a urinary tract infection, take antibiotic as prescribed, follow up with your doctor.  Return if you have any concerns.

## 2018-01-13 NOTE — ED Notes (Signed)
Patient transported to MRI 

## 2018-01-13 NOTE — ED Triage Notes (Signed)
Pt was seen yesterday by her provider for dizziness, headache, neck pain and blurred vision, and tingling in her arms/lips/face x several days. Pt had a MRI head neck w/o contrast and was sent for possible further evaluation with MRI with contrast.

## 2018-01-13 NOTE — ED Provider Notes (Signed)
[facility] Provider Note   CSN: [id number] Arrival date & time: 01/13/18  1530     History   Chief Complaint Chief Complaint  Patient presents with  . Dizziness  . Headache    HPI Kristina Patel is a 72 y.o. female.  The history is provided by the patient and medical records. No language interpreter was used.  Dizziness  Associated symptoms: headaches   Headache       72 year old female with history of ovarian cancer, peripheral neuropathy, hypertension, hyperlipidemia presenting for evaluation of headache and dizziness.  Patient has been experiencing dizziness ongoing for the past 3 days.  She also report having headache, blurry vision, as well as left arm and leg weakness and numbness.  Headache is a throbbing sensation to the left side of the head and left neck.  She report double vision involving her left eye, having weakness and numbness to her left arm and leg.  Symptom is persistent, moderate in severity.  No associated fever, confusion, chest pain, trouble breathing, abdominal pain or back pain or dysuria.  She has never had the symptoms before.  She was seen at an outside hospital for her complaint.  She had had a neck MRI that was unremarkable.  She was given prednisone.  Symptoms did not improve, her PCP discussed with neurologist and suggest patient to come to the ER to have pain and neck MRI with contrast for further evaluation.  Past Medical History:  Diagnosis Date  . Bacterial overgrowth syndrome   . Breast mass   . Carpal tunnel syndrome   . Colon polyps   . Degenerative disc disease   . Depression   . Fall   . GERD (gastroesophageal reflux disease)   . Gouty arthropathy   . Headache   . Hyperlipidemia   . Hypertension   . IBS (irritable bowel syndrome)    constipation  . Internal prolapsed hemorrhoids   . Iron deficiency anemia   . Melanoma ([place])   . Osteoarthritis   . Osteoporosis   . Ovarian cancer ([place])    . Pelvic floor dysfunction 05/05/2016  . Peripheral neuropathy   . Prediabetes   . Pulmonary nodules   . Renal disease   . Rheumatoid arthritis(714.0)   . Squamous cell carcinoma skin of abdomen   . Uterine cancer ([place])   . Vitamin D deficiency     Patient Active Problem List   Diagnosis Date Noted  . Lumbar spondylolysis 03/16/2017  . Cervical spondylosis with myelopathy 03/16/2017  . Chronic constipation 06/28/2016  . Anemia 06/28/2016  . Pelvic floor dysfunction 05/05/2016  . Incontinence of feces   . Mild single current episode of major depressive disorder ([place]) 04/14/2016  . Mild cognitive impairment 04/14/2016  . Hx of colonic polyps 04/02/2016  . Family history of colon cancer in mother 04/02/2016  . Cervicogenic headache 04/07/2015  . Tension headache 09/12/2014  . Drug-induced polyneuropathy ([place]) 09/12/2014  . Paresthesias 03/26/2013  . Peripheral vascular disease, unspecified ([place]) 04/24/2012  . Encephalopathy acute 11/14/2011  . Community acquired pneumonia 11/14/2011  . Rheumatoid arthritis(714.0) 11/14/2011  . HTN (hypertension) 11/14/2011  . Hyperlipidemia 11/14/2011  . Gout 11/14/2011  . Anxiety 11/14/2011  . Rheumatoid arthritis ([place]) 04/12/2011    Past Surgical History:  Procedure Laterality Date  . ABDOMINAL HYSTERECTOMY     partial  . ANAL RECTAL MANOMETRY N/A 04/30/2016   Procedure: ANO RECTAL MANOMETRY;  Surgeon: Mauri Pole, MD;  Location: [facility];  Service: Endoscopy;  Laterality: N/A;  . APPENDECTOMY    . BREAST SURGERY Bilateral    benign fatty tumor removed  . CARPAL TUNNEL RELEASE Right   . CERVICAL SPINE SURGERY    . CHOLECYSTECTOMY    . COLONOSCOPY    . KNEE ARTHROPLASTY Right   . LUMBAR DISC SURGERY    . MASTECTOMY, PARTIAL    . MELANOMA EXCISION    . OPERATIVE HYSTEROSCOPY    . TONSILLECTOMY       OB History   None      Home Medications    Prior to Admission medications   Medication Sig Start Date End Date  Taking? Authorizing Provider  ACETAMINOPHEN PO Take by mouth.    [provider]  ACIDOPHILUS LACTOBACILLUS PO Take by mouth.    [provider]  ALPRAZolam Duanne Moron) 0.5 MG tablet Take 0.5 mg by mouth every 12 (twelve) hours as needed. For anxiety    [provider]  Aspirin Effervescent (ALKA-SELTZER ORIGINAL PO) Take by mouth.    [provider]  Aspirin-Acetaminophen-Caffeine (EXCEDRIN PO) Take by mouth.    [provider]  aspirin-sod bicarb-citric acid (ALKA-SELTZER) 325 MG TBEF tablet Take 325 mg by mouth every 6 (six) hours as needed.    [provider]  benzonatate (TESSALON) 100 MG capsule Take 1 capsule (100 mg total) by mouth 3 (three) times daily as needed for cough. 11/07/17   Saguier, Percell Miller, PA-C  Biotin 1000 MCG tablet Take 1,000 mcg by mouth 2 (two) times daily as needed.      [provider]  Cholecalciferol (VITAMIN D3) 2000 UNITS capsule Take 1,000 Units by mouth daily.    [provider]  cyclobenzaprine (FLEXERIL) 10 MG tablet Take 10 mg by mouth 2 (two) times daily. 01/13/16   [provider]  denosumab (PROLIA) 60 MG/ML SOSY injection Inject 60 mg into the skin every 6 (six) months.    [provider]  Flaxseed, Linseed, (FLAXSEED OIL) 1000 MG CAPS Take 1,000 mg by mouth 4 (four) times daily.     [provider]  fluticasone (FLONASE) 50 MCG/ACT nasal spray Place 2 sprays into both nostrils daily. 11/17/17   Shelda Pal, DO  levocetirizine (XYZAL) 5 MG tablet Take 1 tablet (5 mg total) by mouth every evening. 11/07/17   Saguier, Percell Miller, PA-C  lidocaine (LIDODERM) 5 % Place 1 patch onto the skin daily as needed. FOR PAIN.    Remove & Discard patch within 12 hours or as directed by MD    [provider]  metoprolol (LOPRESSOR) 50 MG tablet Take 1 tablet (50 mg total) by mouth 2 (two) times daily. 04/29/16   Shelda Pal, DO  montelukast (SINGULAIR) 10 MG  tablet Take 1 tablet (10 mg total) by mouth at bedtime. 11/17/17   Shelda Pal, DO  nitroGLYCERIN (NITROGLYN) 2 % ointment Apply topically 4 (four) times daily.    [provider]  omeprazole (PRILOSEC) 10 MG capsule Take 20 mg by mouth 2 (two) times daily before a meal.     [provider]  promethazine (PHENERGAN) 25 MG tablet Take 25 mg by mouth as needed.  12/04/13   [provider]  promethazine-dextromethorphan (PROMETHAZINE-DM) 6.25-15 MG/5ML syrup Take 2.5-5 mLs by mouth 4 (four) times daily as needed for cough. 11/21/17   Shelda Pal, DO  pseudoephedrine (SUDAFED) 30 MG tablet Take 30 mg by mouth as needed.     [provider]  riTUXimab (RITUXAN IV) Inject into the vein.    [provider]  topiramate (TOPAMAX) 25 MG tablet Take 2 tablets (50 mg total) by mouth 2 (two) times daily. 09/23/15   Garvin Fila, MD  Turmeric (RA TURMERIC) 500 MG CAPS Take by mouth.    [provider]  UNABLE TO FIND Pt takes probiotics - 2 tablets three times a day    [provider]  valsartan (DIOVAN) 160 MG tablet Take 1 tablet (160 mg total) by mouth daily. 11/17/17   Shelda Pal, DO  Wheat Dextrin (BENEFIBER PO) Take by mouth.    [provider]    Family History Family History  Problem Relation Age of Onset  . Alzheimer's disease Mother   . Colon cancer Mother 72  . Lung cancer Mother   . Dementia Mother   . Melanoma Father   . Non-Hodgkin's lymphoma Father   . Heart disease Maternal Grandmother   . Colon cancer Maternal Grandfather   . Colon cancer Paternal Grandmother   . Colon cancer Paternal Grandfather     Social History Social History   Tobacco Use  . Smoking status: Never Smoker  . Smokeless tobacco: Never Used  Substance Use Topics  . Alcohol use: No  . Drug use: No     Allergies   Butalbital-asa-caff-codeine [fiorinal-codeine]; Darvon; Ambien [zolpidem tartrate];  Amitriptyline; Ativan [lorazepam]; Bactrim [sulfamethoxazole-trimethoprim]; Ciprofloxacin hcl; Fiorinal [butalbital-aspirin-caffeine]; Penicillins; Propoxyphene; Tape; and Benzodiazepines   Review of Systems Review of Systems  Neurological: Positive for dizziness and headaches.  All other systems reviewed and are negative.    Physical Exam Updated Vital Signs BP (!) 141/85 (BP Location: Right Arm)   Pulse 88   Temp 98.4 F (36.9 C) (Oral)   Resp 16   SpO2 98%   Physical Exam  Constitutional: She is oriented to person, place, and time. She appears well-developed and well-nourished. No distress.  Obese female nontoxic in appearance  HENT:  Head: Atraumatic.  Eyes: Pupils are equal, round, and reactive to light. Conjunctivae and EOM are normal.  Neck: Normal range of motion. Neck supple.  No nuchal rigidity  Cardiovascular: Normal rate and regular rhythm.  Pulmonary/Chest: Effort normal and breath sounds normal.  Abdominal: Soft.  Musculoskeletal: Normal range of motion.  Neurological: She is alert and oriented to person, place, and time. GCS eye subscore is 4. GCS verbal subscore is 5. GCS motor subscore is 6.  Neurologic exam:  Speech clear, pupils equal round reactive to light, extraocular movements intact  Left visual field cut peripherally Cranial nerves III through XII normal including no facial droop Follows commands, moves all extremities x4, diminish strength to left upper and lower extremities compaired to right side. Sensation diminished to L side of face, arm and leg. Unequal coordination with finger to nose. Mild left pronator drift Gait not tested   Skin: Skin is warm. No rash noted.  Psychiatric: She has a normal mood and affect.  Nursing note and vitals reviewed.    ED Treatments / Results  Labs (all labs ordered are listed, but only abnormal results are displayed) Labs Reviewed  CBC WITH DIFFERENTIAL/PLATELET - Abnormal; Notable for the following  components:      Result Value   WBC 11.0 (*)    Neutro Abs 8.9 (*)    Abs Immature Granulocytes 0.08 (*)    All other components within normal limits  URINALYSIS, ROUTINE W REFLEX MICROSCOPIC - Abnormal; Notable for the following components:   Leukocytes,  UA LARGE (*)    Bacteria, UA MANY (*)    All other components within normal limits  I-STAT CHEM 8, ED - Abnormal; Notable for the following components:   Chloride 114 (*)    Glucose, Bld 219 (*)    TCO2 18 (*)    All other components within normal limits  URINE CULTURE    EKG None  Radiology Mr Jeri Cos And Wo Contrast  Result Date: 01/13/2018 CLINICAL DATA:  Initial evaluation for acute hemiplegia. EXAM: MRI HEAD WITHOUT AND WITH CONTRAST TECHNIQUE: Multiplanar, multiecho pulse sequences of the brain and surrounding structures were obtained without and with intravenous contrast. CONTRAST:  9 cc of Gadavist. COMPARISON:  Prior MRI from 09/29/2014. FINDINGS: Brain: Cerebral volume within normal limits for age. No focal parenchymal signal abnormality identified. No significant cerebral white matter changes. No abnormal foci of restricted diffusion to suggest acute or subacute ischemia. Gray-white matter differentiation maintained. No encephalomalacia to suggest chronic cortical infarction. No foci of susceptibility artifact to suggest acute or chronic intracranial hemorrhage. No mass lesion, midline shift or mass effect. No hydrocephalus. No extra-axial fluid collection. Pituitary gland normal. No abnormal enhancement. Vascular: Major intracranial vascular flow voids are maintained. Skull and upper cervical spine: Craniocervical junction within normal limits. Mild degenerative spondylolysis noted at C4-5 without significant stenosis. Remainder the visualized upper cervical spine otherwise unremarkable. Bone marrow signal intensity within normal limits. Scalp soft tissues unremarkable. Sinuses/Orbits: Globes and orbital soft tissues within  normal limits. Retention cyst noted within the right sphenoid sinus. Paranasal sinuses are otherwise clear. No significant mastoid effusion. Inner ear structures normal. Other: None. IMPRESSION: Normal brain MRI for age. No acute intracranial abnormality identified. Electronically Signed   By: Jeannine Boga M.D.   On: 01/13/2018 20:27    Procedures Procedures (including critical care time)  Medications Ordered in ED Medications  hydrOXYzine (ATARAX/VISTARIL) tablet 25 mg (25 mg Oral Given 01/13/18 1838)  diazepam (VALIUM) injection 5 mg (5 mg Intravenous Given 01/13/18 1838)  gadobutrol (GADAVIST) 1 MMOL/ML injection 9 mL (9 mLs Intravenous Contrast Given 01/13/18 1959)  ketorolac (TORADOL) 30 MG/ML injection 30 mg (30 mg Intravenous Given 01/13/18 2051)  metoCLOPramide (REGLAN) injection 10 mg (10 mg Intravenous Given 01/13/18 2115)  diphenhydrAMINE (BENADRYL) injection 25 mg (25 mg Intravenous Given 01/13/18 2050)  cefTRIAXone (ROCEPHIN) 1 g in sodium chloride 0.9 % 100 mL IVPB (1 g Intravenous New Bag/Given 01/13/18 2053)     Initial Impression / Assessment and Plan / ED Course  I have reviewed the triage vital signs and the nursing notes.  Pertinent labs & imaging results that were available during my care of the patient were reviewed by me and considered in my medical decision making (see chart for details).     BP 119/63   Pulse 80   Temp 98.4 F (36.9 C) (Oral)   Resp 17   SpO2 97%    Final Clinical Impressions(s) / ED Diagnoses   Final diagnoses:  Hemiparesis of left nondominant side, unspecified hemiparesis etiology ([place])  Lower urinary tract infectious disease    ED Discharge Orders         Ordered    cephALEXin (KEFLEX) 500 MG capsule  3 times daily     01/13/18 2235         5:08 PM Patient was seen and evaluated at an outside hospital for complaints of headache, dizziness, and left side numbness or weakness.  A brain and cervical spine MRI was  performed without any  acute finding. Her sxs persists, and pt was recommended by PCP to go to the ER for MRI of brain and neck with IV CM.  On exam, pt does have L side hemiparesis from face to L arm/leg.  Work up initiated.   Pt had brain MRI w/o CM and MRI of C-spine W and WO CM, both of which were unremarkable.    On initial exam, there were a blister to the roof of pt mouth, which has been irritating her partial denture.  Query potential zoster causing her sxs.    I did consulted our neurologist Dr. Cheral Marker who agrees with brain MRI W and W/O CM.  If negative, pt can f/u outpt for further evaluation.  Care discussed with Dr. Tomi Bamberger.   Pt is claustrophobic.  Will give vistaril and diazepam for relief.   8:24 PM currewntly awaits MRI. Pt given migraine cocktail for headache.  UA shows evidence suggestive of UTI.  Will give Rocephin.  Urine cultures sent.  If Brain MRI unremarkable, pt should f/u outpt for EMG study.  Suspect complicated migraine.   10:33 PM MRI of the brain is normal no acute intracranial abnormality identified.  Patient received migraine cocktail and felt much better.  She also receive Rocephin for UTI.  Patient sent home with Keflex as treatment for urinary tract infection, she will need to follow-up with outpatient neurology for further study.  Return precautions discussed.   Domenic Moras, PA-C 01/13/18 2239    Dorie Rank, MD 01/14/18 [phone]

## 2018-01-16 ENCOUNTER — Telehealth: Payer: Self-pay | Admitting: *Deleted

## 2018-01-16 LAB — URINE CULTURE: Culture: >=100000 — AB

## 2018-01-16 NOTE — Telephone Encounter (Signed)
No answer

## 2018-01-17 ENCOUNTER — Telehealth: Payer: Self-pay | Admitting: *Deleted

## 2018-01-17 NOTE — Telephone Encounter (Signed)
Post ED Visit - Positive Culture Follow-up  Culture report reviewed by antimicrobial stewardship pharmacist:  []  Elenor Quinones, Pharm.D. []  Heide Guile, Pharm.D., BCPS AQ-ID []  Parks Neptune, Pharm.D., BCPS []  Alycia Rossetti, Pharm.D., BCPS []  [place], [place].D., BCPS, AAHIVP []  Legrand Como, Pharm.D., BCPS, AAHIVP []  Salome Arnt, PharmD, BCPS []  Johnnette Gourd, PharmD, BCPS []  Hughes Better, PharmD, BCPS []  Leeroy Cha, PharmD Lucio Edward, PharmD  Positive urine culture Treated with Cephalexin, organism sensitive to the same and no further patient follow-up is required at this time.  Harlon Flor [facility] 01/17/2018, 2:32 PM

## 2018-01-24 ENCOUNTER — Ambulatory Visit: Payer: Medicare HMO | Admitting: Podiatry

## 2018-01-24 DIAGNOSIS — B351 Tinea unguium: Secondary | ICD-10-CM

## 2018-01-24 DIAGNOSIS — Z89421 Acquired absence of other right toe(s): Secondary | ICD-10-CM

## 2018-01-24 DIAGNOSIS — M79674 Pain in right toe(s): Secondary | ICD-10-CM | POA: Diagnosis not present

## 2018-01-24 DIAGNOSIS — L84 Corns and callosities: Secondary | ICD-10-CM | POA: Diagnosis not present

## 2018-01-24 DIAGNOSIS — M2041 Other hammer toe(s) (acquired), right foot: Secondary | ICD-10-CM | POA: Diagnosis not present

## 2018-01-24 DIAGNOSIS — M2042 Other hammer toe(s) (acquired), left foot: Secondary | ICD-10-CM

## 2018-01-24 DIAGNOSIS — M79675 Pain in left toe(s): Secondary | ICD-10-CM

## 2018-01-24 DIAGNOSIS — E1149 Type 2 diabetes mellitus with other diabetic neurological complication: Secondary | ICD-10-CM

## 2018-01-31 NOTE — Progress Notes (Signed)
Subjective: 72 year old female presents the office today for concerns of a possible wound to the top of the right second toe which is been ongoing for 1 month.  She states that she is does not know what started that she has not had any treatment.  She denies any redness or drainage or any swelling to the area.  No recent injury denies any systemic complaints such as fevers, chills, nausea, vomiting. No acute changes since last appointment, and no other complaints at this time.   Objective: AAO x3, NAD DP/PT pulses palpable bilaterally, CRT less than 3 seconds Sensation decreased with Simms Weinstein monofilament Pre-ulcerative area to the dorsal aspect of right second toe on the PIPJ.  There is significant hammertoe contracture present.  Upon opening there is no underlying ulceration of the area is pre-ulcerative.  There is no fluctuation or crepitation or any malodor.  No ascending cellulitis. The nails are hypertrophic, dystrophic, discolored, elongated x9.  Objective the nails are causing irritation nails 1-5 on the left and 1 through 5 on the right except for the fourth toe which is been partially amputated. No other open lesions or pre-ulcerative lesions.  No pain with calf compression, swelling, warmth, erythema  Assessment: 72 year old female feels a very right second toe due to hammertoe deformity  Plan: -All treatment options discussed with the patient including all alternatives, risks, complications.  -I did debride the callus in the dorsal aspect the right second toe without any complications or bleeding.  There is no ongoing ulceration of the area is pre-ulcerative.  Dispensed offloading pads.  Discussed the change in shoes in order to help take pressure off the toe and will check with insurance. -Nails debrided x9 without any complications or bleeding. -Patient encouraged to call the office with any questions, concerns, change in symptoms.   [name] DPM

## 2018-02-07 ENCOUNTER — Ambulatory Visit: Payer: Medicare HMO | Admitting: Podiatry

## 2018-02-10 ENCOUNTER — Ambulatory Visit: Payer: Medicare HMO | Admitting: Podiatry

## 2018-02-19 ENCOUNTER — Encounter (HOSPITAL_BASED_OUTPATIENT_CLINIC_OR_DEPARTMENT_OTHER): Payer: Self-pay | Admitting: Emergency Medicine

## 2018-02-19 ENCOUNTER — Emergency Department (HOSPITAL_BASED_OUTPATIENT_CLINIC_OR_DEPARTMENT_OTHER)
Admission: EM | Admit: 2018-02-19 | Discharge: 2018-02-19 | Disposition: A | Discharge status: Home / Self Care | Payer: Medicare HMO | Attending: Emergency Medicine | Admitting: Emergency Medicine

## 2018-02-19 ENCOUNTER — Other Ambulatory Visit: Payer: Self-pay

## 2018-02-19 DIAGNOSIS — Z79899 Other long term (current) drug therapy: Secondary | ICD-10-CM | POA: Diagnosis not present

## 2018-02-19 DIAGNOSIS — N3000 Acute cystitis without hematuria: Secondary | ICD-10-CM | POA: Insufficient documentation

## 2018-02-19 DIAGNOSIS — J0100 Acute maxillary sinusitis, unspecified: Secondary | ICD-10-CM

## 2018-02-19 DIAGNOSIS — E119 Type 2 diabetes mellitus without complications: Secondary | ICD-10-CM | POA: Insufficient documentation

## 2018-02-19 DIAGNOSIS — Z8543 Personal history of malignant neoplasm of ovary: Secondary | ICD-10-CM | POA: Insufficient documentation

## 2018-02-19 DIAGNOSIS — Z7984 Long term (current) use of oral hypoglycemic drugs: Secondary | ICD-10-CM | POA: Diagnosis not present

## 2018-02-19 DIAGNOSIS — R0981 Nasal congestion: Secondary | ICD-10-CM | POA: Diagnosis present

## 2018-02-19 DIAGNOSIS — I10 Essential (primary) hypertension: Secondary | ICD-10-CM | POA: Insufficient documentation

## 2018-02-19 DIAGNOSIS — Z85828 Personal history of other malignant neoplasm of skin: Secondary | ICD-10-CM | POA: Diagnosis not present

## 2018-02-19 LAB — URINALYSIS, ROUTINE W REFLEX MICROSCOPIC
GLUCOSE, UA: NEGATIVE mg/dL
Ketones, ur: NEGATIVE mg/dL
Nitrite: NEGATIVE
Protein, ur: 30 mg/dL — AB
SPECIFIC GRAVITY, URINE: 1.02 (ref 1.005–1.030)
pH: 5.5 (ref 5.0–8.0)

## 2018-02-19 LAB — URINALYSIS, MICROSCOPIC (REFLEX): WBC, UA: >50 WBC/hpf (ref 0–5)

## 2018-02-19 MED ORDER — CEFPODOXIME PROXETIL 200 MG PO TABS: 200.0000 mg | ORAL_TABLET | Freq: Two times a day (BID) | ORAL | 0 refills | Status: AC

## 2018-02-19 MED ORDER — CEFPODOXIME PROXETIL 200 MG PO TABS: 200.0000 mg | ORAL_TABLET | Freq: Two times a day (BID) | ORAL | 0 refills | Status: DC

## 2018-02-19 NOTE — ED Triage Notes (Signed)
Pt states she is having a cold for the past 2 weeks getting worse with right ear pain and HA, and she is having burning sensation with urination.

## 2018-02-19 NOTE — ED Notes (Signed)
Provided paper RX for patient, concerned that pharmacy would not be open. Patient left at this time with all belongings.

## 2018-02-19 NOTE — Discharge Instructions (Signed)
Take tylenol 2 pills 4 times a day.  Drink plenty of fluids.  Return for worsening shortness of breath, headache, confusion. Follow up with your family doctor.     

## 2018-02-19 NOTE — ED Provider Notes (Signed)
[facility] EMERGENCY DEPARTMENT Provider Note   CSN: [id number] Arrival date & time: 02/19/18  1528     History   Chief Complaint Chief Complaint  Patient presents with  . Influenza  . Otalgia  . Urinary Tract Infection    HPI Kristina Patel is a 72 y.o. female.  72 yo F with cough and congestion.  This been going on for about 5 days.  She started having urinary symptoms yesterday.  Started having fevers as well.  Denies flank pain denies abdominal pain.  Denies chest pain.  Has been taking over-the-counter cough medicines with minimal improvement.  Her spouse is recently gotten over a similar illness.  The history is provided by the patient and the spouse.  Influenza  Presenting symptoms: cough, fever and myalgias   Presenting symptoms: no headaches, no nausea, no rhinorrhea, no shortness of breath and no vomiting   Associated symptoms: chills, ear pain and nasal congestion   Otalgia  Associated symptoms include cough. Pertinent negatives include no headaches, no rhinorrhea, no abdominal pain and no vomiting.  Urinary Tract Infection   Associated symptoms include chills. Pertinent negatives include no nausea, no vomiting and no urgency.  Illness  This is a new problem. The current episode started more than 2 days ago. The problem occurs constantly. The problem has been gradually worsening. Pertinent negatives include no chest pain, no abdominal pain, no headaches and no shortness of breath. Nothing aggravates the symptoms. Nothing relieves the symptoms. She has tried nothing for the symptoms. The treatment provided no relief.    Past Medical History:  Diagnosis Date  . Bacterial overgrowth syndrome   . Breast mass   . Carpal tunnel syndrome   . Colon polyps   . Degenerative disc disease   . Depression   . Fall   . GERD (gastroesophageal reflux disease)   . Gouty arthropathy   . Headache   . Hyperlipidemia   . Hypertension   . IBS (irritable bowel  syndrome)    constipation  . Internal prolapsed hemorrhoids   . Iron deficiency anemia   . Melanoma ([place])   . Osteoarthritis   . Osteoporosis   . Ovarian cancer ([place])   . Pelvic floor dysfunction 05/05/2016  . Peripheral neuropathy   . Prediabetes   . Pulmonary nodules   . Renal disease   . Rheumatoid arthritis(714.0)   . Squamous cell carcinoma skin of abdomen   . Uterine cancer ([place])   . Vitamin D deficiency     Patient Active Problem List   Diagnosis Date Noted  . Adhesive capsulitis of left shoulder 08/14/2017  . Degenerative joint disease of left acromioclavicular joint 06/21/2017  . Incomplete tear of left rotator cuff 06/21/2017  . Lumbar spondylolysis 03/16/2017  . Cervical spondylosis with myelopathy 03/16/2017  . Chronic constipation 06/28/2016  . Anemia 06/28/2016  . Pelvic floor dysfunction 05/05/2016  . Incontinence of feces   . Mild single current episode of major depressive disorder ([place]) 04/14/2016  . Mild cognitive impairment 04/14/2016  . Hx of colonic polyps 04/02/2016  . Family history of colon cancer in mother 04/02/2016  . Cervicogenic headache 04/07/2015  . Tension headache 09/12/2014  . Drug-induced polyneuropathy ([place]) 09/12/2014  . Paresthesias 03/26/2013  . Peripheral vascular disease, unspecified ([place]) 04/24/2012  . Encephalopathy acute 11/14/2011  . Community acquired pneumonia 11/14/2011  . Rheumatoid arthritis(714.0) 11/14/2011  . HTN (hypertension) 11/14/2011  . Hyperlipidemia 11/14/2011  . Gout 11/14/2011  . Anxiety 11/14/2011  .  Rheumatoid arthritis ([place]) 04/12/2011    Past Surgical History:  Procedure Laterality Date  . ABDOMINAL HYSTERECTOMY     partial  . ANAL RECTAL MANOMETRY N/A 04/30/2016   Procedure: ANO RECTAL MANOMETRY;  Surgeon: Mauri Pole, MD;  Location: [facility];  Service: Endoscopy;  Laterality: N/A;  . APPENDECTOMY    . BREAST SURGERY Bilateral    benign fatty tumor removed  . CARPAL TUNNEL RELEASE  Right   . CERVICAL SPINE SURGERY    . CHOLECYSTECTOMY    . COLONOSCOPY    . KNEE ARTHROPLASTY Right   . LUMBAR DISC SURGERY    . MASTECTOMY, PARTIAL    . MELANOMA EXCISION    . OPERATIVE HYSTEROSCOPY    . TONSILLECTOMY       OB History   No obstetric history on file.      Home Medications    Prior to Admission medications   Medication Sig Start Date End Date Taking? Authorizing Provider  acetaminophen (TYLENOL) 325 MG tablet Take 325 mg by mouth every 6 (six) hours as needed for headache (pain).    [provider]  ACIDOPHILUS LACTOBACILLUS PO Take 2 tablets by mouth 3 (three) times daily with meals.     [provider]  ALPRAZolam Duanne Moron) 1 MG tablet Take 1 mg by mouth at bedtime. 12/18/17   [provider]  aspirin-acetaminophen-caffeine (EXCEDRIN MIGRAINE) [phone] MG tablet Take 1 tablet by mouth daily as needed for headache or migraine.    [provider]  buprenorphine (BUTRANS - DOSED MCG/HR) 10 MCG/HR PTWK patch Place 10 mcg onto the skin every Friday.    [provider]  cefpodoxime (VANTIN) 200 MG tablet Take 1 tablet (200 mg total) by mouth 2 (two) times daily for 14 days. 02/19/18 03/05/18  Deno Etienne, DO  cephALEXin (KEFLEX) 500 MG capsule Take 1 capsule (500 mg total) by mouth 3 (three) times daily. 01/13/18   Domenic Moras, PA-C  cholecalciferol (VITAMIN D) 25 MCG (1000 UT) tablet Take 1,000 Units by mouth daily with supper.     [provider]  cyclobenzaprine (FLEXERIL) 10 MG tablet Take 10 mg by mouth at bedtime.  01/13/16   [provider]  denosumab (PROLIA) 60 MG/ML SOSY injection Inject 60 mg into the skin every 6 (six) months. Last injection July 2019    [provider]  diclofenac sodium (VOLTAREN) 1 % GEL Apply 2 g topically daily as needed (pain).    [provider]  Flaxseed, Linseed, (FLAXSEED OIL) 1000 MG CAPS Take 1,000 mg by mouth daily with supper.     [provider]  glipiZIDE (GLUCOTROL) 5 MG tablet Take 2.5 mg by mouth 2 (two) times daily before a meal.    [provider]  lidocaine (LIDODERM) 5 % Place 1 patch onto the skin daily as needed (pain). Remove & Discard patch within 12 hours or as directed by MD    [provider]  lidocaine (XYLOCAINE) 2 % jelly Apply 1 application topically 2 (two) times daily as needed (joint pain).    [provider]  meclizine (ANTIVERT) 25 MG tablet Take 25 mg by mouth 3 (three) times daily as needed for dizziness.  01/12/18   [provider]  methocarbamol (ROBAXIN) 500 MG tablet Take 500 mg by mouth 2 (two) times daily as needed for muscle spasms.    [provider]  metoprolol (LOPRESSOR) 50 MG tablet Take 1 tablet (50 mg total) by mouth 2 (two)  times daily. 04/29/16   Shelda Pal, DO  Multiple Vitamin (MULTIVITAMIN WITH MINERALS) TABS tablet Take 1 tablet by mouth daily with supper.    [provider]  pantoprazole (PROTONIX) 40 MG tablet Take 40 mg by mouth 2 (two) times daily before a meal.    [provider]  ranitidine (ZANTAC) 300 MG tablet Take 300 mg by mouth at bedtime.    [provider]  topiramate (TOPAMAX) 25 MG tablet Take 2 tablets (50 mg total) by mouth 2 (two) times daily. 09/23/15   Garvin Fila, MD  TURMERIC PO Take 900 mg by mouth 3 (three) times daily with meals.    [provider]  valACYclovir (VALTREX) 1000 MG tablet  01/14/18   [provider]  valsartan (DIOVAN) 160 MG tablet Take 1 tablet (160 mg total) by mouth daily. 11/17/17   Shelda Pal, DO  vitamin B-12 (CYANOCOBALAMIN) 1000 MCG tablet Take 1,000 mcg by mouth daily.    [provider]  vitamin C (ASCORBIC ACID) 500 MG tablet Take 500 mg by mouth 2 (two) times daily.    [provider]    Family History Family History  Problem Relation Age of Onset  . Alzheimer's disease Mother   . Colon cancer Mother 33    . Lung cancer Mother   . Dementia Mother   . Melanoma Father   . Non-Hodgkin's lymphoma Father   . Heart disease Maternal Grandmother   . Colon cancer Maternal Grandfather   . Colon cancer Paternal Grandmother   . Colon cancer Paternal Grandfather     Social History Social History   Tobacco Use  . Smoking status: Never Smoker  . Smokeless tobacco: Never Used  Substance Use Topics  . Alcohol use: No  . Drug use: No     Allergies   Butalbital-asa-caff-codeine [fiorinal-codeine]; Darvon; Fiorinal [butalbital-aspirin-caffeine]; Penicillins; Amitriptyline; Ativan [lorazepam]; Bactrim [sulfamethoxazole-trimethoprim]; Ciprofloxacin hcl; Tape; Ambien [zolpidem tartrate]; Benzodiazepines; and Propoxyphene   Review of Systems Review of Systems  Constitutional: Positive for chills and fever.  HENT: Positive for congestion and ear pain. Negative for rhinorrhea.   Eyes: Negative for redness and visual disturbance.  Respiratory: Positive for cough. Negative for shortness of breath and wheezing.   Cardiovascular: Negative for chest pain and palpitations.  Gastrointestinal: Negative for abdominal pain, nausea and vomiting.  Genitourinary: Positive for dysuria. Negative for urgency.  Musculoskeletal: Positive for myalgias. Negative for arthralgias.  Skin: Negative for pallor and wound.  Neurological: Negative for dizziness and headaches.     Physical Exam Updated Vital Signs BP (!) 106/56 (BP Location: Left Arm)   Pulse 81   Temp (!) 100.5 F (38.1 C) (Oral)   Resp 20   Ht 5\' 3"  (1.6 m)   Wt 83.5 kg   SpO2 100%   BMI 32.59 kg/m   Physical Exam Vitals signs and nursing note reviewed.  Constitutional:      General: She is not in acute distress.    Appearance: She is well-developed. She is not diaphoretic.  HENT:     Head: Normocephalic and atraumatic.     Comments: Swollen turbinates, posterior nasal drip, right maxillary sinuses tender to percussion, tm normal  bilaterally.      Right Ear: Tympanic membrane normal.     Left Ear: Tympanic membrane normal.  Eyes:     Pupils: Pupils are equal, round, and reactive to light.  Neck:     Musculoskeletal: Normal range of motion and neck supple.  Cardiovascular:     Rate and Rhythm: Normal rate and regular rhythm.     Heart sounds: No murmur. No friction rub. No gallop.   Pulmonary:     Effort: Pulmonary effort is normal.     Breath sounds: No wheezing or rales.  Abdominal:     General: There is no distension.     Palpations: Abdomen is soft.     Tenderness: There is no abdominal tenderness.  Musculoskeletal:        General: No tenderness.  Skin:    General: Skin is warm and dry.  Neurological:     Mental Status: She is alert and oriented to person, place, and time.  Psychiatric:        Behavior: Behavior normal.      ED Treatments / Results  Labs (all labs ordered are listed, but only abnormal results are displayed) Labs Reviewed  URINALYSIS, ROUTINE W REFLEX MICROSCOPIC - Abnormal; Notable for the following components:      Result Value   APPearance CLOUDY (*)    Hgb urine dipstick LARGE (*)    Bilirubin Urine SMALL (*)    Protein, ur 30 (*)    Leukocytes, UA LARGE (*)    All other components within normal limits  URINALYSIS, MICROSCOPIC (REFLEX) - Abnormal; Notable for the following components:   Bacteria, UA FEW (*)    All other components within normal limits    EKG None  Radiology No results found.  Procedures Procedures (including critical care time)  Medications Ordered in ED Medications - No data to display   Initial Impression / Assessment and Plan / ED Course  I have reviewed the triage vital signs and the nursing notes.  Pertinent labs & imaging results that were available during my care of the patient were reviewed by me and considered in my medical decision making (see chart for details).     72 yo F with a chief complaint of a fever after having 5  days of what sounds like an upper respiratory illness.  This concerning for bacterial superinfection.  Her lungs are clear on my exam.  She does have significant right ear pain and has tenderness over the right maxillary sinus.  I suspect that she has sinusitis.  She is also complaining of urinary symptoms as positive leukocyte esterase but rare bacteria.  I chose an antibiotic that should cover both of these pathology.  Discharge home.  PCP follow-up.  7:24 PM:  I have discussed the diagnosis/risks/treatment options with the patient and family and believe the pt to be eligible for discharge home to follow-up with PCP. We also discussed returning to the ED immediately if new or worsening sx occur. We discussed the sx which are most concerning (e.g., sudden worsening pain, fever, inability to tolerate by mouth) that necessitate immediate return. Medications administered to the patient during their visit and any new prescriptions provided to the patient are listed below.  Medications given during this visit Medications - No data to display    The patient appears reasonably screen and/or stabilized for discharge and I doubt any other medical condition or other [facility] requiring further screening, evaluation, or treatment in the ED at this time prior to discharge.    Final Clinical Impressions(s) / ED Diagnoses   Final diagnoses:  Acute maxillary sinusitis, recurrence not specified  Acute cystitis without hematuria    ED Discharge Orders         Ordered    cefpodoxime (VANTIN) 200  MG tablet  2 times daily,   Status:  Discontinued     02/19/18 1857    cefpodoxime (VANTIN) 200 MG tablet  2 times daily,   Status:  Discontinued     02/19/18 1916    cefpodoxime (VANTIN) 200 MG tablet  2 times daily     02/19/18 1918           Deno Etienne, DO 02/19/18 1924

## 2018-02-19 NOTE — ED Notes (Signed)
ED Provider at bedside. 

## 2018-02-23 ENCOUNTER — Ambulatory Visit: Payer: Medicare HMO | Admitting: Podiatry

## 2018-02-23 DIAGNOSIS — M2042 Other hammer toe(s) (acquired), left foot: Secondary | ICD-10-CM

## 2018-02-23 DIAGNOSIS — Z89421 Acquired absence of other right toe(s): Secondary | ICD-10-CM

## 2018-02-23 DIAGNOSIS — M21619 Bunion of unspecified foot: Secondary | ICD-10-CM

## 2018-02-23 DIAGNOSIS — M79674 Pain in right toe(s): Secondary | ICD-10-CM | POA: Diagnosis not present

## 2018-02-23 DIAGNOSIS — M79675 Pain in left toe(s): Secondary | ICD-10-CM

## 2018-02-23 DIAGNOSIS — M2041 Other hammer toe(s) (acquired), right foot: Secondary | ICD-10-CM

## 2018-02-23 DIAGNOSIS — E1149 Type 2 diabetes mellitus with other diabetic neurological complication: Secondary | ICD-10-CM

## 2018-02-28 NOTE — Progress Notes (Signed)
Subjective: 73 year old female presents the office today for follow-up evaluation of wound to the top of her right second toe she states the area has healed and is doing much better.  Denies any drainage or pus coming from the area.  Overall she is been doing well otherwise. No recent injury denies any systemic complaints such as fevers, chills, nausea, vomiting. No acute changes since last appointment, and no other complaints at this time.   Objective: AAO x3, NAD DP/PT pulses palpable bilaterally, CRT less than 3 seconds Sensation decreased with Simms Weinstein monofilament Pre-ulcerative area to the dorsal aspect of right second toe on the PIPJ has appears to be doing much better.  There is no edema, erythema, drainage or pus there is no clinical signs of infection noted today.  Significant bunion as well as hammertoe deformities are present.  Hammertoe deformity is rigid.  There is no other open lesions or pre-ulcerative lesions identified.  No other open lesions or pre-ulcerative lesions.  No pain with calf compression, swelling, warmth, erythema  Assessment: 73 year old female presents to the area right second toe due to hammertoe deformity  Plan: -All treatment options discussed with the patient including all alternatives, risks, complications.  -There is no significant hyperkeratotic tissue present to the toe today.  On her continue offloading.  Paperwork was completed today for precertification diabetic shoes.  Given her bunion as well as hammertoe deformity history of ulceration and amputation he should benefit from diabetic shoes to help prevent further amputation or infections.  [name] DPM

## 2018-03-02 DIAGNOSIS — M4306 Spondylolysis, lumbar region: Secondary | ICD-10-CM | POA: Diagnosis not present

## 2018-03-15 ENCOUNTER — Ambulatory Visit: Payer: Medicare HMO | Admitting: Orthotics

## 2018-03-16 DIAGNOSIS — R69 Illness, unspecified: Secondary | ICD-10-CM | POA: Diagnosis not present

## 2018-03-30 DIAGNOSIS — M19012 Primary osteoarthritis, left shoulder: Secondary | ICD-10-CM | POA: Diagnosis not present

## 2018-03-30 DIAGNOSIS — M75112 Incomplete rotator cuff tear or rupture of left shoulder, not specified as traumatic: Secondary | ICD-10-CM | POA: Diagnosis not present

## 2018-04-12 DIAGNOSIS — H524 Presbyopia: Secondary | ICD-10-CM | POA: Diagnosis not present

## 2018-04-17 DIAGNOSIS — H2512 Age-related nuclear cataract, left eye: Secondary | ICD-10-CM | POA: Diagnosis not present

## 2018-04-17 DIAGNOSIS — H2513 Age-related nuclear cataract, bilateral: Secondary | ICD-10-CM | POA: Diagnosis not present

## 2018-04-25 ENCOUNTER — Ambulatory Visit: Payer: Medicare HMO | Admitting: Podiatry

## 2018-06-08 ENCOUNTER — Encounter: Payer: Self-pay | Admitting: Podiatry

## 2018-06-08 ENCOUNTER — Other Ambulatory Visit: Payer: Self-pay

## 2018-06-08 ENCOUNTER — Ambulatory Visit: Payer: Medicare HMO | Admitting: Podiatry

## 2018-06-08 DIAGNOSIS — M79675 Pain in left toe(s): Secondary | ICD-10-CM

## 2018-06-08 DIAGNOSIS — B351 Tinea unguium: Secondary | ICD-10-CM

## 2018-06-08 DIAGNOSIS — E1149 Type 2 diabetes mellitus with other diabetic neurological complication: Secondary | ICD-10-CM

## 2018-06-08 DIAGNOSIS — M79674 Pain in right toe(s): Secondary | ICD-10-CM

## 2018-06-08 DIAGNOSIS — M2042 Other hammer toe(s) (acquired), left foot: Secondary | ICD-10-CM

## 2018-06-08 DIAGNOSIS — L84 Corns and callosities: Secondary | ICD-10-CM

## 2018-06-08 DIAGNOSIS — M2041 Other hammer toe(s) (acquired), right foot: Secondary | ICD-10-CM

## 2018-06-08 MED ORDER — MUPIROCIN 2 % EX OINT: 1.0000 "application " | TOPICAL_OINTMENT | Freq: Two times a day (BID) | CUTANEOUS | 2 refills | Status: DC

## 2018-06-12 NOTE — Progress Notes (Signed)
Subjective: 73 year old female presents the office today for concerns of thick, elongated, painful nails that she cannot trim himself.  She denies any redness or drainage from the toenail sites.  She also states the top of the right second toe has been hurting been somewhat red but she denies any skin breakdown she denies any drainage or pus coming from the area. Denies any systemic complaints such as fevers, chills, nausea, vomiting. No acute changes since last appointment, and no other complaints at this time.   Objective: AAO x3, NAD DP/PT pulses palpable bilaterally, CRT less than 3 seconds Sensation decreased with Simms Weinstein monofilament Pre-ulcerative area to the dorsal aspect of right second toe on the PIPJ.  Upon debridement there is no ongoing ulceration, drainage or pus identified today.  Rigid hammertoe contractures are present. Nails are hypertrophic, dystrophic, discolored.  She said nails are causing irritation to her shoes all nails except the right fourth toe which is been partially treated. No other open lesions or pre-ulcerative lesions.  No pain with calf compression, swelling, warmth, erythema  Assessment: 73 year old female presents to the area right second toe due to hammertoe deformity; symptomatic onychomycosis  Plan: -All treatment options discussed with the patient including all alternatives, risks, complications.  -Nails sharply debrided x9 without any complications or bleeding. -There is very minimally hyperkeratotic tissue to the right 2nd toe. No underlying ulceration or drainage today. Continue antibiotic ointment and bandage daily. Offloading. Monitor for any clinical signs or symptoms of infection and directed to call the office immediately should any occur or go to the ER.  Return in about 9 weeks (around 08/10/2018).  [name] DPM

## 2018-07-12 ENCOUNTER — Encounter: Payer: Self-pay | Admitting: Internal Medicine

## 2018-07-18 DIAGNOSIS — M79605 Pain in left leg: Secondary | ICD-10-CM | POA: Diagnosis not present

## 2018-07-18 DIAGNOSIS — M7502 Adhesive capsulitis of left shoulder: Secondary | ICD-10-CM | POA: Diagnosis not present

## 2018-07-18 DIAGNOSIS — Z1159 Encounter for screening for other viral diseases: Secondary | ICD-10-CM | POA: Diagnosis not present

## 2018-07-18 DIAGNOSIS — M5416 Radiculopathy, lumbar region: Secondary | ICD-10-CM | POA: Diagnosis not present

## 2018-07-18 DIAGNOSIS — M75112 Incomplete rotator cuff tear or rupture of left shoulder, not specified as traumatic: Secondary | ICD-10-CM | POA: Diagnosis not present

## 2018-07-18 DIAGNOSIS — M19012 Primary osteoarthritis, left shoulder: Secondary | ICD-10-CM | POA: Diagnosis not present

## 2018-07-19 DIAGNOSIS — M5416 Radiculopathy, lumbar region: Secondary | ICD-10-CM | POA: Insufficient documentation

## 2018-08-09 ENCOUNTER — Ambulatory Visit: Payer: No Typology Code available for payment source | Admitting: Family Medicine

## 2018-08-10 DIAGNOSIS — M48061 Spinal stenosis, lumbar region without neurogenic claudication: Secondary | ICD-10-CM | POA: Diagnosis not present

## 2018-08-10 DIAGNOSIS — M7918 Myalgia, other site: Secondary | ICD-10-CM | POA: Diagnosis not present

## 2018-08-10 DIAGNOSIS — G894 Chronic pain syndrome: Secondary | ICD-10-CM | POA: Diagnosis not present

## 2018-08-10 DIAGNOSIS — G8929 Other chronic pain: Secondary | ICD-10-CM | POA: Diagnosis not present

## 2018-08-10 DIAGNOSIS — M545 Low back pain: Secondary | ICD-10-CM | POA: Diagnosis not present

## 2018-08-10 DIAGNOSIS — M2550 Pain in unspecified joint: Secondary | ICD-10-CM | POA: Diagnosis not present

## 2018-08-10 DIAGNOSIS — M5136 Other intervertebral disc degeneration, lumbar region: Secondary | ICD-10-CM | POA: Diagnosis not present

## 2018-08-10 DIAGNOSIS — M4726 Other spondylosis with radiculopathy, lumbar region: Secondary | ICD-10-CM | POA: Diagnosis not present

## 2018-08-14 DIAGNOSIS — K219 Gastro-esophageal reflux disease without esophagitis: Secondary | ICD-10-CM | POA: Diagnosis not present

## 2018-08-14 DIAGNOSIS — R05 Cough: Secondary | ICD-10-CM | POA: Diagnosis not present

## 2018-08-14 DIAGNOSIS — M51369 Other intervertebral disc degeneration, lumbar region without mention of lumbar back pain or lower extremity pain: Secondary | ICD-10-CM | POA: Insufficient documentation

## 2018-08-14 DIAGNOSIS — M5459 Other low back pain: Secondary | ICD-10-CM | POA: Insufficient documentation

## 2018-08-14 DIAGNOSIS — M5136 Other intervertebral disc degeneration, lumbar region: Secondary | ICD-10-CM | POA: Insufficient documentation

## 2018-08-14 DIAGNOSIS — M48061 Spinal stenosis, lumbar region without neurogenic claudication: Secondary | ICD-10-CM | POA: Insufficient documentation

## 2018-08-14 DIAGNOSIS — M545 Low back pain: Secondary | ICD-10-CM | POA: Insufficient documentation

## 2018-08-14 DIAGNOSIS — G894 Chronic pain syndrome: Secondary | ICD-10-CM | POA: Insufficient documentation

## 2018-08-14 DIAGNOSIS — M2550 Pain in unspecified joint: Secondary | ICD-10-CM | POA: Insufficient documentation

## 2018-08-14 DIAGNOSIS — G8929 Other chronic pain: Secondary | ICD-10-CM | POA: Insufficient documentation

## 2018-08-14 DIAGNOSIS — M7918 Myalgia, other site: Secondary | ICD-10-CM | POA: Insufficient documentation

## 2018-08-15 ENCOUNTER — Encounter: Payer: Self-pay | Admitting: Podiatry

## 2018-08-15 ENCOUNTER — Other Ambulatory Visit: Payer: Self-pay

## 2018-08-15 ENCOUNTER — Ambulatory Visit: Payer: Medicare HMO | Admitting: Podiatry

## 2018-08-15 VITALS — Temp 97.9°F

## 2018-08-15 DIAGNOSIS — M79674 Pain in right toe(s): Secondary | ICD-10-CM | POA: Diagnosis not present

## 2018-08-15 DIAGNOSIS — M79675 Pain in left toe(s): Secondary | ICD-10-CM

## 2018-08-15 DIAGNOSIS — E1149 Type 2 diabetes mellitus with other diabetic neurological complication: Secondary | ICD-10-CM | POA: Diagnosis not present

## 2018-08-15 DIAGNOSIS — B351 Tinea unguium: Secondary | ICD-10-CM

## 2018-08-15 DIAGNOSIS — L84 Corns and callosities: Secondary | ICD-10-CM | POA: Diagnosis not present

## 2018-08-22 NOTE — Progress Notes (Signed)
Subjective: 73 year old female presents the office today for concerns of thick, elongated, painful nails that she cannot trim himself.  She denies any redness or drainage from the toenail sites.  She states that the right second toes been doing well.  She denies any other open sores.  No fevers, chills, nausea, vomiting.  No calf pain, chest pain, shortness of breath.  Her last A1c she reports was 7.0 and her sugar this morning was 81.  Objective: AAO x3, NAD DP/PT pulses palpable bilaterally, CRT less than 3 seconds Sensation decreased with Simms Weinstein monofilament Pre-ulcerative area to the dorsal aspect of right second toe on the PIPJ.  Mild erythema from rubbing but there is no increase in warmth or drainage or pus or other signs of infection.  Also pre-ulcerative callus present distal aspect the right third toe but again no ongoing ulceration drainage or signs of infection. Nails are hypertrophic, dystrophic, discolored x9.  She said nails are causing irritation to her shoes all nails except the right fourth toe which is been partially treated. No other open lesions or pre-ulcerative lesions.  No pain with calf compression, swelling, warmth, erythema  Assessment: 73 year old female presents to the area right second toe due to hammertoe deformity; symptomatic onychomycosis  Plan: -All treatment options discussed with the patient including all alternatives, risks, complications.  -Nails sharply debrided x9 without any complications or bleeding. -There is very minimally hyperkeratotic tissue to the right 3rd toe. No underlying ulceration or drainage today. Continue antibiotic ointment and bandage daily.  Continue offloading for the second and third toes.  Offloading. Monitor for any clinical signs or symptoms of infection and directed to call the office immediately should any occur or go to the ER.  Return in about 9 weeks  [name] DPM

## 2018-08-31 DIAGNOSIS — M069 Rheumatoid arthritis, unspecified: Secondary | ICD-10-CM | POA: Diagnosis not present

## 2018-08-31 DIAGNOSIS — M25649 Stiffness of unspecified hand, not elsewhere classified: Secondary | ICD-10-CM | POA: Diagnosis not present

## 2018-08-31 DIAGNOSIS — M7502 Adhesive capsulitis of left shoulder: Secondary | ICD-10-CM | POA: Diagnosis not present

## 2018-09-07 DIAGNOSIS — M75102 Unspecified rotator cuff tear or rupture of left shoulder, not specified as traumatic: Secondary | ICD-10-CM | POA: Diagnosis not present

## 2018-09-07 DIAGNOSIS — M7502 Adhesive capsulitis of left shoulder: Secondary | ICD-10-CM | POA: Diagnosis not present

## 2018-09-12 DIAGNOSIS — M7502 Adhesive capsulitis of left shoulder: Secondary | ICD-10-CM | POA: Diagnosis not present

## 2018-09-12 DIAGNOSIS — M75102 Unspecified rotator cuff tear or rupture of left shoulder, not specified as traumatic: Secondary | ICD-10-CM | POA: Diagnosis not present

## 2018-09-14 DIAGNOSIS — M75102 Unspecified rotator cuff tear or rupture of left shoulder, not specified as traumatic: Secondary | ICD-10-CM | POA: Diagnosis not present

## 2018-09-14 DIAGNOSIS — M7502 Adhesive capsulitis of left shoulder: Secondary | ICD-10-CM | POA: Diagnosis not present

## 2018-09-19 DIAGNOSIS — M7502 Adhesive capsulitis of left shoulder: Secondary | ICD-10-CM | POA: Diagnosis not present

## 2018-09-19 DIAGNOSIS — M75102 Unspecified rotator cuff tear or rupture of left shoulder, not specified as traumatic: Secondary | ICD-10-CM | POA: Diagnosis not present

## 2018-09-21 DIAGNOSIS — M7502 Adhesive capsulitis of left shoulder: Secondary | ICD-10-CM | POA: Diagnosis not present

## 2018-09-21 DIAGNOSIS — M75102 Unspecified rotator cuff tear or rupture of left shoulder, not specified as traumatic: Secondary | ICD-10-CM | POA: Diagnosis not present

## 2018-10-02 DIAGNOSIS — M25649 Stiffness of unspecified hand, not elsewhere classified: Secondary | ICD-10-CM | POA: Diagnosis not present

## 2018-10-02 DIAGNOSIS — M79641 Pain in right hand: Secondary | ICD-10-CM | POA: Diagnosis not present

## 2018-10-02 DIAGNOSIS — M79642 Pain in left hand: Secondary | ICD-10-CM | POA: Diagnosis not present

## 2018-10-05 DIAGNOSIS — M7502 Adhesive capsulitis of left shoulder: Secondary | ICD-10-CM | POA: Diagnosis not present

## 2018-10-05 DIAGNOSIS — M75102 Unspecified rotator cuff tear or rupture of left shoulder, not specified as traumatic: Secondary | ICD-10-CM | POA: Diagnosis not present

## 2018-10-10 DIAGNOSIS — M75102 Unspecified rotator cuff tear or rupture of left shoulder, not specified as traumatic: Secondary | ICD-10-CM | POA: Diagnosis not present

## 2018-10-10 DIAGNOSIS — M25461 Effusion, right knee: Secondary | ICD-10-CM | POA: Diagnosis not present

## 2018-10-10 DIAGNOSIS — M7502 Adhesive capsulitis of left shoulder: Secondary | ICD-10-CM | POA: Diagnosis not present

## 2018-10-12 IMAGING — DX DG CHEST 2V
2 series · 2 of 2 positions shown · non-contrast
Comparison: 11/14/2011 and chest CT dated 11/15/2011.

CLINICAL DATA: Cough for the past month.

EXAM:
CHEST - 2 VIEW

[chest pa]
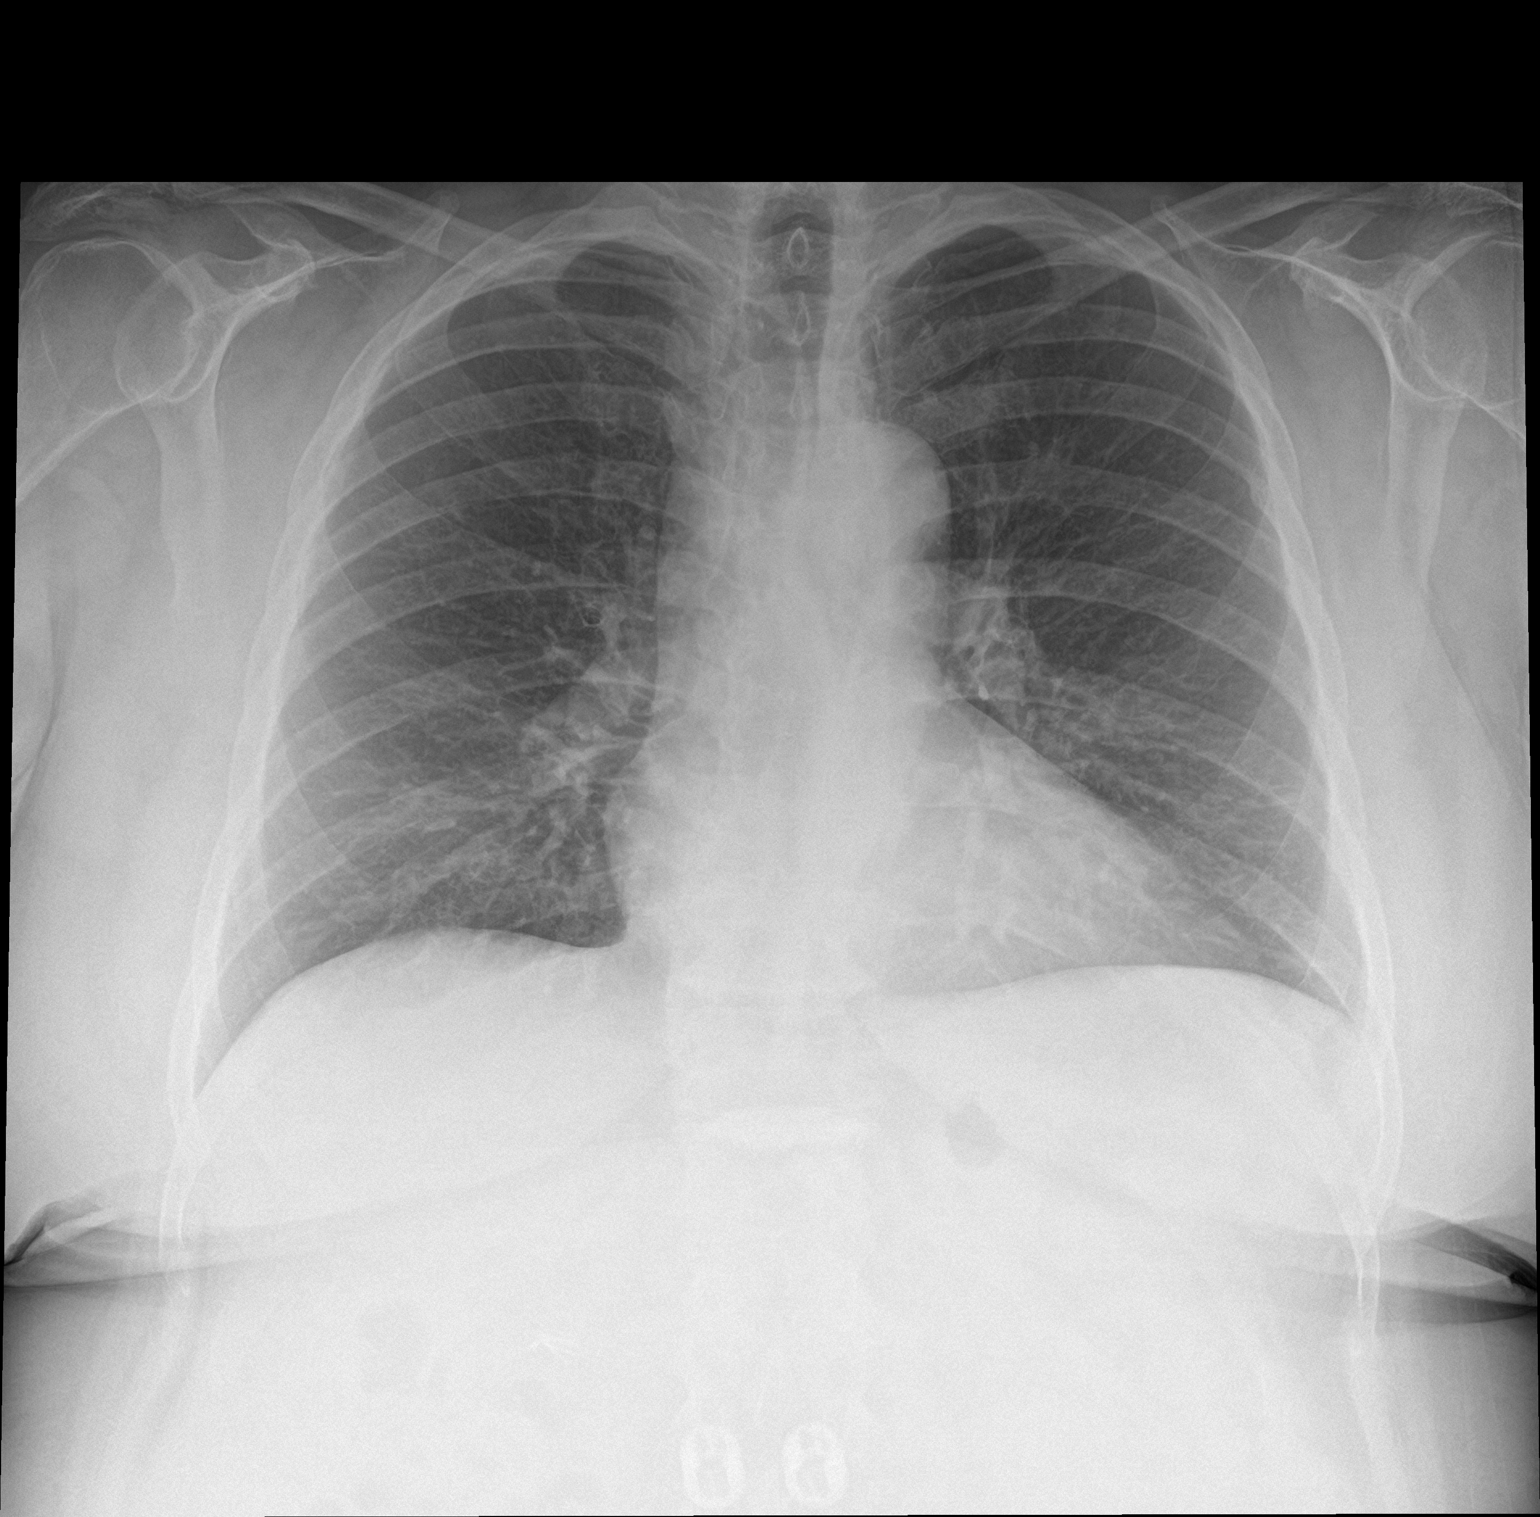

[chest lat]
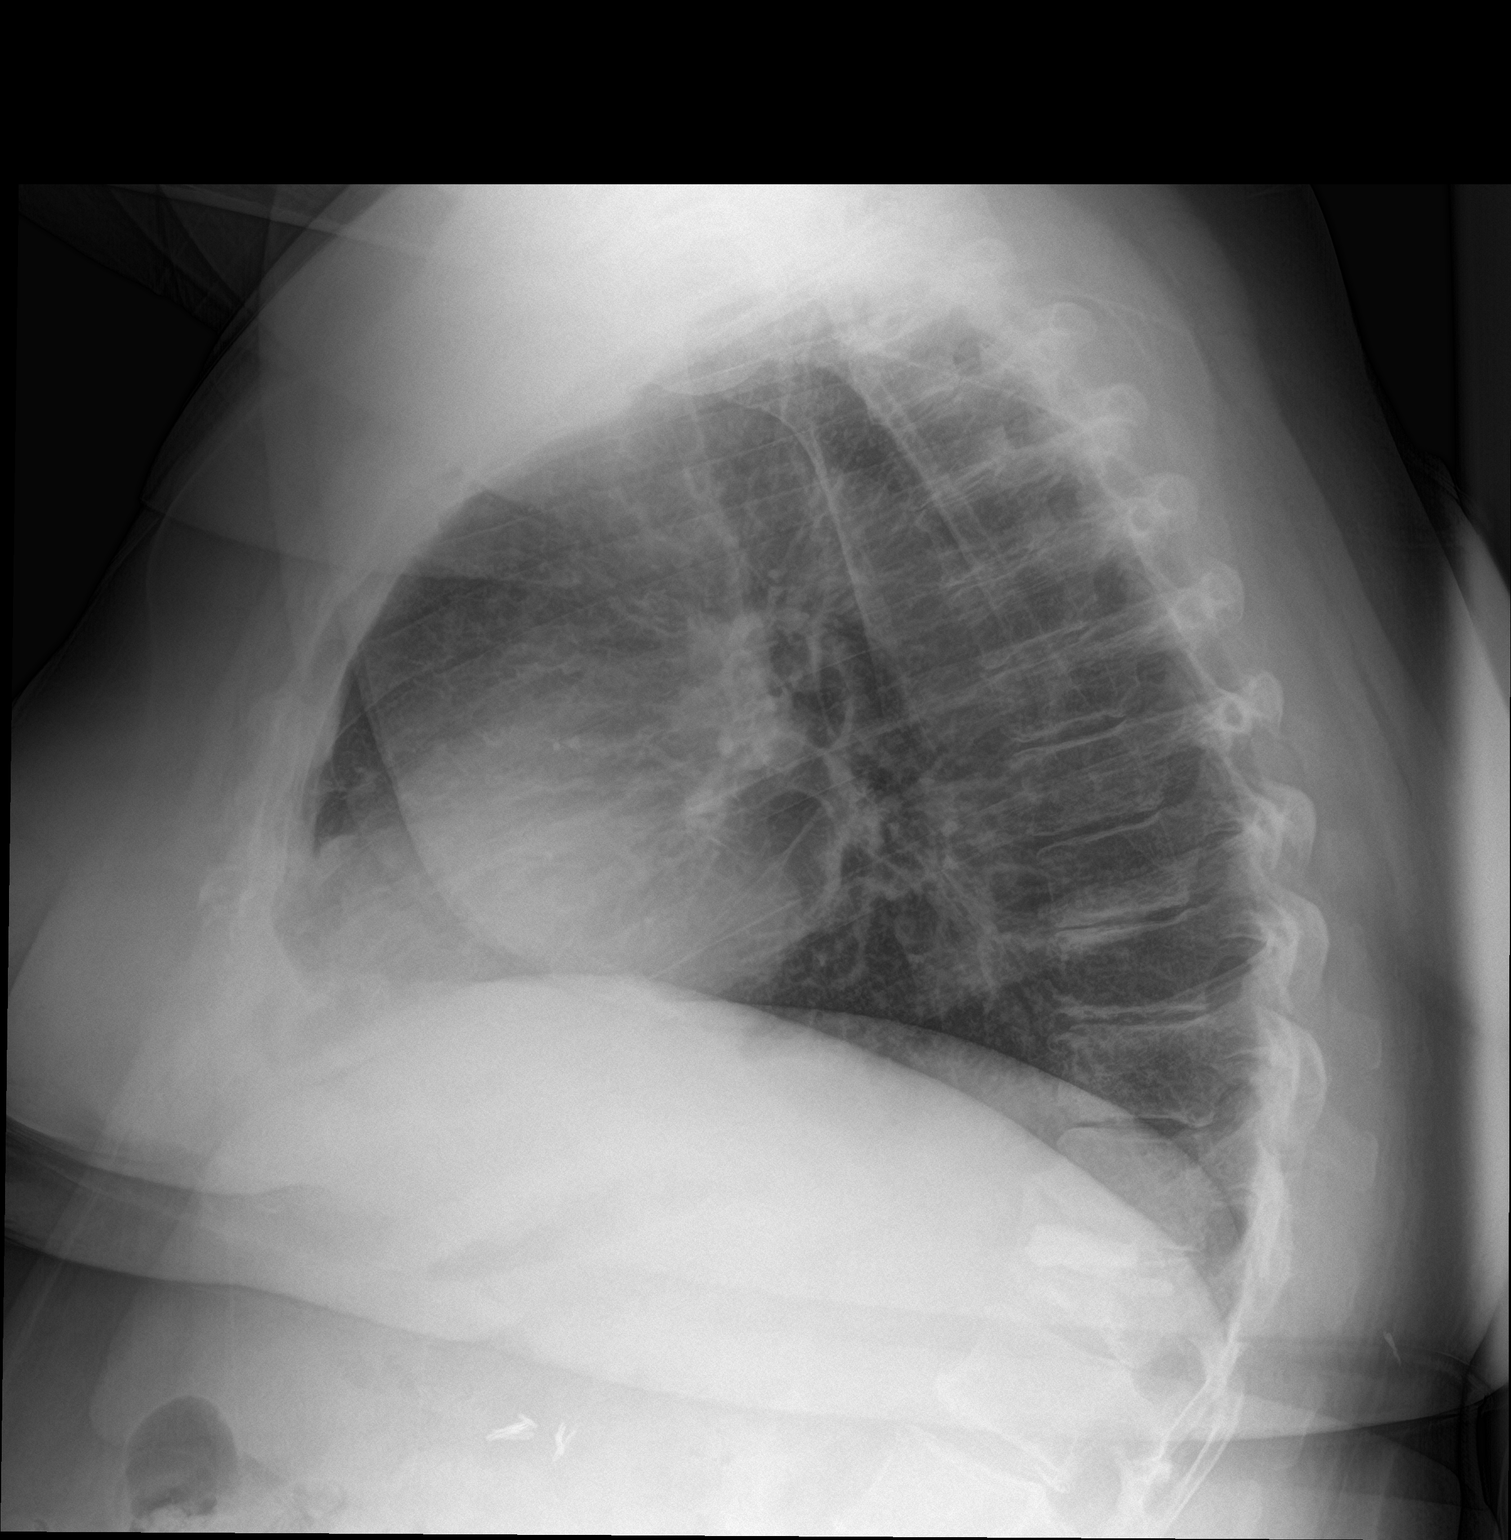

[2 of 2 positions shown; findings below may reference images not displayed]

FINDINGS: Normal sized heart. Minimal linear density in both lower lung zones
with improvement. Mild diffuse peribronchial thickening. Thoracic
and upper lumbar spine degenerative changes. T12 vertebral
compression deformity with kyphoplasty material. Lumbar spine Ray
cages. Cholecystectomy clips.
IMPRESSION: 1. Mild bronchitic changes.
2. Minimal bilateral linear atelectasis or scarring.

## 2018-10-17 ENCOUNTER — Ambulatory Visit: Payer: Medicare HMO | Admitting: Podiatry

## 2018-10-17 DIAGNOSIS — M75102 Unspecified rotator cuff tear or rupture of left shoulder, not specified as traumatic: Secondary | ICD-10-CM | POA: Diagnosis not present

## 2018-10-17 DIAGNOSIS — M7502 Adhesive capsulitis of left shoulder: Secondary | ICD-10-CM | POA: Diagnosis not present

## 2018-10-18 ENCOUNTER — Ambulatory Visit: Payer: Medicare HMO | Admitting: Podiatry

## 2018-10-18 ENCOUNTER — Encounter: Payer: Self-pay | Admitting: Podiatry

## 2018-10-18 ENCOUNTER — Other Ambulatory Visit: Payer: Self-pay

## 2018-10-18 DIAGNOSIS — B351 Tinea unguium: Secondary | ICD-10-CM | POA: Diagnosis not present

## 2018-10-18 DIAGNOSIS — M2041 Other hammer toe(s) (acquired), right foot: Secondary | ICD-10-CM

## 2018-10-18 DIAGNOSIS — E1149 Type 2 diabetes mellitus with other diabetic neurological complication: Secondary | ICD-10-CM | POA: Diagnosis not present

## 2018-10-18 DIAGNOSIS — Z89421 Acquired absence of other right toe(s): Secondary | ICD-10-CM

## 2018-10-18 DIAGNOSIS — M2042 Other hammer toe(s) (acquired), left foot: Secondary | ICD-10-CM

## 2018-10-18 NOTE — Progress Notes (Signed)
Complaint:  Visit Type: Patient returns to my office for continued preventative foot care services. Complaint: Patient states" my nails have grown long and thick and become painful to walk and wear shoes" Patient has been diagnosed with DM with neuropathy. The patient presents for preventative foot care services. No changes to ROS  Podiatric Exam: Vascular: dorsalis pedis and posterior tibial pulses are palpable bilateral. Capillary return is immediate. Temperature gradient is WNL. Skin turgor WNL  Sensorium: Diminished  Semmes Weinstein monofilament test. Normal tactile sensation bilaterally. Nail Exam: Pt has thick disfigured discolored nails with subungual debris noted bilateral entire nail hallux through fifth toenails Ulcer Exam: There is no evidence of ulcer or pre-ulcerative changes or infection. Orthopedic Exam: Muscle tone and strength are WNL. No limitations in general ROM. No crepitus or effusions noted. Foot type and digits show no abnormalities. Bony prominences are unremarkable. 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  Hammer toes  B/L especially second toe right foot. Skin: No Porokeratosis. No infection or ulcers  Diagnosis:  Onychomycosis, , Pain in right toe, pain in left toes  Treatment & Plan Procedures and Treatment: Consent by patient was obtained for treatment procedures.   Debridement of mycotic and hypertrophic toenail x 9 and  clearing of subungual debris. No ulceration, no infection noted. Second toe right foot has no evidence of ulceration. Return Visit-Office Procedure: Patient instructed to return to the office for a follow up visit 3 months for continued evaluation and treatment.    Gardiner Barefoot DPM

## 2018-10-25 ENCOUNTER — Ambulatory Visit (INDEPENDENT_AMBULATORY_CARE_PROVIDER_SITE_OTHER): Payer: Medicare HMO | Admitting: Family Medicine

## 2018-10-25 ENCOUNTER — Encounter: Payer: Self-pay | Admitting: Family Medicine

## 2018-10-25 ENCOUNTER — Other Ambulatory Visit: Payer: Self-pay

## 2018-10-25 VITALS — BP 108/68 | HR 70 | Temp 97.0°F | Ht 63.0 in | Wt 193.1 lb

## 2018-10-25 DIAGNOSIS — F411 Generalized anxiety disorder: Secondary | ICD-10-CM

## 2018-10-25 DIAGNOSIS — M79642 Pain in left hand: Secondary | ICD-10-CM | POA: Diagnosis not present

## 2018-10-25 DIAGNOSIS — M79641 Pain in right hand: Secondary | ICD-10-CM | POA: Diagnosis not present

## 2018-10-25 DIAGNOSIS — R252 Cramp and spasm: Secondary | ICD-10-CM

## 2018-10-25 DIAGNOSIS — R69 Illness, unspecified: Secondary | ICD-10-CM | POA: Diagnosis not present

## 2018-10-25 MED ORDER — ALPRAZOLAM 0.5 MG PO TABS: ORAL_TABLET | ORAL | 2 refills | Status: DC

## 2018-10-25 MED ORDER — BACLOFEN 5 MG PO TABS: 5.0000 mg | ORAL_TABLET | Freq: Four times a day (QID) | ORAL | 1 refills | Status: DC | PRN

## 2018-10-25 NOTE — Progress Notes (Signed)
Musculoskeletal Exam  Patient: Kristina Patel DOB: 11/03/1945  DOS: 10/25/2018  SUBJECTIVE:  Chief Complaint:   Chief Complaint  Patient presents with  . both hands locking up for the past 3 months    Kristina Patel is a 73 y.o.  female for evaluation and treatment of hand pain.   Onset:  3 months ago. No inj or change in activity.  Location: 2nd and 3rd digits are locking up Lasts for roughly 5 min, this happens on a daily basis; interfering with her ADLs.  Fine movements seem to exacerbate them.  Character:  aching  Progression of issue:  has worsened Her husband massaging her back seems to make things better.  Saw rheum and told it was not related to RA.  Treatment: to date has been massage and topical lidocaine and topical Voltaren without relief of the latter 2.    Hx of GAD. Was rx'd Xanax by her neurosurgeon who just retired. Wondering if I would take over. Takes intermittently, but mainly for sleep.   ROS: Musculoskeletal/Extremities: +hand locking Psych: +anxiety  Past Medical History:  Diagnosis Date  . Bacterial overgrowth syndrome   . Breast mass   . Carpal tunnel syndrome   . Colon polyps   . Degenerative disc disease   . Depression   . Fall   . GERD (gastroesophageal reflux disease)   . Gouty arthropathy   . Headache   . Hyperlipidemia   . Hypertension   . IBS (irritable bowel syndrome)    constipation  . Internal prolapsed hemorrhoids   . Iron deficiency anemia   . Melanoma ([place])   . Osteoarthritis   . Osteoporosis   . Ovarian cancer ([place])   . Pelvic floor dysfunction 05/05/2016  . Peripheral neuropathy   . Prediabetes   . Pulmonary nodules   . Renal disease   . Rheumatoid arthritis(714.0)   . Squamous cell carcinoma skin of abdomen   . Uterine cancer ([place])   . Vitamin D deficiency     Objective: VITAL SIGNS: BP 108/68 (BP Location: Left Arm, Patient Position: Sitting, Cuff Size: Normal)   Pulse 70   Temp (!) 97 F (36.1 C)  (Temporal)   Ht 5\' 3"  (1.6 m)   Wt 193 lb 2 oz (87.6 kg)   SpO2 98%   BMI 34.21 kg/m  Constitutional: Well formed, well developed. No acute distress. Cardiovascular: Brisk cap refill Thorax & Lungs: No accessory muscle use Musculoskeletal: b/l hands.   Tenderness to palpation: +ttp over mcp and PIPs Ecchymosis: no Tests negative: Spurling's Neurologic: Normal sensory function. No focal deficits noted.  DTR's equal and symmetric in UE's.  Psychiatric: Normal mood. Age appropriate judgment and insight. Alert & oriented x 3.    Assessment:  Bilateral hand pain - Plan: Ambulatory referral to Hand Surgery  Hand or foot spasms - Plan: Ambulatory referral to Hand Surgery  GAD (generalized anxiety disorder)  Plan: 1/2: will get opinion of hand team, given b/l distribution, would question neurologic issue. Trial Baclofen as opposed to Robaxin to see if that helps. She was unable to reproduce s/s's w hand movements today. Stress levels have been normal. Could consider Neuro referral vs OT. 3: Will take over alprazolam rx's, will have her sign csc and give uds at next visit. F/u in 6 mo or prn. The patient voiced understanding and agreement to the plan.   [place], DO 10/25/18  10:26 AM

## 2018-10-25 NOTE — Patient Instructions (Signed)
We are leaving the methocarbamol on your list. Do not take this while on the new muscle relaxant (Baclofen).   Keep making [name] massage your neck/shoulders.  Heat (pad or rice pillow in microwave) over affected area, 10-15 minutes twice daily.   If you do not hear anything about your referral in the next 1-2 weeks, call our office and ask for an update.  Let us know if you need anything.

## 2018-11-02 ENCOUNTER — Encounter: Payer: Self-pay | Admitting: Family Medicine

## 2018-11-02 DIAGNOSIS — M75102 Unspecified rotator cuff tear or rupture of left shoulder, not specified as traumatic: Secondary | ICD-10-CM | POA: Diagnosis not present

## 2018-11-02 DIAGNOSIS — M7502 Adhesive capsulitis of left shoulder: Secondary | ICD-10-CM | POA: Diagnosis not present

## 2018-11-03 ENCOUNTER — Telehealth: Payer: Self-pay | Admitting: Family Medicine

## 2018-11-03 NOTE — Telephone Encounter (Signed)
[facility] transferred call stating pt having a cough and needing appt. I spoke to pt and she notes she has developed a cough but is concerned that she has had swelling in her feet and legs. She then stated she has now started having swelling in her face, lips and tongue. Per multiple CMAs pt referred to ER. Pt displayed understanding.

## 2018-11-06 NOTE — Telephone Encounter (Signed)
Please advise if pt can come in for OV. Friday she was referred to ER for symptoms of cough, swelling of legs, feet, face, tongue, and mouth.   Copied from [place] [id number]. Topic: Appointment Scheduling - Scheduling Inquiry for Clinic >> Nov 06, 2018 10:11 AM Pauline Good wrote: Reason for CRM: pt calling and need an inoffice visit for 9.16.20 per Dr Nani Ravens MyChart message for cough

## 2018-11-06 NOTE — Telephone Encounter (Signed)
Spoke with patient and she did go to the [place] ER and they did nothing.  She has been tested for Covid and will bring results with her.  appt made for 11/08/18 at 315.

## 2018-11-08 ENCOUNTER — Other Ambulatory Visit: Payer: Self-pay

## 2018-11-08 ENCOUNTER — Encounter: Payer: Self-pay | Admitting: Family Medicine

## 2018-11-08 ENCOUNTER — Ambulatory Visit (INDEPENDENT_AMBULATORY_CARE_PROVIDER_SITE_OTHER): Payer: Medicare HMO | Admitting: Family Medicine

## 2018-11-08 VITALS — BP 120/72 | HR 91 | Temp 96.8°F | Ht 63.0 in | Wt 190.0 lb

## 2018-11-08 DIAGNOSIS — R053 Chronic cough: Secondary | ICD-10-CM | POA: Insufficient documentation

## 2018-11-08 DIAGNOSIS — R05 Cough: Secondary | ICD-10-CM | POA: Diagnosis not present

## 2018-11-08 MED ORDER — MOMETASONE FUROATE 50 MCG/ACT NA SUSP: 2.0000 | Freq: Every day | NASAL | 3 refills | Status: DC

## 2018-11-08 MED ORDER — LEVOCETIRIZINE DIHYDROCHLORIDE 5 MG PO TABS: 5.0000 mg | ORAL_TABLET | Freq: Every evening | ORAL | 5 refills | Status: DC

## 2018-11-08 NOTE — Progress Notes (Signed)
Chief Complaint  Patient presents with  . Cough  . Edema    ankles    Kristina Patel is 73 y.o. and is here for a cough.  She has been managed through the [place] thus far.  She would like to get a second opinion through private practice.   Duration: 9 months Productive? Yes- clear Associated symptoms: wheezing, chest wall pain, feels like post nasal drainage.  Denies: fever, night sweats, hemoptysis, dyspnea ACEi? No   Pulm- inhalers trialed, CT chest neg, PFTs neg.   Allergist- Put her on Lyrica which she has not tried yet. No testing as of yet. She is not on anything for her allergies.   GI- saw GI, had barium swallow, endoscopy, trialed on various PPI's without relief.  ENT- neg upper scope.  Cardiac- saw cardio, did an echo that was normal. No other management at this time.  ROS:  Resp: +Cough Cardio: No chest pain  Past Medical History:  Diagnosis Date  . Bacterial overgrowth syndrome   . Breast mass   . Carpal tunnel syndrome   . Colon polyps   . Degenerative disc disease   . Depression   . Fall   . GERD (gastroesophageal reflux disease)   . Gouty arthropathy   . Headache   . Hyperlipidemia   . Hypertension   . IBS (irritable bowel syndrome)    constipation  . Internal prolapsed hemorrhoids   . Iron deficiency anemia   . Melanoma ([place])   . Osteoarthritis   . Osteoporosis   . Ovarian cancer ([place])   . Pelvic floor dysfunction 05/05/2016  . Peripheral neuropathy   . Prediabetes   . Pulmonary nodules   . Renal disease   . Rheumatoid arthritis(714.0)   . Squamous cell carcinoma skin of abdomen   . Uterine cancer ([place])   . Vitamin D deficiency    Family History  Problem Relation Age of Onset  . Alzheimer's disease Mother   . Colon cancer Mother 51  . Lung cancer Mother   . Dementia Mother   . Melanoma Father   . Non-Hodgkin's lymphoma Father   . Heart disease Maternal Grandmother   . Colon cancer Maternal Grandfather   . Colon cancer Paternal  Grandmother   . Colon cancer Paternal Grandfather      BP 120/72 (BP Location: Left Arm, Patient Position: Sitting, Cuff Size: Normal)   Pulse 91   Temp (!) 96.8 F (36 C) (Temporal)   Ht 5\' 3"  (1.6 m)   Wt 190 lb (86.2 kg)   SpO2 94%   BMI 33.66 kg/m  Gen: Awake, alert, appears stated age HEENT: Ears neg, nares patent without D/C, turbinates unremarkable, Pharynx pink without exudate Neck: Supple, no masses or asymmetry, no tenderness Heart: RRR, no murmurs, no LE edema Lungs: CTAB, normal effort, no accessory muscle use Psych: Age appropriate judgement and insight, normal mood and affect  Chronic cough - Plan: levocetirizine (XYZAL) 5 MG tablet, mometasone (NASONEX) 50 MCG/ACT nasal spray, Ambulatory referral to Allergy  Suspect upper airway cough syndrome at this point. Start INCS and PO antihistamine. Refer to [facility] allergy team.  F/u in 4 weeks if unable to get in w allergy team, consider Singulair at that time.  The patient voiced understanding and agreement to the plan.  [place], DO 11/08/18 5:16 PM

## 2018-11-08 NOTE — Patient Instructions (Addendum)
If you do not hear anything about your referral in the next 1-2 weeks, call our office and ask for an update.  Claritin (loratadine), Allegra (fexofenadine), Zyrtec (cetirizine) which is also equivalent to Xyzal (levocetirizine); these are listed in order from weakest to strongest. Generic, and therefore cheaper, options are in the parentheses.   Flonase (fluticasone); nasal spray that is over the counter. 2 sprays each nostril, once daily. Aim towards the same side eye when you spray.  There are available OTC, and the generic versions, which may be cheaper, are in parentheses. Show this to a pharmacist if you have trouble finding any of these items.  Cancel appt w me if you get in with allergist before 1 month.  Let us know if you need anything.

## 2018-11-09 ENCOUNTER — Telehealth: Payer: Self-pay | Admitting: Family Medicine

## 2018-11-09 NOTE — Telephone Encounter (Signed)
Rapid Covid-19 test neg on 10/23/2018. Covid-19 Ab test neg on 10/17/2018. Hep B surf Ag neg on 10/17/2018. ANA, IFA neg on 09/26/2018.

## 2018-11-16 ENCOUNTER — Other Ambulatory Visit: Payer: Self-pay

## 2018-11-16 ENCOUNTER — Encounter: Payer: Self-pay | Admitting: Allergy and Immunology

## 2018-11-16 ENCOUNTER — Ambulatory Visit: Payer: Medicare HMO | Admitting: Allergy and Immunology

## 2018-11-16 DIAGNOSIS — R053 Chronic cough: Secondary | ICD-10-CM

## 2018-11-16 DIAGNOSIS — K9049 Malabsorption due to intolerance, not elsewhere classified: Secondary | ICD-10-CM | POA: Diagnosis not present

## 2018-11-16 DIAGNOSIS — J310 Chronic rhinitis: Secondary | ICD-10-CM | POA: Insufficient documentation

## 2018-11-16 DIAGNOSIS — R05 Cough: Secondary | ICD-10-CM

## 2018-11-16 MED ORDER — PREGABALIN 50 MG PO CAPS: ORAL_CAPSULE | ORAL | 3 refills | Status: DC

## 2018-11-16 MED ORDER — AZELASTINE-FLUTICASONE 137-50 MCG/ACT NA SUSP: NASAL | 5 refills | Status: DC

## 2018-11-16 NOTE — Patient Instructions (Addendum)
Cough, persistent The most common causes of chronic cough include the following: upper airway cough syndrome (UACS) which is caused by variety of rhinosinus conditions; asthma; gastroesophageal reflux disease (GERD); chronic bronchitis from cigarette smoking or other inhaled environmental irritants; non-asthmatic eosinophilic bronchitis; and bronchiectasis. In prospective studies, these conditions have accounted for up to 94% of the causes of chronic cough in immunocompetent adults.  As her pulmonologist and gastroenterologist have ruled out pulmonary and GI etiologies respectively, we will proceed with a therapeutic trial of pregabalin for possible neurogenic cough (patient is unable to take gabapentin due to side effects in the past).  Postnasal drainage will be addressed as well. A prescription has been provided for a flutter valve to be used as needed to break the coughing cycle.  A prescription has been provided for pregabalin (Lyrica). Start with 50 mg twice daily. If cough persists, increase gradually to a dose of 150 mg twice a day.   Consider discontinuing losartan (ACE inhibitors and ARBs should be avoided for now until the etiology of the cough has been determined).  Avoid any foods which seem to exacerbate the cough.  Nonallergic rhinitis All environmental allergen skin test were negative today despite a positive histamine control.  A prescription has been provided for azelastine/fluticasone nasal spray, 1 spray per nostril twice daily as needed. Proper nasal spray technique has been discussed and demonstrated.  Nasal saline lavage (NeilMed) has been recommended as needed and prior to medicated nasal sprays along with instructions for proper administration.  For thick post nasal drainage, add guaifenesin [phone] mg (Mucinex)  twice daily as needed with adequate hydration as discussed.  Intolerance, food All food allergen skin tests were negative today despite a positive histamine  control.  Food allergen skin testing has excellent negative predictive value, however does not rule out food intolerance.  Food intolerance is suggested when elimination of the responsible food leads to symptom resolution and re-introduction of the food is followed by the return of symptoms.   The patient has been encouraged to keep a careful symptom/food journal and eliminate any food suspected of correlating with symptoms. Should symptoms concerning for anaphylaxis arise, 911 is to be called immediately.

## 2018-11-16 NOTE — Assessment & Plan Note (Addendum)
The most common causes of chronic cough include the following: upper airway cough syndrome (UACS) which is caused by variety of rhinosinus conditions; asthma; gastroesophageal reflux disease (GERD); chronic bronchitis from cigarette smoking or other inhaled environmental irritants; non-asthmatic eosinophilic bronchitis; and bronchiectasis. In prospective studies, these conditions have accounted for up to 94% of the causes of chronic cough in immunocompetent adults.  As her pulmonologist and gastroenterologist have ruled out pulmonary and GI etiologies respectively, we will proceed with a therapeutic trial of pregabalin for possible neurogenic cough (patient is unable to take gabapentin due to side effects in the past).  Postnasal drainage will be addressed as well. A prescription has been provided for a flutter valve to be used as needed to break the coughing cycle.  A prescription has been provided for pregabalin (Lyrica). Start with 50 mg twice daily. If cough persists, increase gradually to a dose of 150 mg twice a day.   Consider discontinuing losartan (ACE inhibitors and ARBs should be avoided for now until the etiology of the cough has been determined).  Avoid any foods which seem to exacerbate the cough.

## 2018-11-16 NOTE — Progress Notes (Signed)
New Patient Note  RE: Kristina Patel MRN: [id number] DOB: 17-Feb-1946 Date of Office Visit: 11/16/2018  Referring provider: Shelda Pal* Primary care provider: Shelda Pal, DO  Chief Complaint: Cough   History of present illness: Kristina Patel is a 73 y.o. female seen today in consultation requested by Shelda Pal, DO.  She is accompanied today by her husband who assists with the history.  She complains of a persistent cough since January 2020.  She had apparently had a mild cough in fall 2019, however the cough resolved for a few months prior to recurring in January.  She has been evaluated by an allergist at the [facility], an ENT at the [facility], and a gastroenterologist at the [facility].  Pulmonary function tests were unrevealing of an etiology.  She reports that her swallow study was negative and GI determined that her acid reflux is well controlled and is not causing/contributing to the problem.  She reports that prednisone provided no benefit and she had to discontinue the course early because of side effects.  Spiriva provided no perceived benefit.  Symbicort "made it worse."  Tessalon Perles have provided no benefit.  She and her husband believe that the only medication that has provided significant, though temporary, relief is Dilaudid which she took for joint pain. She has no significant history of allergic rhinitis or allergic conjunctivitis, however does experience some postnasal drainage.  She reports that her "throat stays so scratchy", however she is uncertain if this is because of postnasal drainage or merely the persistent cough.   She notes that the consumption of chocolate and "granular breads", including multi grain breads exacerbate the cough.  In fact, the cough gets worse is soon as she puts chocolate in her mouth prior to swallowing. She reports that over the past few weeks she has experienced ocular pruritus, watery eyes, and puffy eyelids.  In addition,  she believes that she has experienced mild swelling of the nose and facial cheeks and complains that her "tongue burns."  She also reports that her lower extremities are mildly swollen.  She states that "everything is going bananas."  Assessment and plan: Cough, persistent The most common causes of chronic cough include the following: upper airway cough syndrome (UACS) which is caused by variety of rhinosinus conditions; asthma; gastroesophageal reflux disease (GERD); chronic bronchitis from cigarette smoking or other inhaled environmental irritants; non-asthmatic eosinophilic bronchitis; and bronchiectasis. In prospective studies, these conditions have accounted for up to 94% of the causes of chronic cough in immunocompetent adults.  As her pulmonologist and gastroenterologist have ruled out pulmonary and GI etiologies respectively, we will proceed with a therapeutic trial of pregabalin for possible neurogenic cough (patient is unable to take gabapentin due to side effects in the past).  Postnasal drainage will be addressed as well. A prescription has been provided for a flutter valve to be used as needed to break the coughing cycle.  A prescription has been provided for pregabalin (Lyrica). Start with 50 mg twice daily. If cough persists, increase gradually to a dose of 150 mg twice a day.   Consider discontinuing losartan (ACE inhibitors and ARBs should be avoided for now until the etiology of the cough has been determined).  Avoid any foods which seem to exacerbate the cough.  Nonallergic rhinitis All environmental allergen skin test were negative today despite a positive histamine control.  A prescription has been provided for azelastine/fluticasone nasal spray, 1 spray per nostril twice daily as needed. Proper  nasal spray technique has been discussed and demonstrated.  Nasal saline lavage (NeilMed) has been recommended as needed and prior to medicated nasal sprays along with instructions for  proper administration.  For thick post nasal drainage, add guaifenesin [phone] mg (Mucinex)  twice daily as needed with adequate hydration as discussed.  Intolerance, food All food allergen skin tests were negative today despite a positive histamine control.  Food allergen skin testing has excellent negative predictive value, however does not rule out food intolerance.  Food intolerance is suggested when elimination of the responsible food leads to symptom resolution and re-introduction of the food is followed by the return of symptoms.   The patient has been encouraged to keep a careful symptom/food journal and eliminate any food suspected of correlating with symptoms. Should symptoms concerning for anaphylaxis arise, 911 is to be called immediately.   Meds ordered this encounter  Medications  . pregabalin (LYRICA) 50 MG capsule    Sig: Take one twice daily. If cough persists increase gradually to a dose of    Dispense:  256 capsule    Refill:  3  . Azelastine-Fluticasone 137-50 MCG/ACT SUSP    Sig: 1 spray per nostril twice daily as needed.    Dispense:  23 g    Refill:  5    Diagnostics: Spirometry:  Normal with an FEV1 of 99% predicted.  This study was performed while the patient was symptomatic.  Please see scanned spirometry results for details.   Environmental skin testing: Negative despite a positive histamine control. Food allergen skin testing: Negative despite a positive histamine control.    Physical examination: Blood pressure 120/78, pulse 77, temperature 99.1 F (37.3 C), temperature source Oral, resp. rate 20, height 5' 1.5" (1.562 m), weight 184 lb 15.5 oz (83.9 kg), SpO2 98 %.  General: Alert, interactive, in no acute distress. HEENT: TMs pearly gray, turbinates mildly edematous without discharge, post-pharynx erythematous. Neck: Supple without lymphadenopathy. Lungs: Clear to auscultation without wheezing, rhonchi or rales. CV: Normal S1, S2 without murmurs.  Abdomen: Nondistended, nontender. Skin: Warm and dry, without lesions or rashes. Extremities:  No clubbing, cyanosis or edema. Neuro:   Grossly intact.  Review of systems:  Review of systems negative except as noted in HPI / PMHx or noted below: Review of Systems  Constitutional: Negative.   HENT: Negative.   Eyes: Negative.   Respiratory: Negative.   Cardiovascular: Negative.   Gastrointestinal: Negative.   Genitourinary: Negative.   Musculoskeletal: Negative.   Skin: Negative.   Neurological: Negative.   Endo/Heme/Allergies: Negative.   Psychiatric/Behavioral: Negative.     Past medical history:  Past Medical History:  Diagnosis Date  . Bacterial overgrowth syndrome   . Breast mass   . Carpal tunnel syndrome   . Colon polyps   . Degenerative disc disease   . Depression   . Fall   . GERD (gastroesophageal reflux disease)   . Gouty arthropathy   . Headache   . Hyperlipidemia   . Hypertension   . IBS (irritable bowel syndrome)    constipation  . Internal prolapsed hemorrhoids   . Iron deficiency anemia   . Melanoma ([place])   . Osteoarthritis   . Osteoporosis   . Ovarian cancer ([place])   . Pelvic floor dysfunction 05/05/2016  . Peripheral neuropathy   . Prediabetes   . Pulmonary nodules   . Renal disease   . Rheumatoid arthritis(714.0)   . Squamous cell carcinoma skin of abdomen   . Uterine cancer ([place])   .  Vitamin D deficiency     Past surgical history:  Past Surgical History:  Procedure Laterality Date  . ABDOMINAL HYSTERECTOMY     partial  . ADENOIDECTOMY    . ANAL RECTAL MANOMETRY N/A 04/30/2016   Procedure: ANO RECTAL MANOMETRY;  Surgeon: Mauri Pole, MD;  Location: [facility];  Service: Endoscopy;  Laterality: N/A;  . APPENDECTOMY    . BREAST SURGERY Bilateral    benign fatty tumor removed  . CARPAL TUNNEL RELEASE Right   . CERVICAL SPINE SURGERY    . CHOLECYSTECTOMY    . COLONOSCOPY    . KNEE ARTHROPLASTY Right   . LUMBAR DISC SURGERY     . MASTECTOMY, PARTIAL    . MELANOMA EXCISION    . OPERATIVE HYSTEROSCOPY    . TONSILLECTOMY      Family history: Family History  Problem Relation Age of Onset  . Alzheimer's disease Mother   . Colon cancer Mother 63  . Lung cancer Mother   . Dementia Mother   . Melanoma Father   . Non-Hodgkin's lymphoma Father   . Heart disease Maternal Grandmother   . Colon cancer Maternal Grandfather   . Colon cancer Paternal Grandmother   . Colon cancer Paternal Grandfather     Social history: Social History   Socioeconomic History  . Marital status: Married    Spouse name: [name]  . Number of children: 2  . Years of education: 45  . Highest education level: Not on file  Occupational History  . Occupation: disabled  Social Needs  . Financial resource strain: Not on file  . Food insecurity    Worry: Not on file    Inability: Not on file  . Transportation needs    Medical: Not on file    Non-medical: Not on file  Tobacco Use  . Smoking status: Never Smoker  . Smokeless tobacco: Never Used  Substance and Sexual Activity  . Alcohol use: No  . Drug use: No  . Sexual activity: Not Currently  Lifestyle  . Physical activity    Days per week: Not on file    Minutes per session: Not on file  . Stress: Not on file  Relationships  . Social Herbalist on phone: Not on file    Gets together: Not on file    Attends religious service: Not on file    Active member of club or organization: Not on file    Attends meetings of clubs or organizations: Not on file    Relationship status: Not on file  . Intimate partner violence    Fear of current or ex partner: Not on file    Emotionally abused: Not on file    Physically abused: Not on file    Forced sexual activity: Not on file  Other Topics Concern  . Not on file  Social History Narrative   Patient is married [name]) and lives with her husband.   Patient has two children.   Patient is retired.   Patient has a college  education.   Patient is right handed.   Patient drinks 3-4 cups of coffee and tea daily.   [employer] History: The patient lives in a house with carpeting in the bedroom, gas heat, and central air.  There is no known mold/water damage in the home.  There are no pets in the home.  She is a non-smoker.   Current Outpatient Medications  Medication Sig Dispense Refill  .  ACIDOPHILUS LACTOBACILLUS PO Take 2 tablets by mouth 3 (three) times daily with meals.     . Adalimumab (HUMIRA) 40 MG/0.4ML PSKT Inject into the skin.    Marland Kitchen ALPRAZolam (XANAX) 0.5 MG tablet TK 1 T PO Q 12 H PRN 60 tablet 2  . ASPIRIN 81 PO Take 81 mg by mouth daily.    Marland Kitchen aspirin-acetaminophen-caffeine (EXCEDRIN MIGRAINE) 250-250-65 MG tablet Take 1 tablet by mouth daily as needed for headache or migraine.    Marland Kitchen atorvastatin (LIPITOR) 10 MG tablet Take 10 mg by mouth daily as needed.    . Azelastine-Fluticasone 137-50 MCG/ACT SUSP 1 spray per nostril twice daily as needed. 23 g 5  . Biotin 5000 MCG TABS Take 5,000 mcg by mouth daily.    . Cholecalciferol (D2000 ULTRA STRENGTH) 50 MCG (2000 UT) CAPS Take by mouth.    . cyclobenzaprine (FLEXERIL) 10 MG tablet Take 10 mg by mouth at bedtime.     Marland Kitchen denosumab (PROLIA) 60 MG/ML SOSY injection Inject 60 mg into the skin every 6 (six) months. Last injection July 2019    . famotidine (PEPCID) 20 MG tablet Take 20 mg by mouth at bedtime.    . Flaxseed, Linseed, (FLAXSEED OIL) 1000 MG CAPS Take 1,000 mg by mouth daily with supper.     Marland Kitchen glipiZIDE (GLUCOTROL) 5 MG tablet Take 2.5 mg by mouth 2 (two) times daily.    Marland Kitchen lidocaine (LIDODERM) 5 % Place 1 patch onto the skin daily as needed (pain). Remove & Discard patch within 12 hours or as directed by MD    . lidocaine (XYLOCAINE) 5 % ointment Apply 1 application topically as needed.    Marland Kitchen losartan (COZAAR) 50 MG tablet Take 50 mg by mouth daily.    . methocarbamol (ROBAXIN) 500 MG tablet Take 500 mg by mouth 2 (two) times  daily as needed for muscle spasms.    . metoprolol (LOPRESSOR) 50 MG tablet Take 1 tablet (50 mg total) by mouth 2 (two) times daily. 60 tablet 2  . Multiple Vitamin (MULTIVITAMIN WITH MINERALS) TABS tablet Take 1 tablet by mouth daily with supper.    . pantoprazole (PROTONIX) 40 MG tablet Take 40 mg by mouth daily.    . pregabalin (LYRICA) 50 MG capsule Take one twice daily. If cough persists increase gradually to a dose of 256 capsule 3  . pseudoephedrine (SUDAFED) 30 MG tablet Take 30 mg by mouth every 6 (six) hours as needed for congestion.    . topiramate (TOPAMAX) 25 MG tablet Take 2 tablets (50 mg total) by mouth 2 (two) times daily. 120 tablet 3  . TURMERIC PO Take 900 mg by mouth 3 (three) times daily with meals.    . vitamin C (ASCORBIC ACID) 500 MG tablet Take 500 mg by mouth 2 (two) times daily.    Marland Kitchen acetaminophen (TYLENOL) 325 MG tablet Take 325 mg by mouth every 6 (six) hours as needed for headache (pain).    . baclofen (LIORESAL) 10 MG tablet Take 10 mg by mouth 3 (three) times daily.    . budesonide-formoterol (SYMBICORT) 160-4.5 MCG/ACT inhaler Inhale 2 puffs into the lungs 2 (two) times daily.    . cyanocobalamin ([employer] VITAMIN B12) 1000 MCG tablet Take 1,000 mcg by mouth daily.    . diclofenac sodium (VOLTAREN) 1 % GEL Apply topically 4 (four) times daily.    Marland Kitchen levocetirizine (XYZAL) 5 MG tablet Take 5 mg by mouth every evening.    . lidocaine (XYLOCAINE) 2 %  jelly 1 application as needed.    . mometasone (ELOCON) 0.1 % cream Apply 1 application topically daily.    . mometasone (NASONEX) 50 MCG/ACT nasal spray Place 2 sprays into the nose daily.    . Tiotropium Bromide Monohydrate (SPIRIVA RESPIMAT) 2.5 MCG/ACT AERS Inhale into the lungs.    . valsartan (DIOVAN) 160 MG tablet Take 160 mg by mouth daily.     No current facility-administered medications for this visit.      Known medication allergies: Allergies  Allergen Reactions  . Butalbital-Asa-Caff-Codeine  [Fiorinal-Codeine] Anaphylaxis and Swelling  . Darvon Other (See Comments)    Irritable & hyper  . Fiorinal [Butalbital-Aspirin-Caffeine] Anaphylaxis  . Penicillins Anaphylaxis    Has patient had a PCN reaction causing immediate rash, facial/tongue/throat swelling, SOB or lightheadedness with hypotension: Yes Has patient had a PCN reaction causing severe rash involving mucus membranes or skin necrosis: No Has patient had a PCN reaction that required hospitalization: No Has patient had a PCN reaction occurring within the last 10 years: No If all of the above answers are "NO", then may proceed with Cephalosporin use.  . Amitriptyline Other (See Comments)    hyperactivity  . Ativan [Lorazepam] Other (See Comments)    Adverse reaction...made patient "act out" - out of her mind  . Bactrim [Sulfamethoxazole-Trimethoprim] Diarrhea, Nausea And Vomiting and Other (See Comments)    Severe cramps  . Ciprofloxacin Hcl Nausea And Vomiting and Other (See Comments)    hallucinations  . Tape Other (See Comments)    Allergic to plastic tape. The adhesive causes redness to skin as well as loss of skin. - please use paper tape  . Ambien [Zolpidem Tartrate] Other (See Comments)    Sleep walking  . Benzodiazepines Cough  . Propoxyphene Rash and Other (See Comments)    Irritable and hyperactive    I appreciate the opportunity to take part in Leala's care. Please do not hesitate to contact me with questions.  Sincerely,   R. Edgar Frisk, MD

## 2018-11-16 NOTE — Assessment & Plan Note (Signed)
All environmental allergen skin test were negative today despite a positive histamine control.  A prescription has been provided for azelastine/fluticasone nasal spray, 1 spray per nostril twice daily as needed. Proper nasal spray technique has been discussed and demonstrated.  Nasal saline lavage (NeilMed) has been recommended as needed and prior to medicated nasal sprays along with instructions for proper administration.  For thick post nasal drainage, add guaifenesin [phone] mg (Mucinex)  twice daily as needed with adequate hydration as discussed.

## 2018-11-16 NOTE — Assessment & Plan Note (Signed)
All food allergen skin tests were negative today despite a positive histamine control.  Food allergen skin testing has excellent negative predictive value, however does not rule out food intolerance.  Food intolerance is suggested when elimination of the responsible food leads to symptom resolution and re-introduction of the food is followed by the return of symptoms.   The patient has been encouraged to keep a careful symptom/food journal and eliminate any food suspected of correlating with symptoms. Should symptoms concerning for anaphylaxis arise, 911 is to be called immediately.

## 2018-11-21 ENCOUNTER — Encounter: Payer: Self-pay | Admitting: Allergy and Immunology

## 2018-11-21 DIAGNOSIS — M75102 Unspecified rotator cuff tear or rupture of left shoulder, not specified as traumatic: Secondary | ICD-10-CM | POA: Diagnosis not present

## 2018-11-21 DIAGNOSIS — M7502 Adhesive capsulitis of left shoulder: Secondary | ICD-10-CM | POA: Diagnosis not present

## 2018-12-06 ENCOUNTER — Encounter: Payer: Self-pay | Admitting: Family Medicine

## 2018-12-06 ENCOUNTER — Ambulatory Visit (INDEPENDENT_AMBULATORY_CARE_PROVIDER_SITE_OTHER): Payer: Medicare HMO | Admitting: Family Medicine

## 2018-12-06 ENCOUNTER — Other Ambulatory Visit: Payer: Self-pay

## 2018-12-06 VITALS — BP 122/80 | HR 74 | Temp 97.5°F | Ht 63.0 in | Wt 194.0 lb

## 2018-12-06 DIAGNOSIS — R600 Localized edema: Secondary | ICD-10-CM

## 2018-12-06 DIAGNOSIS — R053 Chronic cough: Secondary | ICD-10-CM

## 2018-12-06 DIAGNOSIS — R05 Cough: Secondary | ICD-10-CM

## 2018-12-06 LAB — COMPREHENSIVE METABOLIC PANEL
ALT: 21 U/L (ref 0–35)
AST: 20 U/L (ref 0–37)
Albumin: 4.5 g/dL (ref 3.5–5.2)
Alkaline Phosphatase: 86 U/L (ref 39–117)
BUN: 19 mg/dL (ref 6–23)
CO2: 21 mEq/L (ref 19–32)
Calcium: 9.7 mg/dL (ref 8.4–10.5)
Chloride: 110 mEq/L (ref 96–112)
Creatinine, Ser: 1.18 mg/dL (ref 0.40–1.20)
GFR: 45 mL/min — ABNORMAL LOW (ref >60.00)
Glucose, Bld: 101 mg/dL — ABNORMAL HIGH (ref 70–99)
Potassium: 4.4 mEq/L (ref 3.5–5.1)
Sodium: 141 mEq/L (ref 135–145)
Total Bilirubin: 0.3 mg/dL (ref 0.2–1.2)
Total Protein: 7.1 g/dL (ref 6.0–8.3)

## 2018-12-06 LAB — TSH: TSH: 1.38 u[IU]/mL (ref 0.35–4.50)

## 2018-12-06 LAB — T4, FREE: Free T4: 0.6 ng/dL (ref 0.60–1.60)

## 2018-12-06 MED ORDER — MONTELUKAST SODIUM 10 MG PO TABS: 10.0000 mg | ORAL_TABLET | Freq: Every day | ORAL | 3 refills | Status: DC

## 2018-12-06 NOTE — Patient Instructions (Addendum)
Give [place] 2-3 business days to get the results of your labs back.   For the swelling in your lower extremities, be sure to elevate your legs when able, mind the salt intake, stay physically active and consider wearing compression stockings.  If the swelling in face and itchy eyes do not get better, call Dr. Verlin Fester.   Let us know if you need anything.

## 2018-12-06 NOTE — Progress Notes (Signed)
Chief Complaint  Patient presents with  . Follow-up    cough  . Edema    Kristina Patel here for bilateral leg swelling.  Duration: 4 weeks Hx of prolonged bedrest, recent surgery, travel or injury? No Pain the calf? No SOB? No Personal or family history of clot or bleeding disorder? No Hx of heart failure, renal failure, hepatic failure? No  [facility] showing grade 1 diastolic dysfunction, otherwise nml. She does have compression stockings, but has not been wearing them.  Cont's to have chronic cough. This has been going on for 10 mo now. Has had an extensive workup. 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 No findings. She was rx'd Lyrica but is hesitant to take. She has been referred to pulm by her GI provider and is awaiting an appt. Continuing on Xyzal and her various inhalers. Reports she was on pred recently and that made her cough worse. CT chest recently showed pulm nodules also.   ROS:  MSK- +leg swelling, no pain Lungs- no SOB  Past Medical History:  Diagnosis Date  . Bacterial overgrowth syndrome   . Breast mass   . Carpal tunnel syndrome   . Colon polyps   . Degenerative disc disease   . Depression   . Fall   . GERD (gastroesophageal reflux disease)   . Gouty arthropathy   . Headache   . Hyperlipidemia   . Hypertension   . IBS (irritable bowel syndrome)    constipation  . Internal prolapsed hemorrhoids   . Iron deficiency anemia   . Melanoma ([place])   . Osteoarthritis   . Osteoporosis   . Ovarian cancer ([place])   . Pelvic floor dysfunction 05/05/2016  . Peripheral neuropathy   . Prediabetes   . Pulmonary nodules   . Renal disease   . Rheumatoid arthritis(714.0)   . Squamous cell carcinoma skin of abdomen   . Uterine cancer ([place])   . Vitamin D deficiency    Family History  Problem Relation Age of Onset  . Alzheimer's disease Mother   . Colon cancer Mother 36  . Lung cancer Mother   .  Dementia Mother   . Melanoma Father   . Non-Hodgkin's lymphoma Father   . Heart disease Maternal Grandmother   . Colon cancer Maternal Grandfather   . Colon cancer Paternal Grandmother   . Colon cancer Paternal Grandfather    Past Surgical History:  Procedure Laterality Date  . ABDOMINAL HYSTERECTOMY     partial  . ADENOIDECTOMY    . ANAL RECTAL MANOMETRY N/A 04/30/2016   Procedure: ANO RECTAL MANOMETRY;  Surgeon: Mauri Pole, MD;  Location: [facility];  Service: Endoscopy;  Laterality: N/A;  . APPENDECTOMY    . BREAST SURGERY Bilateral    benign fatty tumor removed  . CARPAL TUNNEL RELEASE Right   . CERVICAL SPINE SURGERY    . CHOLECYSTECTOMY    . COLONOSCOPY    . KNEE ARTHROPLASTY Right   . LUMBAR DISC SURGERY    . MASTECTOMY, PARTIAL    . MELANOMA EXCISION    . OPERATIVE HYSTEROSCOPY    . TONSILLECTOMY      Current Outpatient Medications:  .  acetaminophen (TYLENOL) 325 MG tablet, Take 325 mg by mouth every 6 (six) hours as needed for headache (pain)., Disp: , Rfl:  .  ACIDOPHILUS LACTOBACILLUS PO, Take 2 tablets by mouth 3 (three) times daily with meals. ,  Disp: , Rfl:  .  Adalimumab (HUMIRA) 40 MG/0.4ML PSKT, Inject into the skin., Disp: , Rfl:  .  ALPRAZolam (XANAX) 0.5 MG tablet, TK 1 T PO Q 12 H PRN, Disp: 60 tablet, Rfl: 2 .  ASPIRIN 81 PO, Take 81 mg by mouth daily., Disp: , Rfl:  .  aspirin-acetaminophen-caffeine (EXCEDRIN MIGRAINE) 250-250-65 MG tablet, Take 1 tablet by mouth daily as needed for headache or migraine., Disp: , Rfl:  .  atorvastatin (LIPITOR) 10 MG tablet, Take 10 mg by mouth daily as needed., Disp: , Rfl:  .  Azelastine-Fluticasone 137-50 MCG/ACT SUSP, 1 spray per nostril twice daily as needed., Disp: 23 g, Rfl: 5 .  baclofen (LIORESAL) 10 MG tablet, Take 10 mg by mouth 3 (three) times daily., Disp: , Rfl:  .  Biotin 5000 MCG TABS, Take 5,000 mcg by mouth daily., Disp: , Rfl:  .  budesonide-formoterol (SYMBICORT) 160-4.5 MCG/ACT inhaler,  Inhale 2 puffs into the lungs 2 (two) times daily., Disp: , Rfl:  .  Cholecalciferol (D2000 ULTRA STRENGTH) 50 MCG (2000 UT) CAPS, Take by mouth., Disp: , Rfl:  .  cyanocobalamin ([employer] VITAMIN B12) 1000 MCG tablet, Take 1,000 mcg by mouth daily., Disp: , Rfl:  .  cyclobenzaprine (FLEXERIL) 10 MG tablet, Take 10 mg by mouth at bedtime. , Disp: , Rfl:  .  denosumab (PROLIA) 60 MG/ML SOSY injection, Inject 60 mg into the skin every 6 (six) months. Last injection July 2019, Disp: , Rfl:  .  diclofenac sodium (VOLTAREN) 1 % GEL, Apply topically 4 (four) times daily., Disp: , Rfl:  .  famotidine (PEPCID) 20 MG tablet, Take 20 mg by mouth at bedtime., Disp: , Rfl:  .  Flaxseed, Linseed, (FLAXSEED OIL) 1000 MG CAPS, Take 1,000 mg by mouth daily with supper. , Disp: , Rfl:  .  glipiZIDE (GLUCOTROL) 5 MG tablet, Take 2.5 mg by mouth 2 (two) times daily., Disp: , Rfl:  .  levocetirizine (XYZAL) 5 MG tablet, Take 5 mg by mouth every evening., Disp: , Rfl:  .  lidocaine (LIDODERM) 5 %, Place 1 patch onto the skin daily as needed (pain). Remove & Discard patch within 12 hours or as directed by MD, Disp: , Rfl:  .  lidocaine (XYLOCAINE) 2 % jelly, 1 application as needed., Disp: , Rfl:  .  lidocaine (XYLOCAINE) 5 % ointment, Apply 1 application topically as needed., Disp: , Rfl:  .  losartan (COZAAR) 50 MG tablet, Take 50 mg by mouth daily., Disp: , Rfl:  .  methocarbamol (ROBAXIN) 500 MG tablet, Take 500 mg by mouth 2 (two) times daily as needed for muscle spasms., Disp: , Rfl:  .  metoprolol (LOPRESSOR) 50 MG tablet, Take 1 tablet (50 mg total) by mouth 2 (two) times daily., Disp: 60 tablet, Rfl: 2 .  mometasone (ELOCON) 0.1 % cream, Apply 1 application topically daily., Disp: , Rfl:  .  mometasone (NASONEX) 50 MCG/ACT nasal spray, Place 2 sprays into the nose daily., Disp: , Rfl:  .  Multiple Vitamin (MULTIVITAMIN WITH MINERALS) TABS tablet, Take 1 tablet by mouth daily with supper., Disp: , Rfl:  .   pantoprazole (PROTONIX) 40 MG tablet, Take 40 mg by mouth daily., Disp: , Rfl:  .  pregabalin (LYRICA) 50 MG capsule, Take one twice daily. If cough persists increase gradually to a dose of, Disp: 256 capsule, Rfl: 3 .  pseudoephedrine (SUDAFED) 30 MG tablet, Take 30 mg by mouth every 6 (six) hours as needed for  congestion., Disp: , Rfl:  .  Tiotropium Bromide Monohydrate (SPIRIVA RESPIMAT) 2.5 MCG/ACT AERS, Inhale into the lungs., Disp: , Rfl:  .  topiramate (TOPAMAX) 25 MG tablet, Take 2 tablets (50 mg total) by mouth 2 (two) times daily., Disp: 120 tablet, Rfl: 3 .  TURMERIC PO, Take 900 mg by mouth 3 (three) times daily with meals., Disp: , Rfl:  .  valsartan (DIOVAN) 160 MG tablet, Take 160 mg by mouth daily., Disp: , Rfl:  .  vitamin C (ASCORBIC ACID) 500 MG tablet, Take 500 mg by mouth 2 (two) times daily., Disp: , Rfl:  .  montelukast (SINGULAIR) 10 MG tablet, Take 1 tablet (10 mg total) by mouth at bedtime., Disp: 30 tablet, Rfl: 3  BP 122/80 (BP Location: Left Arm, Patient Position: Sitting, Cuff Size: Normal)   Pulse 74   Temp (!) 97.5 F (36.4 C) (Temporal)   Ht 5\' 3"  (1.6 m)   Wt 194 lb (88 kg)   SpO2 94%   BMI 34.37 kg/m  Gen- awake, alert, appears stated age Heart- RRR, no bruits, +LE edema 1+ b/l up to prox 1/3 of tibia Lungs- CTAB, normal effort w/o accessory muscle use MSK- no calf pain Psych: Age appropriate judgment and insight  Bilateral lower extremity edema - Plan: Comprehensive metabolic panel, TSH, T4, free  Chronic cough - Plan: montelukast (SINGULAIR) 10 MG tablet  Likely dependent edema. Elevate legs when able, mind the salt intake, stay physically active and consider wearing compression stockings. Defer further management to pulm for cough. F/u prn. Pt voiced understanding and agreement to the plan.  [place], DO 12/06/18  2:03 PM

## 2018-12-07 ENCOUNTER — Other Ambulatory Visit: Payer: Self-pay | Admitting: Family Medicine

## 2018-12-07 DIAGNOSIS — R7989 Other specified abnormal findings of blood chemistry: Secondary | ICD-10-CM

## 2018-12-13 ENCOUNTER — Other Ambulatory Visit: Payer: Self-pay | Admitting: *Deleted

## 2018-12-13 DIAGNOSIS — R69 Illness, unspecified: Secondary | ICD-10-CM | POA: Diagnosis not present

## 2018-12-13 MED ORDER — BACLOFEN 10 MG PO TABS: 10.0000 mg | ORAL_TABLET | Freq: Three times a day (TID) | ORAL | 1 refills | Status: DC

## 2018-12-25 ENCOUNTER — Other Ambulatory Visit: Payer: Self-pay

## 2018-12-25 ENCOUNTER — Encounter: Payer: Self-pay | Admitting: Podiatry

## 2018-12-25 ENCOUNTER — Ambulatory Visit (INDEPENDENT_AMBULATORY_CARE_PROVIDER_SITE_OTHER): Payer: Medicare HMO

## 2018-12-25 ENCOUNTER — Ambulatory Visit: Payer: Medicare HMO | Admitting: Podiatry

## 2018-12-25 DIAGNOSIS — Z89421 Acquired absence of other right toe(s): Secondary | ICD-10-CM | POA: Diagnosis not present

## 2018-12-25 DIAGNOSIS — M2041 Other hammer toe(s) (acquired), right foot: Secondary | ICD-10-CM | POA: Diagnosis not present

## 2018-12-25 DIAGNOSIS — M79675 Pain in left toe(s): Secondary | ICD-10-CM

## 2018-12-25 DIAGNOSIS — M21619 Bunion of unspecified foot: Secondary | ICD-10-CM

## 2018-12-25 DIAGNOSIS — L84 Corns and callosities: Secondary | ICD-10-CM | POA: Diagnosis not present

## 2018-12-25 DIAGNOSIS — M779 Enthesopathy, unspecified: Secondary | ICD-10-CM

## 2018-12-25 DIAGNOSIS — M79674 Pain in right toe(s): Secondary | ICD-10-CM

## 2018-12-25 DIAGNOSIS — B351 Tinea unguium: Secondary | ICD-10-CM

## 2018-12-25 DIAGNOSIS — M2042 Other hammer toe(s) (acquired), left foot: Secondary | ICD-10-CM

## 2018-12-25 DIAGNOSIS — E1149 Type 2 diabetes mellitus with other diabetic neurological complication: Secondary | ICD-10-CM

## 2018-12-26 NOTE — Progress Notes (Signed)
Subjective: 73 year old female presents the office today for concerns of bilateral foot pain.  She states that she started to get pain to the outside aspect of her left foot pointing the fifth metatarsal base.  Also she has noticed her toes have been curling more.  She denies any recent injury or trauma she denies any swelling or any redness.  Denies any open sores. Denies any systemic complaints such as fevers, chills, nausea, vomiting. No acute changes since last appointment, and no other complaints at this time.   Objective: AAO x3, NAD DP/PT pulses palpable bilaterally, CRT less than 3 seconds There is mild discomfort on the course of the left fifth metatarsal base along the peroneal tendon insertion but there is no area pinpoint tenderness.  There is no edema, erythema.  Perineal skin appears to be intact.  Previous partial amputation of right fourth toe.  Hammertoe contractures present.  Bunion deformity present. Nails are very hypertrophic, dystrophic and discolored with yellow-brown discoloration starting cause discomfort.  Also she has calluses to both big toes.  There is no ongoing ulceration or signs of infection. No pain with calf compression, swelling, warmth, erythema  Assessment: Left foot pain, tendinitis; symptomatic onychomycosis, hyperkeratotic lesions; type 2 diabetes with neuropathy  Plan: -All treatment options discussed with the patient including all alternatives, risks, complications.  -X-rays obtained reviewed.  There is no evidence of acute fracture or stress fracture. -She is not having significant pain and she wear supportive shoes.  The shoes that she has been wearing very flexible to accommodate her digital deformity.  Due to this I do think she will benefit from diabetic shoes.  She was seen today by Liliane Channel. -Debrided the nails x9 without any complications or bleeding -Hyperkeratotic lesion subdebrided x2 without any complications. -Patient encouraged to call the  office with any questions, concerns, change in symptoms.   Return in about 4 weeks (around 01/22/2019).  [name] DPM

## 2018-12-28 DIAGNOSIS — M75112 Incomplete rotator cuff tear or rupture of left shoulder, not specified as traumatic: Secondary | ICD-10-CM | POA: Diagnosis not present

## 2018-12-28 DIAGNOSIS — M7502 Adhesive capsulitis of left shoulder: Secondary | ICD-10-CM | POA: Diagnosis not present

## 2019-01-02 ENCOUNTER — Other Ambulatory Visit: Payer: Medicare HMO | Admitting: Orthotics

## 2019-01-09 ENCOUNTER — Telehealth: Payer: Self-pay | Admitting: Podiatry

## 2019-01-09 ENCOUNTER — Other Ambulatory Visit: Payer: Medicare HMO | Admitting: Orthotics

## 2019-01-09 MED ORDER — DICLOFENAC SODIUM 1 % EX GEL: 2.0000 g | Freq: Four times a day (QID) | CUTANEOUS | 11 refills | Status: DC

## 2019-01-09 NOTE — Addendum Note (Signed)
Addended by: Harriett Sine D on: 01/09/2019 02:29 PM   Modules accepted: Orders

## 2019-01-09 NOTE — Telephone Encounter (Signed)
Thanks

## 2019-01-09 NOTE — Telephone Encounter (Signed)
Pt was seen on 11/2 and was supposed to have a medicated cream/ointment prescribed to help with her foot pain but her pharmacy has not received anything from our office.   [facility] is [facility] [address]

## 2019-01-09 NOTE — Telephone Encounter (Signed)
Can do the compound cream for tendonitis or use topical voltaren gel.

## 2019-01-09 NOTE — Telephone Encounter (Signed)
I informed pt of Dr. Leigh Aurora recommendations and pt states she is familiar with Voltaren gel and will use that.

## 2019-01-11 ENCOUNTER — Other Ambulatory Visit: Payer: Medicare HMO | Admitting: Orthotics

## 2019-01-15 ENCOUNTER — Telehealth: Payer: Self-pay | Admitting: *Deleted

## 2019-01-15 NOTE — Telephone Encounter (Signed)
Called and spoke with the patient and the patient has already gotten the over the counter voltaren gel and it has helped a lot and I stated to call the office if any concerns or questions. Kristina Patel

## 2019-01-17 ENCOUNTER — Encounter: Payer: Self-pay | Admitting: Podiatry

## 2019-01-22 ENCOUNTER — Ambulatory Visit: Payer: Medicare HMO | Admitting: Podiatry

## 2019-01-22 ENCOUNTER — Other Ambulatory Visit: Payer: Medicare HMO

## 2019-01-25 DIAGNOSIS — M7502 Adhesive capsulitis of left shoulder: Secondary | ICD-10-CM | POA: Diagnosis not present

## 2019-01-26 ENCOUNTER — Other Ambulatory Visit: Payer: Medicare HMO

## 2019-01-29 ENCOUNTER — Other Ambulatory Visit: Payer: Medicare HMO | Admitting: Orthotics

## 2019-01-29 ENCOUNTER — Other Ambulatory Visit: Payer: Self-pay | Admitting: Family Medicine

## 2019-01-29 MED ORDER — ALPRAZOLAM 0.5 MG PO TABS: ORAL_TABLET | ORAL | 2 refills | Status: DC

## 2019-01-29 NOTE — Telephone Encounter (Signed)
Requesting:  Alprazolam Contract:    None UDS:   None Last Visit:   12/06/2018 Next Visit:    04/24/2019 Last Refill:    10/25/2018   #60 with 2 refills  Please Advise

## 2019-02-06 ENCOUNTER — Ambulatory Visit: Payer: Medicare HMO | Admitting: Podiatry

## 2019-02-09 DIAGNOSIS — R1012 Left upper quadrant pain: Secondary | ICD-10-CM | POA: Diagnosis not present

## 2019-02-09 DIAGNOSIS — R194 Change in bowel habit: Secondary | ICD-10-CM | POA: Diagnosis not present

## 2019-02-09 DIAGNOSIS — F32A Depression, unspecified: Secondary | ICD-10-CM | POA: Insufficient documentation

## 2019-02-09 DIAGNOSIS — F329 Major depressive disorder, single episode, unspecified: Secondary | ICD-10-CM | POA: Insufficient documentation

## 2019-02-09 DIAGNOSIS — R635 Abnormal weight gain: Secondary | ICD-10-CM | POA: Diagnosis not present

## 2019-02-09 DIAGNOSIS — Z85820 Personal history of malignant melanoma of skin: Secondary | ICD-10-CM | POA: Insufficient documentation

## 2019-02-09 DIAGNOSIS — K760 Fatty (change of) liver, not elsewhere classified: Secondary | ICD-10-CM | POA: Insufficient documentation

## 2019-02-10 ENCOUNTER — Other Ambulatory Visit: Payer: Self-pay | Admitting: Family Medicine

## 2019-02-19 ENCOUNTER — Other Ambulatory Visit: Payer: Medicare HMO | Admitting: Orthotics

## 2019-02-21 NOTE — Progress Notes (Signed)
Virtual Visit via Video Note  I connected with patient on 02/26/19 at  1:00 PM EST by audio enabled telemedicine application and verified that I am speaking with the correct person using two identifiers.   THIS ENCOUNTER IS A VIRTUAL VISIT DUE TO COVID-19 - PATIENT WAS NOT SEEN IN THE OFFICE. PATIENT HAS CONSENTED TO VIRTUAL VISIT / TELEMEDICINE VISIT   Location of patient: home  Location of provider: office  I discussed the limitations of evaluation and management by telemedicine and the availability of in person appointments. The patient expressed understanding and agreed to proceed.   Subjective:   Kristina Patel is a 73 y.o. female who presents for an Initial Medicare Annual Wellness Visit.  Review of Systems    Home Safety/Smoke Alarms: Feels safe in home. Smoke alarms in place.  Lives with husband. 1 story home. Walk-in shower.    Female:   Mammo- reports done in 06/2018 at [facility]- [facility]       [facility]-07/06/13.     Objective:    Today's Vitals   02/26/19 1304  BP: 110/72   There is no height or weight on file to calculate BMI.  Advanced Directives 02/26/2019 02/19/2018 01/13/2018 07/01/2015 07/21/2014 12/04/2013 11/14/2011  Does Patient Have a Medical Advance Directive? Yes No No Yes Yes No;Yes Patient does not have advance directive  Type of Scientist, forensic Power of [place];Living will - - Living will Living will Living will -  Does patient want to make changes to medical advance directive? No - Patient declined - - - - - -  Copy of [place] in Chart? No - copy requested - - - - - -  Would patient like information on creating a medical advance directive? - - No - Patient declined - - - -  Pre-existing out of facility DNR order (yellow form or pink MOST form) - - - - - - No  Some encounter information is confidential and restricted. Go to Review Flowsheets activity to see all data.    Current Medications  (verified) Outpatient Encounter Medications as of 02/26/2019  Medication Sig  . acetaminophen (TYLENOL) 325 MG tablet Take 325 mg by mouth every 6 (six) hours as needed for headache (pain).  . ACIDOPHILUS LACTOBACILLUS PO Take 2 tablets by mouth 3 (three) times daily with meals.   . Adalimumab (HUMIRA) 40 MG/0.4ML PSKT Inject into the skin.  Marland Kitchen ALPRAZolam (XANAX) 0.5 MG tablet TK 1 T PO Q 12 H PRN  . ASPIRIN 81 PO Take 81 mg by mouth daily.  Marland Kitchen aspirin-acetaminophen-caffeine (EXCEDRIN MIGRAINE) 250-250-65 MG tablet Take 1 tablet by mouth daily as needed for headache or migraine.  Marland Kitchen atorvastatin (LIPITOR) 10 MG tablet Take 10 mg by mouth daily.   . Azelastine-Fluticasone 137-50 MCG/ACT SUSP 1 spray per nostril twice daily as needed.  . baclofen (LIORESAL) 10 MG tablet TAKE 1 TABLET BY MOUTH 3 TIMES DAILY  . Biotin 5000 MCG TABS Take 5,000 mcg by mouth daily.  . budesonide-formoterol (SYMBICORT) 160-4.5 MCG/ACT inhaler Inhale 2 puffs into the lungs 2 (two) times daily.  . Cholecalciferol (D2000 ULTRA STRENGTH) 50 MCG (2000 UT) CAPS Take by mouth.  . cyanocobalamin ([employer] VITAMIN B12) 1000 MCG tablet Take 1,000 mcg by mouth daily.  . cyclobenzaprine (FLEXERIL) 10 MG tablet Take 10 mg by mouth at bedtime.   Marland Kitchen denosumab (PROLIA) 60 MG/ML SOSY injection Inject 60 mg into the skin  every 6 (six) months. Last injection July 2019  . diclofenac sodium (VOLTAREN) 1 % GEL Apply topically 4 (four) times daily.  . diclofenac Sodium (VOLTAREN) 1 % GEL Apply 2 g topically 4 (four) times daily.  . famotidine (PEPCID) 20 MG tablet Take 20 mg by mouth at bedtime.  . Flaxseed, Linseed, (FLAXSEED OIL) 1000 MG CAPS Take 1,000 mg by mouth daily with supper.   Marland Kitchen glipiZIDE (GLUCOTROL) 5 MG tablet Take 2.5 mg by mouth 2 (two) times daily.  Marland Kitchen levocetirizine (XYZAL) 5 MG tablet Take 5 mg by mouth every evening.  . lidocaine (LIDODERM) 5 % Place 1 patch onto the skin daily as needed (pain). Remove & Discard patch within 12  hours or as directed by MD  . lidocaine (XYLOCAINE) 2 % jelly 1 application as needed.  . lidocaine (XYLOCAINE) 5 % ointment Apply 1 application topically as needed.  . methocarbamol (ROBAXIN) 500 MG tablet Take 500 mg by mouth 2 (two) times daily as needed for muscle spasms.  . metoprolol (LOPRESSOR) 50 MG tablet Take 1 tablet (50 mg total) by mouth 2 (two) times daily.  . mometasone (ELOCON) 0.1 % cream Apply 1 application topically daily.  . mometasone (NASONEX) 50 MCG/ACT nasal spray Place 2 sprays into the nose daily.  . montelukast (SINGULAIR) 10 MG tablet Take 1 tablet (10 mg total) by mouth at bedtime.  . Multiple Vitamin (MULTIVITAMIN WITH MINERALS) TABS tablet Take 1 tablet by mouth daily with supper.  . pantoprazole (PROTONIX) 40 MG tablet Take 40 mg by mouth daily.  . pseudoephedrine (SUDAFED) 30 MG tablet Take 30 mg by mouth every 6 (six) hours as needed for congestion.  . Tiotropium Bromide Monohydrate (SPIRIVA RESPIMAT) 2.5 MCG/ACT AERS Inhale into the lungs.  . topiramate (TOPAMAX) 25 MG tablet Take 2 tablets (50 mg total) by mouth 2 (two) times daily.  . TURMERIC PO Take 900 mg by mouth 3 (three) times daily with meals.  . vitamin C (ASCORBIC ACID) 500 MG tablet Take 500 mg by mouth 2 (two) times daily.  Marland Kitchen losartan (COZAAR) 50 MG tablet Take 50 mg by mouth daily.  . pregabalin (LYRICA) 50 MG capsule Take one twice daily. If cough persists increase gradually to a dose of (Patient not taking: Reported on 02/26/2019)  . valsartan (DIOVAN) 160 MG tablet Take 160 mg by mouth daily.   No facility-administered encounter medications on file as of 02/26/2019.    Allergies (verified) Butalbital-asa-caff-codeine [fiorinal-codeine], Darvon, Fiorinal [butalbital-aspirin-caffeine], Penicillins, Amitriptyline, Ativan [lorazepam], Bactrim [sulfamethoxazole-trimethoprim], Ciprofloxacin hcl, Tape, Ambien [zolpidem tartrate], Benzodiazepines, and Propoxyphene   History: Past Medical History:    Diagnosis Date  . Bacterial overgrowth syndrome   . Breast mass   . Carpal tunnel syndrome   . Colon polyps   . Degenerative disc disease   . Depression   . Fall   . GERD (gastroesophageal reflux disease)   . Gouty arthropathy   . Headache   . Hyperlipidemia   . Hypertension   . IBS (irritable bowel syndrome)    constipation  . Internal prolapsed hemorrhoids   . Iron deficiency anemia   . Melanoma ([place])   . Osteoarthritis   . Osteoporosis   . Ovarian cancer ([place])   . Pelvic floor dysfunction 05/05/2016  . Peripheral neuropathy   . Prediabetes   . Pulmonary nodules   . Renal disease   . Rheumatoid arthritis(714.0)   . Squamous cell carcinoma skin of abdomen   . Uterine cancer ([place])   .  Vitamin D deficiency    Past Surgical History:  Procedure Laterality Date  . ABDOMINAL HYSTERECTOMY     partial  . ADENOIDECTOMY    . ANAL RECTAL MANOMETRY N/A 04/30/2016   Procedure: ANO RECTAL MANOMETRY;  Surgeon: Mauri Pole, MD;  Location: [facility];  Service: Endoscopy;  Laterality: N/A;  . APPENDECTOMY    . BREAST SURGERY Bilateral    benign fatty tumor removed  . CARPAL TUNNEL RELEASE Right   . CERVICAL SPINE SURGERY    . CHOLECYSTECTOMY    . COLONOSCOPY    . KNEE ARTHROPLASTY Right   . LUMBAR DISC SURGERY    . MASTECTOMY, PARTIAL    . MELANOMA EXCISION    . OPERATIVE HYSTEROSCOPY    . TONSILLECTOMY     Family History  Problem Relation Age of Onset  . Alzheimer's disease Mother   . Colon cancer Mother 67  . Lung cancer Mother   . Dementia Mother   . Melanoma Father   . Non-Hodgkin's lymphoma Father   . Heart disease Maternal Grandmother   . Colon cancer Maternal Grandfather   . Colon cancer Paternal Grandmother   . Colon cancer Paternal Grandfather    Social History   Socioeconomic History  . Marital status: Married    Spouse name: [name]  . Number of children: 2  . Years of education: 23  . Highest education level: Not on file  Occupational History   . Occupation: disabled  Tobacco Use  . Smoking status: Never Smoker  . Smokeless tobacco: Never Used  Substance and Sexual Activity  . Alcohol use: No  . Drug use: No  . Sexual activity: Not Currently  Other Topics Concern  . Not on file  Social History Narrative   Patient is married [name]) and lives with her husband.   Patient has two children.   Patient is retired.   Patient has a college education.   Patient is right handed.   Patient drinks 3-4 cups of coffee and tea daily.   [employer]   Social Determinants of Health   Financial Resource Strain:   . Difficulty of Paying Living Expenses: Not on file  Food Insecurity:   . Worried About Charity fundraiser in the Last Year: Not on file  . Ran Out of Food in the Last Year: Not on file  Transportation Needs:   . Lack of Transportation (Medical): Not on file  . Lack of Transportation (Non-Medical): Not on file  Physical Activity:   . Days of Exercise per Week: Not on file  . Minutes of Exercise per Session: Not on file  Stress:   . Feeling of Stress : Not on file  Social Connections:   . Frequency of Communication with Friends and Family: Not on file  . Frequency of Social Gatherings with Friends and Family: Not on file  . Attends Religious Services: Not on file  . Active Member of Clubs or Organizations: Not on file  . Attends Archivist Meetings: Not on file  . Marital Status: Not on file    Tobacco Counseling Counseling given: Not Answered   Clinical Intake:  Pain : No/denies pain    Activities of Daily Living In your present state of health, do you have any difficulty performing the following activities: 02/26/2019  Hearing? N  Vision? N  Difficulty concentrating or making decisions? N  Walking or climbing stairs? N  Dressing or bathing? N  Doing errands, shopping? N  Preparing  Food and eating ? N  Using the Toilet? N  In the past six months, have you accidently leaked urine? N  Do you  have problems with loss of bowel control? N  Managing your Medications? N  Managing your Finances? N  Housekeeping or managing your Housekeeping? N  Some recent data might be hidden     Immunizations and Health Maintenance Immunization History  Administered Date(s) Administered  . Influenza, High Dose Seasonal PF 01/23/2016  . Pneumococcal-Unspecified 02/22/2013  . Tetanus 02/23/2012   Health Maintenance Due  Topic Date Due  . PNA vac Low Risk Adult (2 of 2 - PCV13) 02/22/2014  . INFLUENZA VACCINE  09/23/2018    Patient Care Team: Shelda Pal, DO as PCP - General (Family Medicine)  Indicate any recent Medical Services you may have received from other than [facility] providers in the past year (date may be approximate).     Assessment:   This is a routine wellness examination for [place]. Physical assessment deferred to PCP.  Hearing/Vision screen Unable to assess. This visit is enabled though telemedicine due to Covid 19.   Dietary issues and exercise activities discussed: Current Exercise Habits: Home exercise routine, Type of exercise: walking, Time (Minutes): 30, Frequency (Times/Week): 2, Weekly Exercise (Minutes/Week): 60, Intensity: Mild, Exercise limited by: None identified Diet (meal preparation, eat out, water intake, caffeinated beverages, dairy products, fruits and vegetables): well balanced   Goals    . Patient Stated     Maintain healthy active life        Depression Screen PHQ 2/9 Scores 02/26/2019 01/23/2016 12/03/2014  PHQ - 2 Score 1 2 3   PHQ- 9 Score - 8 9    Fall Risk Fall Risk  02/26/2019 01/23/2016 12/03/2014  Falls in the past year? 1 Yes No  Number falls in past yr: 1 1 -  Injury with Fall? 0 No -  Follow up Education provided;Falls prevention discussed - -    Cognitive Function: Ad8 score reviewed for issues:  Issues making decisions:no  Less interest in hobbies / activities:no  Repeats questions, stories (family  complaining):no  Trouble using ordinary gadgets (microwave, computer, phone):no  Forgets the month or year: no  Mismanaging finances: no  Remembering appts: no  Daily problems with thinking and/or memory:no Ad8 score is=0         Screening Tests Health Maintenance  Topic Date Due  . PNA vac Low Risk Adult (2 of 2 - PCV13) 02/22/2014  . INFLUENZA VACCINE  09/23/2018  . MAMMOGRAM  10/25/2020  . TETANUS/TDAP  02/22/2022  . COLONOSCOPY  07/07/2023  . DEXA SCAN  Completed  . Hepatitis C Screening  Completed     Plan:   See you next year!  Continue to eat heart healthy diet (full of fruits, vegetables, whole grains, lean protein, water--limit salt, fat, and sugar intake) and increase physical activity as tolerated.  Continue doing brain stimulating activities (puzzles, reading, adult coloring books, staying active) to keep memory sharp.   Bring a copy of your living will and/or healthcare power of attorney to your next office visit.   I have personally reviewed and noted the following in the patient's chart:   . Medical and social history . Use of alcohol, tobacco or illicit drugs  . Current medications and supplements . Functional ability and status . Nutritional status . Physical activity . Advanced directives . List of other physicians . Hospitalizations, surgeries, and ER visits in previous 12 months . Vitals . Screenings  to include cognitive, depression, and falls . Referrals and appointments  In addition, I have reviewed and discussed with patient certain preventive protocols, quality metrics, and best practice recommendations. A written personalized care plan for preventive services as well as general preventive health recommendations were provided to patient.     Shela Nevin, [place]   02/26/2019

## 2019-02-26 ENCOUNTER — Ambulatory Visit (INDEPENDENT_AMBULATORY_CARE_PROVIDER_SITE_OTHER): Payer: Medicare HMO | Admitting: *Deleted

## 2019-02-26 ENCOUNTER — Other Ambulatory Visit: Payer: Self-pay

## 2019-02-26 ENCOUNTER — Encounter: Payer: Self-pay | Admitting: *Deleted

## 2019-02-26 ENCOUNTER — Other Ambulatory Visit: Payer: Self-pay | Admitting: Family Medicine

## 2019-02-26 VITALS — BP 110/72

## 2019-02-26 DIAGNOSIS — Z0000 Encounter for general adult medical examination without abnormal findings: Secondary | ICD-10-CM | POA: Diagnosis not present

## 2019-02-26 MED ORDER — BACLOFEN 10 MG PO TABS: 10.0000 mg | ORAL_TABLET | Freq: Three times a day (TID) | ORAL | 0 refills | Status: DC

## 2019-02-26 NOTE — Patient Instructions (Signed)
See you next year!  Continue to eat heart healthy diet (full of fruits, vegetables, whole grains, lean protein, water--limit salt, fat, and sugar intake) and increase physical activity as tolerated.  Continue doing brain stimulating activities (puzzles, reading, adult coloring books, staying active) to keep memory sharp.   Bring a copy of your living will and/or healthcare power of attorney to your next office visit.   Kristina Patel , Thank you for taking time to come for your Medicare Wellness Visit. I appreciate your ongoing commitment to your health goals. Please review the following plan we discussed and let me know if I can assist you in the future.   These are the goals we discussed: Goals    . Patient Stated     Maintain healthy active life         This is a list of the screening recommended for you and due dates:  Health Maintenance  Topic Date Due  . Pneumonia vaccines (2 of 2 - PCV13) 02/22/2014  . Flu Shot  09/23/2018  . Mammogram  10/25/2020  . Tetanus Vaccine  02/22/2022  . Colon Cancer Screening  07/07/2023  . DEXA scan (bone density measurement)  Completed  .  Hepatitis C: One time screening is recommended by Center for Disease Control  (CDC) for  adults born from [birth year] through 1964.   Completed    Preventive Care 73 Years and Older, Female Preventive care refers to lifestyle choices and visits with your health care provider that can promote health and wellness. This includes:  A yearly physical exam. This is also called an annual well check.  Regular dental and eye exams.  Immunizations.  Screening for certain conditions.  Healthy lifestyle choices, such as diet and exercise. What can I expect for my preventive care visit? Physical exam Your health care provider will check:  Height and weight. These may be used to calculate body mass index (BMI), which is a measurement that tells if you are at a healthy weight.  Heart rate and blood pressure.  Your  skin for abnormal spots. Counseling Your health care provider may ask you questions about:  Alcohol, tobacco, and drug use.  Emotional well-being.  Home and relationship well-being.  Sexual activity.  Eating habits.  History of falls.  Memory and ability to understand (cognition).  Work and work Statistician.  Pregnancy and menstrual history. What immunizations do I need?  Influenza (flu) vaccine  This is recommended every year. Tetanus, diphtheria, and pertussis (Tdap) vaccine  You may need a Td booster every 10 years. Varicella (chickenpox) vaccine  You may need this vaccine if you have not already been vaccinated. Zoster (shingles) vaccine  You may need this after age 73. Pneumococcal conjugate (PCV13) vaccine  One dose is recommended after age 73. Pneumococcal polysaccharide (PPSV23) vaccine  One dose is recommended after age 55. Measles, mumps, and rubella (MMR) vaccine  You may need at least one dose of MMR if you were born in 1957 or later. You may also need a second dose. Meningococcal conjugate (MenACWY) vaccine  You may need this if you have certain conditions. Hepatitis A vaccine  You may need this if you have certain conditions or if you travel or work in places where you may be exposed to hepatitis A. Hepatitis B vaccine  You may need this if you have certain conditions or if you travel or work in places where you may be exposed to hepatitis B. Haemophilus influenzae type b (Hib) vaccine  You may need this if you have certain conditions. You may receive vaccines as individual doses or as more than one vaccine together in one shot (combination vaccines). Talk with your health care provider about the risks and benefits of combination vaccines. What tests do I need? Blood tests  Lipid and cholesterol levels. These may be checked every 5 years, or more frequently depending on your overall health.  Hepatitis C test.  Hepatitis B test. Screening   Lung cancer screening. You may have this screening every year starting at age 29 if you have a 30-pack-year history of smoking and currently smoke or have quit within the past 15 years.  Colorectal cancer screening. All adults should have this screening starting at age 73 and continuing until age 9. Your health care provider may recommend screening at age 73 if you are at increased risk. You will have tests every 1-10 years, depending on your results and the type of screening test.  Diabetes screening. This is done by checking your blood sugar (glucose) after you have not eaten for a while (fasting). You may have this done every 1-3 years.  Mammogram. This may be done every 1-2 years. Talk with your health care provider about how often you should have regular mammograms.  BRCA-related cancer screening. This may be done if you have a family history of breast, ovarian, tubal, or peritoneal cancers. Other tests  Sexually transmitted disease (STD) testing.  Bone density scan. This is done to screen for osteoporosis. You may have this done starting at age 73. Follow these instructions at home: Eating and drinking  Eat a diet that includes fresh fruits and vegetables, whole grains, lean protein, and low-fat dairy products. Limit your intake of foods with high amounts of sugar, saturated fats, and salt.  Take vitamin and mineral supplements as recommended by your health care provider.  Do not drink alcohol if your health care provider tells you not to drink.  If you drink alcohol: ? Limit how much you have to 0-1 drink a day. ? Be aware of how much alcohol is in your drink. In the [place] one drink equals one 12 oz bottle of beer (355 mL), one 5 oz glass of wine (148 mL), or one 1 oz glass of hard liquor (44 mL). Lifestyle  Take daily care of your teeth and gums.  Stay active. Exercise for at least 30 minutes on 5 or more days each week.  Do not use any products that contain nicotine or  tobacco, such as cigarettes, e-cigarettes, and chewing tobacco. If you need help quitting, ask your health care provider.  If you are sexually active, practice safe sex. Use a condom or other form of protection in order to prevent STIs (sexually transmitted infections).  Talk with your health care provider about taking a low-dose aspirin or statin. What's next?  Go to your health care provider once a year for a well check visit.  Ask your health care provider how often you should have your eyes and teeth checked.  Stay up to date on all vaccines. This information is not intended to replace advice given to you by your health care provider. Make sure you discuss any questions you have with your health care provider. Document Revised: 02/02/2018 Document Reviewed: 02/02/2018 Elsevier Patient Education  2020 [employer].

## 2019-02-27 DIAGNOSIS — M75112 Incomplete rotator cuff tear or rupture of left shoulder, not specified as traumatic: Secondary | ICD-10-CM | POA: Diagnosis not present

## 2019-02-27 DIAGNOSIS — M7502 Adhesive capsulitis of left shoulder: Secondary | ICD-10-CM | POA: Diagnosis not present

## 2019-03-06 DIAGNOSIS — M7502 Adhesive capsulitis of left shoulder: Secondary | ICD-10-CM | POA: Diagnosis not present

## 2019-03-06 DIAGNOSIS — M75112 Incomplete rotator cuff tear or rupture of left shoulder, not specified as traumatic: Secondary | ICD-10-CM | POA: Diagnosis not present

## 2019-03-09 ENCOUNTER — Encounter: Payer: Self-pay | Admitting: Family Medicine

## 2019-03-09 DIAGNOSIS — H2513 Age-related nuclear cataract, bilateral: Secondary | ICD-10-CM | POA: Diagnosis not present

## 2019-03-09 DIAGNOSIS — H2511 Age-related nuclear cataract, right eye: Secondary | ICD-10-CM | POA: Diagnosis not present

## 2019-03-09 DIAGNOSIS — H25043 Posterior subcapsular polar age-related cataract, bilateral: Secondary | ICD-10-CM | POA: Diagnosis not present

## 2019-03-09 DIAGNOSIS — H25013 Cortical age-related cataract, bilateral: Secondary | ICD-10-CM | POA: Diagnosis not present

## 2019-03-12 ENCOUNTER — Other Ambulatory Visit: Payer: Self-pay | Admitting: Family Medicine

## 2019-03-12 DIAGNOSIS — R05 Cough: Secondary | ICD-10-CM

## 2019-03-12 DIAGNOSIS — R053 Chronic cough: Secondary | ICD-10-CM

## 2019-03-12 NOTE — Progress Notes (Signed)
am

## 2019-03-13 DIAGNOSIS — R1032 Left lower quadrant pain: Secondary | ICD-10-CM | POA: Diagnosis not present

## 2019-03-23 DIAGNOSIS — J300 Vasomotor rhinitis: Secondary | ICD-10-CM | POA: Diagnosis not present

## 2019-03-23 DIAGNOSIS — K219 Gastro-esophageal reflux disease without esophagitis: Secondary | ICD-10-CM | POA: Insufficient documentation

## 2019-03-23 DIAGNOSIS — J45991 Cough variant asthma: Secondary | ICD-10-CM | POA: Diagnosis not present

## 2019-03-26 DIAGNOSIS — M7502 Adhesive capsulitis of left shoulder: Secondary | ICD-10-CM | POA: Diagnosis not present

## 2019-03-26 DIAGNOSIS — M7062 Trochanteric bursitis, left hip: Secondary | ICD-10-CM | POA: Diagnosis not present

## 2019-03-26 DIAGNOSIS — M75112 Incomplete rotator cuff tear or rupture of left shoulder, not specified as traumatic: Secondary | ICD-10-CM | POA: Diagnosis not present

## 2019-03-27 DIAGNOSIS — M7062 Trochanteric bursitis, left hip: Secondary | ICD-10-CM | POA: Insufficient documentation

## 2019-04-03 DIAGNOSIS — M75112 Incomplete rotator cuff tear or rupture of left shoulder, not specified as traumatic: Secondary | ICD-10-CM | POA: Diagnosis not present

## 2019-04-03 DIAGNOSIS — M19012 Primary osteoarthritis, left shoulder: Secondary | ICD-10-CM | POA: Diagnosis not present

## 2019-04-03 DIAGNOSIS — M7552 Bursitis of left shoulder: Secondary | ICD-10-CM | POA: Diagnosis not present

## 2019-04-03 DIAGNOSIS — M7502 Adhesive capsulitis of left shoulder: Secondary | ICD-10-CM | POA: Diagnosis not present

## 2019-04-03 DIAGNOSIS — M25512 Pain in left shoulder: Secondary | ICD-10-CM | POA: Diagnosis not present

## 2019-04-03 DIAGNOSIS — M7522 Bicipital tendinitis, left shoulder: Secondary | ICD-10-CM | POA: Diagnosis not present

## 2019-04-04 ENCOUNTER — Other Ambulatory Visit: Payer: Self-pay | Admitting: Family Medicine

## 2019-04-23 ENCOUNTER — Other Ambulatory Visit: Payer: Self-pay

## 2019-04-24 ENCOUNTER — Ambulatory Visit: Payer: Medicare HMO | Admitting: Family Medicine

## 2019-04-24 DIAGNOSIS — R1012 Left upper quadrant pain: Secondary | ICD-10-CM | POA: Diagnosis not present

## 2019-04-24 DIAGNOSIS — R140 Abdominal distension (gaseous): Secondary | ICD-10-CM | POA: Diagnosis not present

## 2019-04-24 DIAGNOSIS — R1032 Left lower quadrant pain: Secondary | ICD-10-CM | POA: Diagnosis not present

## 2019-04-25 DIAGNOSIS — R1012 Left upper quadrant pain: Secondary | ICD-10-CM | POA: Diagnosis not present

## 2019-04-25 DIAGNOSIS — R140 Abdominal distension (gaseous): Secondary | ICD-10-CM | POA: Diagnosis not present

## 2019-04-30 DIAGNOSIS — R1012 Left upper quadrant pain: Secondary | ICD-10-CM | POA: Diagnosis not present

## 2019-04-30 DIAGNOSIS — R140 Abdominal distension (gaseous): Secondary | ICD-10-CM | POA: Diagnosis not present

## 2019-05-01 DIAGNOSIS — R69 Illness, unspecified: Secondary | ICD-10-CM | POA: Diagnosis not present

## 2019-05-07 ENCOUNTER — Other Ambulatory Visit: Payer: Self-pay

## 2019-05-08 ENCOUNTER — Encounter: Payer: Self-pay | Admitting: Family Medicine

## 2019-05-08 ENCOUNTER — Ambulatory Visit (INDEPENDENT_AMBULATORY_CARE_PROVIDER_SITE_OTHER): Payer: Medicare HMO | Admitting: Family Medicine

## 2019-05-08 ENCOUNTER — Other Ambulatory Visit: Payer: Self-pay

## 2019-05-08 VITALS — BP 132/82 | HR 72 | Temp 96.6°F | Ht 64.0 in | Wt 191.2 lb

## 2019-05-08 DIAGNOSIS — G629 Polyneuropathy, unspecified: Secondary | ICD-10-CM

## 2019-05-08 DIAGNOSIS — F320 Major depressive disorder, single episode, mild: Secondary | ICD-10-CM | POA: Diagnosis not present

## 2019-05-08 DIAGNOSIS — M7502 Adhesive capsulitis of left shoulder: Secondary | ICD-10-CM | POA: Diagnosis not present

## 2019-05-08 DIAGNOSIS — M069 Rheumatoid arthritis, unspecified: Secondary | ICD-10-CM

## 2019-05-08 DIAGNOSIS — R69 Illness, unspecified: Secondary | ICD-10-CM | POA: Diagnosis not present

## 2019-05-08 DIAGNOSIS — F411 Generalized anxiety disorder: Secondary | ICD-10-CM

## 2019-05-08 DIAGNOSIS — M19012 Primary osteoarthritis, left shoulder: Secondary | ICD-10-CM | POA: Diagnosis not present

## 2019-05-08 DIAGNOSIS — L659 Nonscarring hair loss, unspecified: Secondary | ICD-10-CM

## 2019-05-08 DIAGNOSIS — Z79899 Other long term (current) drug therapy: Secondary | ICD-10-CM

## 2019-05-08 DIAGNOSIS — M7522 Bicipital tendinitis, left shoulder: Secondary | ICD-10-CM | POA: Diagnosis not present

## 2019-05-08 DIAGNOSIS — M75112 Incomplete rotator cuff tear or rupture of left shoulder, not specified as traumatic: Secondary | ICD-10-CM | POA: Diagnosis not present

## 2019-05-08 LAB — IBC + FERRITIN
Ferritin: 53.9 ng/mL (ref 10.0–291.0)
Iron: 69 ug/dL (ref 42–145)
Saturation Ratios: 20.5 % (ref 20.0–50.0)
Transferrin: 240 mg/dL (ref 212.0–360.0)

## 2019-05-08 LAB — CBC
HCT: 39.4 % (ref 36.0–46.0)
Hemoglobin: 13.2 g/dL (ref 12.0–15.0)
MCHC: 33.4 g/dL (ref 30.0–36.0)
MCV: 87.4 fl (ref 78.0–100.0)
Platelets: 229 10*3/uL (ref 150.0–400.0)
RBC: 4.51 Mil/uL (ref 3.87–5.11)
RDW: 14.2 % (ref 11.5–15.5)
WBC: 5.3 10*3/uL (ref 4.0–10.5)

## 2019-05-08 LAB — TSH: TSH: 1.02 u[IU]/mL (ref 0.35–4.50)

## 2019-05-08 LAB — T4, FREE: Free T4: 1.03 ng/dL (ref 0.60–1.60)

## 2019-05-08 MED ORDER — METOPROLOL TARTRATE 100 MG PO TABS: 100.0000 mg | ORAL_TABLET | Freq: Two times a day (BID) | ORAL | 3 refills | Status: DC

## 2019-05-08 MED ORDER — VENLAFAXINE HCL ER 75 MG PO CP24: 75.0000 mg | ORAL_CAPSULE | Freq: Every day | ORAL | 2 refills | Status: DC

## 2019-05-08 NOTE — Progress Notes (Signed)
Chief Complaint  Patient presents with  . Follow-up    6 month    Subjective: Patient is a 74 y.o. female here for f/u.  Pt w hx of RA and hand cramping. Takes Baclofen for this that helps. She does follow with Rheum. No AE's.   Pt has a hx of anx/depression. Taking Sertraline thru [place].   Hx of neuropathy. Failed gabapentin.  She is interested if there is anything else today for this.  The patient has a history of hair loss.  She is taking biotin without relief.  No unexplained weight changes.  No bleeding anywhere.   Past Medical History:  Diagnosis Date  . Bacterial overgrowth syndrome   . Breast mass   . Carpal tunnel syndrome   . Colon polyps   . Degenerative disc disease   . Depression   . Fall   . GERD (gastroesophageal reflux disease)   . Gouty arthropathy   . Headache   . Hyperlipidemia   . Hypertension   . IBS (irritable bowel syndrome)    constipation  . Internal prolapsed hemorrhoids   . Iron deficiency anemia   . Melanoma ([place])   . Osteoarthritis   . Osteoporosis   . Ovarian cancer ([place])   . Pelvic floor dysfunction 05/05/2016  . Peripheral neuropathy   . Prediabetes   . Pulmonary nodules   . Renal disease   . Rheumatoid arthritis(714.0)   . Squamous cell carcinoma skin of abdomen   . Uterine cancer ([place])   . Vitamin D deficiency     Objective: BP 132/82 (BP Location: Right Arm, Patient Position: Sitting, Cuff Size: Normal)   Pulse 72   Temp (!) 96.6 F (35.9 C) (Temporal)   Ht 5\' 4"  (1.626 m)   Wt 191 lb 4 oz (86.8 kg)   SpO2 97%   BMI 32.83 kg/m  General: Awake, appears stated age HEENT: MMM, EOMi MSK: No deformities of the hand; there is mild tenderness to palpation over the joints and musculature of both hands Heart: RRR Lungs: CTAB, no rales, wheezes or rhonchi. No accessory muscle use Psych: Age appropriate judgment and insight, normal affect and mood  Assessment and Plan: Hair loss - Plan: TSH, T4, free, CBC, IBC + Ferritin  Mild  single current episode of major depressive disorder (HCC)  GAD (generalized anxiety disorder) - Plan: venlafaxine XR (EFFEXOR-XR) 75 MG 24 hr capsule  Neuropathy - Plan: venlafaxine XR (EFFEXOR-XR) 75 MG 24 hr capsule  Rheumatoid arthritis involving both hands, unspecified whether rheumatoid factor present ([place])  1- Ck labs. 2/3- Stop sertraline, replace with Effexor. 4-try Effexor for neuropathy.  Could increase dose if some improvement. 5-continue baclofen. Follow-up in 1 month recheck Effexor. The patient voiced understanding and agreement to the plan.  [place], DO 05/08/19  4:25 PM

## 2019-05-08 NOTE — Patient Instructions (Addendum)
Give [place] 2-3 business days to get the results of your labs back.   Keep the diet clean and stay active.  Stop the sertraline (Zoloft).   Let us know if you need anything.

## 2019-05-10 ENCOUNTER — Other Ambulatory Visit: Payer: Self-pay | Admitting: Family Medicine

## 2019-05-10 DIAGNOSIS — M7522 Bicipital tendinitis, left shoulder: Secondary | ICD-10-CM | POA: Insufficient documentation

## 2019-05-21 DIAGNOSIS — R05 Cough: Secondary | ICD-10-CM | POA: Diagnosis not present

## 2019-05-21 DIAGNOSIS — J300 Vasomotor rhinitis: Secondary | ICD-10-CM | POA: Diagnosis not present

## 2019-05-21 DIAGNOSIS — K219 Gastro-esophageal reflux disease without esophagitis: Secondary | ICD-10-CM | POA: Diagnosis not present

## 2019-05-24 ENCOUNTER — Emergency Department (HOSPITAL_BASED_OUTPATIENT_CLINIC_OR_DEPARTMENT_OTHER)
Admission: EM | Admit: 2019-05-24 | Discharge: 2019-05-24 | Disposition: A | Discharge status: Home / Self Care | Payer: Medicare HMO | Attending: Emergency Medicine | Admitting: Emergency Medicine

## 2019-05-24 ENCOUNTER — Encounter (HOSPITAL_BASED_OUTPATIENT_CLINIC_OR_DEPARTMENT_OTHER): Payer: Self-pay | Admitting: Emergency Medicine

## 2019-05-24 ENCOUNTER — Other Ambulatory Visit: Payer: Self-pay

## 2019-05-24 DIAGNOSIS — Z7982 Long term (current) use of aspirin: Secondary | ICD-10-CM | POA: Diagnosis not present

## 2019-05-24 DIAGNOSIS — I10 Essential (primary) hypertension: Secondary | ICD-10-CM | POA: Insufficient documentation

## 2019-05-24 DIAGNOSIS — X58XXXA Exposure to other specified factors, initial encounter: Secondary | ICD-10-CM | POA: Insufficient documentation

## 2019-05-24 DIAGNOSIS — S0502XA Injury of conjunctiva and corneal abrasion without foreign body, left eye, initial encounter: Secondary | ICD-10-CM

## 2019-05-24 DIAGNOSIS — Y9389 Activity, other specified: Secondary | ICD-10-CM | POA: Insufficient documentation

## 2019-05-24 DIAGNOSIS — Y999 Unspecified external cause status: Secondary | ICD-10-CM | POA: Insufficient documentation

## 2019-05-24 DIAGNOSIS — Y9289 Other specified places as the place of occurrence of the external cause: Secondary | ICD-10-CM | POA: Diagnosis not present

## 2019-05-24 DIAGNOSIS — Z79899 Other long term (current) drug therapy: Secondary | ICD-10-CM | POA: Insufficient documentation

## 2019-05-24 DIAGNOSIS — H5712 Ocular pain, left eye: Secondary | ICD-10-CM | POA: Diagnosis present

## 2019-05-24 MED ORDER — ERYTHROMYCIN 5 MG/GM OP OINT: TOPICAL_OINTMENT | OPHTHALMIC | 0 refills | Status: DC

## 2019-05-24 MED ORDER — FLUORESCEIN SODIUM 1 MG OP STRP
1.0000 | ORAL_STRIP | Freq: Once | OPHTHALMIC | Status: AC
  Administered 2019-05-24: 1 via OPHTHALMIC
  Filled 2019-05-24: qty 1

## 2019-05-24 MED ORDER — TETRACAINE HCL 0.5 % OP SOLN
2.0000 [drp] | Freq: Once | OPHTHALMIC | Status: AC
  Administered 2019-05-24: 2 [drp] via OPHTHALMIC
  Filled 2019-05-24: qty 4

## 2019-05-24 MED ORDER — ERYTHROMYCIN 5 MG/GM OP OINT: TOPICAL_OINTMENT | Freq: Four times a day (QID) | OPHTHALMIC | 0 refills | Status: AC

## 2019-05-24 NOTE — ED Provider Notes (Signed)
[facility] EMERGENCY DEPARTMENT Provider Note   CSN: [id number] Arrival date & time: 05/24/19  1705     History Chief Complaint  Patient presents with  . Eye Problem    Kristina Patel is a 74 y.o. female.  Presents to ER with complaints of eye pain, blurred vision.  States that 4 days ago she noted a foreign body sensation in her left eye, this irritation then started to become more painful and then noted some redness in her eye.  Also has had some mild blurriness in her vision today.  No curtain coming over eyes, no tunnel vision, no severe photosensitivity.  No numbness, weakness, speech changes.  No prior history stroke.  Wears Rx glasses, does not use contacts.  HPI     Past Medical History:  Diagnosis Date  . Bacterial overgrowth syndrome   . Breast mass   . Carpal tunnel syndrome   . Colon polyps   . Degenerative disc disease   . Depression   . Fall   . GERD (gastroesophageal reflux disease)   . Gouty arthropathy   . Headache   . Hyperlipidemia   . Hypertension   . IBS (irritable bowel syndrome)    constipation  . Internal prolapsed hemorrhoids   . Iron deficiency anemia   . Melanoma ([place])   . Osteoarthritis   . Osteoporosis   . Ovarian cancer ([place])   . Pelvic floor dysfunction 05/05/2016  . Peripheral neuropathy   . Prediabetes   . Pulmonary nodules   . Renal disease   . Rheumatoid arthritis(714.0)   . Squamous cell carcinoma skin of abdomen   . Uterine cancer ([place])   . Vitamin D deficiency     Patient Active Problem List   Diagnosis Date Noted  . GAD (generalized anxiety disorder) 05/08/2019  . Neuropathy 05/08/2019  . Cough, persistent 11/16/2018  . Nonallergic rhinitis 11/16/2018  . Intolerance, food 11/16/2018  . Chronic cough 11/08/2018  . Chronic myofascial pain 08/14/2018  . Chronic pain disorder 08/14/2018  . Lumbar degenerative disc disease 08/14/2018  . Lumbar facet joint pain 08/14/2018  . Lumbar foraminal stenosis  08/14/2018  . Polyarthralgia 08/14/2018  . Lumbar radiculopathy 07/19/2018  . Adhesive capsulitis of left shoulder 08/14/2017  . Degenerative joint disease of left acromioclavicular joint 06/21/2017  . Incomplete tear of left rotator cuff 06/21/2017  . Lumbar spondylolysis 03/16/2017  . Cervical spondylosis with myelopathy 03/16/2017  . Chronic constipation 06/28/2016  . Anemia 06/28/2016  . Pelvic floor dysfunction 05/05/2016  . Incontinence of feces   . Mild single current episode of major depressive disorder ([place]) 04/14/2016  . Mild cognitive impairment 04/14/2016  . Hx of colonic polyps 04/02/2016  . Family history of colon cancer in mother 04/02/2016  . Cervicogenic headache 04/07/2015  . Tension headache 09/12/2014  . Drug-induced polyneuropathy ([place]) 09/12/2014  . Paresthesias 03/26/2013  . Peripheral vascular disease, unspecified ([place]) 04/24/2012  . Encephalopathy acute 11/14/2011  . Community acquired pneumonia 11/14/2011  . Rheumatoid arthritis(714.0) 11/14/2011  . HTN (hypertension) 11/14/2011  . Hyperlipidemia 11/14/2011  . Gout 11/14/2011  . Anxiety 11/14/2011  . Rheumatoid arthritis involving both hands ([place]) 04/12/2011    Past Surgical History:  Procedure Laterality Date  . ABDOMINAL HYSTERECTOMY     partial  . ADENOIDECTOMY    . ANAL RECTAL MANOMETRY N/A 04/30/2016   Procedure: ANO RECTAL MANOMETRY;  Surgeon: Mauri Pole, MD;  Location: [facility];  Service: Endoscopy;  Laterality: N/A;  . APPENDECTOMY    .  BREAST SURGERY Bilateral    benign fatty tumor removed  . CARPAL TUNNEL RELEASE Right   . CERVICAL SPINE SURGERY    . CHOLECYSTECTOMY    . COLONOSCOPY    . KNEE ARTHROPLASTY Right   . LUMBAR DISC SURGERY    . MASTECTOMY, PARTIAL    . MELANOMA EXCISION    . OPERATIVE HYSTEROSCOPY    . TONSILLECTOMY       OB History   No obstetric history on file.     Family History  Problem Relation Age of Onset  . Alzheimer's disease Mother   .  Colon cancer Mother 62  . Lung cancer Mother   . Dementia Mother   . Melanoma Father   . Non-Hodgkin's lymphoma Father   . Heart disease Maternal Grandmother   . Colon cancer Maternal Grandfather   . Colon cancer Paternal Grandmother   . Colon cancer Paternal Grandfather     Social History   Tobacco Use  . Smoking status: Never Smoker  . Smokeless tobacco: Never Used  Substance Use Topics  . Alcohol use: No  . Drug use: No    Home Medications Prior to Admission medications   Medication Sig Start Date End Date Taking? Authorizing Provider  acetaminophen (TYLENOL) 325 MG tablet Take 325 mg by mouth every 6 (six) hours as needed for headache (pain).    [provider]  ACIDOPHILUS LACTOBACILLUS PO Take 2 tablets by mouth 3 (three) times daily with meals.     [provider]  Adalimumab (HUMIRA) 40 MG/0.4ML PSKT Inject into the skin.    [provider]  ALPRAZolam Duanne Moron) 0.5 MG tablet TK 1 T PO Q 12 H PRN 01/29/19   Shelda Pal, DO  ASPIRIN 81 PO Take 81 mg by mouth daily.    [provider]  aspirin-acetaminophen-caffeine (EXCEDRIN MIGRAINE) 2528182870 MG tablet Take 1 tablet by mouth daily as needed for headache or migraine.    [provider]  atorvastatin (LIPITOR) 10 MG tablet Take 10 mg by mouth daily.     [provider]  Azelastine-Fluticasone 137-50 MCG/ACT SUSP 1 spray per nostril twice daily as needed. 11/16/18   Bobbitt, [place] Muta, MD  baclofen (LIORESAL) 10 MG tablet TAKE ONE TABLET BY MOUTH THREE TIMES A DAY 05/11/19   Wendling, Crosby Oyster, DO  Biotin 5000 MCG TABS Take 5,000 mcg by mouth daily.    [provider]  budesonide-formoterol (SYMBICORT) 160-4.5 MCG/ACT inhaler Inhale 2 puffs into the lungs 2 (two) times daily.    [provider]  Cholecalciferol (D2000 ULTRA STRENGTH) 50 MCG (2000 UT) CAPS Take by mouth.    [provider]  cyanocobalamin ([employer] VITAMIN B12) 1000  MCG tablet Take 1,000 mcg by mouth daily.    [provider]  cyclobenzaprine (FLEXERIL) 10 MG tablet Take 10 mg by mouth at bedtime.  01/13/16   [provider]  denosumab (PROLIA) 60 MG/ML SOSY injection Inject 60 mg into the skin every 6 (six) months. Last injection July 2019    [provider]  diclofenac Sodium (VOLTAREN) 1 % GEL Apply 2 g topically 4 (four) times daily. 01/09/19   [name], DPM  erythromycin ophthalmic ointment Place into the left eye 4 (four) times daily for 7 days. Place a 1/2 inch ribbon of ointment into the lower eyelid. 05/24/19 05/31/19  Lucrezia Starch, MD  famotidine (PEPCID) 20 MG tablet Take 20 mg by mouth at bedtime.    [provider]  Flaxseed, Linseed, (FLAXSEED OIL) 1000 MG CAPS Take 1,000 mg by mouth daily with supper.     [provider]  glipiZIDE (GLUCOTROL) 5 MG tablet Take 2.5 mg by mouth 2 (two) times daily.    [provider]  levocetirizine (XYZAL) 5 MG tablet Take 5 mg by mouth every evening.    [provider]  lidocaine (LIDODERM) 5 % Place 1 patch onto the skin daily as needed (pain). Remove & Discard patch within 12 hours or as directed by MD    [provider]  lidocaine (XYLOCAINE) 2 % jelly 1 application as needed.    [provider]  lidocaine (XYLOCAINE) 5 % ointment Apply 1 application topically as needed.    [provider]  methocarbamol (ROBAXIN) 500 MG tablet Take 500 mg by mouth 2 (two) times daily as needed for muscle spasms.    [provider]  metoprolol tartrate (LOPRESSOR) 100 MG tablet Take 1 tablet (100 mg total) by mouth 2 (two) times daily. 05/08/19   Wendling, Crosby Oyster, DO  mometasone (ELOCON) 0.1 % cream Apply 1 application topically daily.    [provider]  mometasone (NASONEX) 50 MCG/ACT nasal spray Place 2 sprays into the nose daily.    [provider]  montelukast (SINGULAIR) 10 MG tablet Take  1 tablet (10 mg total) by mouth at bedtime. 12/06/18   Shelda Pal, DO  Multiple Vitamin (MULTIVITAMIN WITH MINERALS) TABS tablet Take 1 tablet by mouth daily with supper.    [provider]  pantoprazole (PROTONIX) 40 MG tablet Take 40 mg by mouth daily.    [provider]  Tiotropium Bromide Monohydrate (SPIRIVA RESPIMAT) 2.5 MCG/ACT AERS Inhale into the lungs.    [provider]  topiramate (TOPAMAX) 25 MG tablet Take 2 tablets (50 mg total) by mouth 2 (two) times daily. 09/23/15   Garvin Fila, MD  TURMERIC PO Take 900 mg by mouth 3 (three) times daily with meals.    [provider]  venlafaxine XR (EFFEXOR-XR) 75 MG 24 hr capsule Take 1 capsule (75 mg total) by mouth daily with breakfast. 05/08/19   Wendling, Crosby Oyster, DO    Allergies    Butalbital-asa-caff-codeine [fiorinal-codeine], Darvon, Fiorinal [butalbital-aspirin-caffeine], Penicillins, Amitriptyline, Ativan [lorazepam], Bactrim [sulfamethoxazole-trimethoprim], Ciprofloxacin hcl, Tape, Ambien [zolpidem tartrate], Benzodiazepines, and Propoxyphene  Review of Systems   Review of Systems  Constitutional: Negative for chills and fever.  HENT: Negative for ear pain and sore throat.   Eyes: Positive for pain, redness and visual disturbance.  Respiratory: Negative for cough and shortness of breath.   Cardiovascular: Negative for chest pain and palpitations.  Gastrointestinal: Negative for abdominal pain and vomiting.  Genitourinary: Negative for dysuria and hematuria.  Musculoskeletal: Negative for arthralgias and back pain.  Skin: Negative for color change and rash.  Neurological: Negative for seizures and syncope.  All other systems reviewed and are negative.   Physical Exam Updated Vital Signs BP (!) 157/87 (BP Location: Left Arm)   Pulse 62   Temp 98.3 F (36.8 C) (Oral)   Resp 16   Ht 5\' 3"  (1.6 m)   Wt 81.6 kg   SpO2 98%   BMI 31.89 kg/m   Physical Exam Vitals  and nursing note reviewed.  Constitutional:      General: She is not in acute distress.    Appearance: She is well-developed.  HENT:     Head: Normocephalic and atraumatic.  Eyes:     General:  Lids are normal. Lids are everted, no foreign bodies appreciated. Vision grossly intact.        Right eye: No foreign body or discharge.        Left eye: No foreign body or discharge.     Intraocular pressure: Right eye pressure is 18 mmHg. Left eye pressure is 12 mmHg. Measurements were taken using a handheld tonometer.    Conjunctiva/sclera:     Right eye: Right conjunctiva is not injected. No chemosis, exudate or hemorrhage.    Left eye: Left conjunctiva is injected. No chemosis, exudate or hemorrhage.    Pupils: Pupils are equal, round, and reactive to light.      Comments: No foreign body identified on slit lamp exam, pupils briskly reactive  Cardiovascular:     Rate and Rhythm: Normal rate and regular rhythm.     Heart sounds: No murmur.  Pulmonary:     Effort: Pulmonary effort is normal. No respiratory distress.     Breath sounds: Normal breath sounds.  Abdominal:     Palpations: Abdomen is soft.     Tenderness: There is no abdominal tenderness.  Musculoskeletal:     Cervical back: Neck supple.  Skin:    General: Skin is warm and dry.     Capillary Refill: Capillary refill takes less than 2 seconds.  Neurological:     General: No focal deficit present.     Mental Status: She is alert and oriented to person, place, and time.     ED Results / Procedures / Treatments   Labs (all labs ordered are listed, but only abnormal results are displayed) Labs Reviewed - No data to display  EKG None  Radiology No results found.  Procedures Procedures (including critical care time)  Medications Ordered in ED Medications  tetracaine (PONTOCAINE) 0.5 % ophthalmic solution 2 drop (2 drops Left Eye Given by Other 05/24/19 1808)  fluorescein ophthalmic strip 1 strip (1 strip Left Eye Given  by Other 05/24/19 1808)    ED Course  I have reviewed the triage vital signs and the nursing notes.  Pertinent labs & imaging results that were available during my care of the patient were reviewed by me and considered in my medical decision making (see chart for details).    MDM Rules/Calculators/A&P                      74 y/o lady with left eye foreign body sensation, pain, redness. On exam well appearing, no additional symptoms. 20/25 on right eye, 20/40 on left. Slit lamp exam grossly normal. Pupils briskly reactive and equal. No foreign body. Flourscein stain with small linear corneal abrasion. IOP normal. Reviewed findings with Dr. Kathlen Mody on call for optho. Recommended erythromycin eye ointment, f/u in his clinic. Reviewed with patient and return precautions.  D/c'd home.    After the discussed management above, the patient was determined to be safe for discharge.  The patient was in agreement with this plan and all questions regarding their care were answered.  ED return precautions were discussed and the patient will return to the ED with any significant worsening of condition.   Final Clinical Impression(s) / ED Diagnoses Final diagnoses:  Abrasion of left cornea, initial encounter    Rx / DC Orders ED Discharge Orders         Ordered    erythromycin ophthalmic ointment  Status:  Discontinued     05/24/19 1949    erythromycin ophthalmic ointment  4 times daily     05/24/19 1950           Lucrezia Starch, MD 05/24/19 2119

## 2019-05-24 NOTE — ED Triage Notes (Signed)
L eye pain and blurry vision x 4 days.

## 2019-05-24 NOTE — Discharge Instructions (Signed)
Please use antibiotic ointment as prescribed to your left eye.  Return to ER if you develop worsening vision blurriness, pain or other new concerning symptom.  Please call the ophthalmology office tomorrow to get close follow-up appointment in their clinic.

## 2019-05-25 DIAGNOSIS — K5730 Diverticulosis of large intestine without perforation or abscess without bleeding: Secondary | ICD-10-CM | POA: Diagnosis not present

## 2019-05-25 DIAGNOSIS — R918 Other nonspecific abnormal finding of lung field: Secondary | ICD-10-CM | POA: Diagnosis not present

## 2019-05-25 DIAGNOSIS — K3189 Other diseases of stomach and duodenum: Secondary | ICD-10-CM | POA: Diagnosis not present

## 2019-05-25 DIAGNOSIS — R109 Unspecified abdominal pain: Secondary | ICD-10-CM | POA: Diagnosis not present

## 2019-05-25 DIAGNOSIS — M5126 Other intervertebral disc displacement, lumbar region: Secondary | ICD-10-CM | POA: Diagnosis not present

## 2019-05-27 ENCOUNTER — Other Ambulatory Visit: Payer: Self-pay | Admitting: Family Medicine

## 2019-05-28 DIAGNOSIS — H20042 Secondary noninfectious iridocyclitis, left eye: Secondary | ICD-10-CM | POA: Diagnosis not present

## 2019-05-28 DIAGNOSIS — H2513 Age-related nuclear cataract, bilateral: Secondary | ICD-10-CM | POA: Diagnosis not present

## 2019-05-28 DIAGNOSIS — H04123 Dry eye syndrome of bilateral lacrimal glands: Secondary | ICD-10-CM | POA: Diagnosis not present

## 2019-05-28 DIAGNOSIS — H25013 Cortical age-related cataract, bilateral: Secondary | ICD-10-CM | POA: Diagnosis not present

## 2019-05-28 NOTE — Telephone Encounter (Signed)
Requesting:  alprazolam Contract:   05/08/2019 UDS:   05/08/2019 Last Visit:  05/08/2019 Next Visit:    06/13/2019 Last Refill:     #60 with 2 refills on 01/29/2019  Please Advise

## 2019-06-07 DIAGNOSIS — Z1152 Encounter for screening for COVID-19: Secondary | ICD-10-CM | POA: Diagnosis not present

## 2019-06-12 ENCOUNTER — Other Ambulatory Visit: Payer: Self-pay

## 2019-06-13 ENCOUNTER — Ambulatory Visit: Payer: Medicare HMO | Admitting: Family Medicine

## 2019-06-14 DIAGNOSIS — R933 Abnormal findings on diagnostic imaging of other parts of digestive tract: Secondary | ICD-10-CM | POA: Diagnosis not present

## 2019-06-14 DIAGNOSIS — R1012 Left upper quadrant pain: Secondary | ICD-10-CM | POA: Diagnosis not present

## 2019-06-15 ENCOUNTER — Encounter: Payer: Self-pay | Admitting: Podiatry

## 2019-06-15 ENCOUNTER — Other Ambulatory Visit: Payer: Self-pay

## 2019-06-15 ENCOUNTER — Ambulatory Visit: Payer: Medicare HMO | Admitting: Podiatry

## 2019-06-15 VITALS — Temp 97.1°F

## 2019-06-15 DIAGNOSIS — M79674 Pain in right toe(s): Secondary | ICD-10-CM

## 2019-06-15 DIAGNOSIS — B351 Tinea unguium: Secondary | ICD-10-CM

## 2019-06-15 DIAGNOSIS — L84 Corns and callosities: Secondary | ICD-10-CM

## 2019-06-15 DIAGNOSIS — M79675 Pain in left toe(s): Secondary | ICD-10-CM

## 2019-06-15 DIAGNOSIS — M48061 Spinal stenosis, lumbar region without neurogenic claudication: Secondary | ICD-10-CM | POA: Diagnosis not present

## 2019-06-15 DIAGNOSIS — M2042 Other hammer toe(s) (acquired), left foot: Secondary | ICD-10-CM

## 2019-06-15 DIAGNOSIS — M2041 Other hammer toe(s) (acquired), right foot: Secondary | ICD-10-CM

## 2019-06-15 DIAGNOSIS — M545 Low back pain: Secondary | ICD-10-CM | POA: Diagnosis not present

## 2019-06-15 DIAGNOSIS — E1149 Type 2 diabetes mellitus with other diabetic neurological complication: Secondary | ICD-10-CM | POA: Diagnosis not present

## 2019-06-15 DIAGNOSIS — M2550 Pain in unspecified joint: Secondary | ICD-10-CM | POA: Diagnosis not present

## 2019-06-15 DIAGNOSIS — M5136 Other intervertebral disc degeneration, lumbar region: Secondary | ICD-10-CM | POA: Diagnosis not present

## 2019-06-15 DIAGNOSIS — Z89421 Acquired absence of other right toe(s): Secondary | ICD-10-CM

## 2019-06-15 DIAGNOSIS — M4726 Other spondylosis with radiculopathy, lumbar region: Secondary | ICD-10-CM | POA: Diagnosis not present

## 2019-06-15 NOTE — Progress Notes (Signed)
Subjective: 74 year old female presents the office today for concerns of thick, elongated, painful nails that she cannot trim himself.  She denies any redness or drainage from the toenail sites or calluses.  No fevers, chills, nausea, vomiting.  No calf pain, chest pain, shortness of breath.    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 Sensation decreased with Derrel Nip monofilament Nails are hypertrophic, dystrophic, discolored x9.   Hyperkeratotic lesions bilateral submetatarsal 1 and right submetatarsal 2.  First metatarsal heads plantarly with atrophy of the fat pad. No other open lesions or pre-ulcerative lesions.  No pain with calf compression, swelling, warmth, erythema  Assessment: 74 year old female seen for onychomycosis, hyperkeratotic lesions  Plan: -All treatment options discussed with the patient including all alternatives, risks, complications.  -Nails sharply debrided x9 without any complications or bleeding. -Hyperkeratotic lesion sharply today x3 without any complications or bleeding -Offloading pads dispensed -Monitor for any clinical signs or symptoms of infection and directed to call the office immediately should any occur or go to the ER.  RTC 3 months or sooner if needed  [name] DPM

## 2019-06-19 ENCOUNTER — Other Ambulatory Visit: Payer: Self-pay | Admitting: Family Medicine

## 2019-06-20 ENCOUNTER — Encounter: Payer: Self-pay | Admitting: Family Medicine

## 2019-06-20 ENCOUNTER — Ambulatory Visit (INDEPENDENT_AMBULATORY_CARE_PROVIDER_SITE_OTHER): Payer: Medicare HMO | Admitting: Family Medicine

## 2019-06-20 ENCOUNTER — Other Ambulatory Visit: Payer: Self-pay

## 2019-06-20 VITALS — BP 122/80 | HR 102 | Temp 96.0°F | Ht 64.0 in | Wt 178.5 lb

## 2019-06-20 DIAGNOSIS — R911 Solitary pulmonary nodule: Secondary | ICD-10-CM | POA: Diagnosis not present

## 2019-06-20 DIAGNOSIS — R69 Illness, unspecified: Secondary | ICD-10-CM | POA: Diagnosis not present

## 2019-06-20 DIAGNOSIS — F411 Generalized anxiety disorder: Secondary | ICD-10-CM | POA: Diagnosis not present

## 2019-06-20 DIAGNOSIS — F320 Major depressive disorder, single episode, mild: Secondary | ICD-10-CM

## 2019-06-20 MED ORDER — BUPROPION HCL ER (XL) 150 MG PO TB24: 150.0000 mg | ORAL_TABLET | Freq: Every day | ORAL | 2 refills | Status: DC

## 2019-06-20 NOTE — Progress Notes (Signed)
Chief Complaint  Patient presents with  . Follow-up    Subjective Kristina Patel presents for f/u anxiety/depression.  She is currently being treated with Effexor XR 75 mg/d after being changed from Zoloft.  Reports no improvement since treatment. No thoughts of harming self or others. No self-medication with alcohol, prescription drugs or illicit drugs. Pt is following with a counselor/psychologist.  Had CT abd/pel thru [facility], showed pulm nodules and rec'd 3 mo f/u .   Past Medical History:  Diagnosis Date  . Bacterial overgrowth syndrome   . Breast mass   . Carpal tunnel syndrome   . Colon polyps   . Degenerative disc disease   . Depression   . Fall   . GERD (gastroesophageal reflux disease)   . Gouty arthropathy   . Headache   . Hyperlipidemia   . Hypertension   . IBS (irritable bowel syndrome)    constipation  . Internal prolapsed hemorrhoids   . Iron deficiency anemia   . Melanoma ([place])   . Osteoarthritis   . Osteoporosis   . Ovarian cancer ([place])   . Pelvic floor dysfunction 05/05/2016  . Peripheral neuropathy   . Prediabetes   . Pulmonary nodules   . Renal disease   . Rheumatoid arthritis(714.0)   . Squamous cell carcinoma skin of abdomen   . Uterine cancer ([place])   . Vitamin D deficiency    Allergies as of 06/20/2019      Reactions   Butalbital-asa-caff-codeine [fiorinal-codeine] Anaphylaxis, Swelling   Darvon Other (See Comments)   Irritable & hyper   Fiorinal [butalbital-aspirin-caffeine] Anaphylaxis   Penciclovir Anaphylaxis   Penicillins Anaphylaxis   Has patient had a PCN reaction causing immediate rash, facial/tongue/throat swelling, SOB or lightheadedness with hypotension: Yes Has patient had a PCN reaction causing severe rash involving mucus membranes or skin necrosis: No Has patient had a PCN reaction that required hospitalization: No Has patient had a PCN reaction occurring within the last 10 years: No If all of the above answers are "NO", then  may proceed with Cephalosporin use.   Amitriptyline Other (See Comments)   hyperactivity   Ativan [lorazepam] Other (See Comments)   Adverse reaction...made patient "act out" - out of her mind   Bactrim [sulfamethoxazole-trimethoprim] Diarrhea, Nausea And Vomiting, Other (See Comments)   Severe cramps   Ciprofloxacin Hcl Nausea And Vomiting, Other (See Comments)   hallucinations   Tape Other (See Comments)   Allergic to plastic tape. The adhesive causes redness to skin as well as loss of skin. - please use paper tape   Ambien [zolpidem Tartrate] Other (See Comments)   Sleep walking   Benzodiazepines Cough   Propoxyphene Rash, Other (See Comments)   Irritable and hyperactive      Medication List       Accurate as of June 20, 2019  2:54 PM. If you have any questions, ask your nurse or doctor.        acetaminophen 325 MG tablet Commonly known as: TYLENOL Take 325 mg by mouth every 6 (six) hours as needed for headache (pain).   ACIDOPHILUS LACTOBACILLUS PO Take 2 tablets by mouth 3 (three) times daily with meals.   ALPRAZolam 0.5 MG tablet Commonly known as: XANAX TAKE 1 TABLET BY MOUTH EVERY 12 HOURS AS NEEDED   ASPIRIN 81 PO Take 81 mg by mouth daily.   aspirin-acetaminophen-caffeine 250-250-65 MG tablet Commonly known as: EXCEDRIN MIGRAINE Take 1 tablet by mouth daily as needed for headache or migraine.  atorvastatin 10 MG tablet Commonly known as: LIPITOR Take 10 mg by mouth daily.   Azelastine-Fluticasone 137-50 MCG/ACT Susp 1 spray per nostril twice daily as needed.   baclofen 10 MG tablet Commonly known as: LIORESAL TAKE ONE TABLET BY MOUTH THREE TIMES A DAY   Biotin 5000 MCG Tabs Take 5,000 mcg by mouth daily.   budesonide-formoterol 160-4.5 MCG/ACT inhaler Commonly known as: SYMBICORT Inhale 2 puffs into the lungs 2 (two) times daily.   buPROPion 150 MG 24 hr tablet Commonly known as: Wellbutrin XL Take 1 tablet (150 mg total) by mouth  daily. Started by: Shelda Pal, DO   CVS VITAMIN B12 1000 MCG tablet Generic drug: cyanocobalamin Take 1,000 mcg by mouth daily.   cyclobenzaprine 10 MG tablet Commonly known as: FLEXERIL Take 10 mg by mouth at bedtime.   D2000 Ultra Strength 50 MCG (2000 UT) Caps Generic drug: Cholecalciferol Take by mouth.   denosumab 60 MG/ML Sosy injection Commonly known as: PROLIA Inject 60 mg into the skin every 6 (six) months. Last injection July 2019   diclofenac Sodium 1 % Gel Commonly known as: Voltaren Apply 2 g topically 4 (four) times daily.   famotidine 20 MG tablet Commonly known as: PEPCID Take 20 mg by mouth at bedtime.   Flaxseed Oil 1000 MG Caps Take 1,000 mg by mouth daily with supper.   glipiZIDE 5 MG tablet Commonly known as: GLUCOTROL Take 2.5 mg by mouth 2 (two) times daily.   Humira 40 MG/0.4ML Pskt Generic drug: Adalimumab Inject into the skin.   levocetirizine 5 MG tablet Commonly known as: XYZAL Take 5 mg by mouth every evening.   lidocaine 2 % jelly Commonly known as: XYLOCAINE 1 application as needed.   lidocaine 5 % ointment Commonly known as: XYLOCAINE Apply 1 application topically as needed.   lidocaine 5 % Commonly known as: LIDODERM Place 1 patch onto the skin daily as needed (pain). Remove & Discard patch within 12 hours or as directed by MD   methocarbamol 500 MG tablet Commonly known as: ROBAXIN Take 500 mg by mouth 2 (two) times daily as needed for muscle spasms.   metoprolol tartrate 100 MG tablet Commonly known as: LOPRESSOR Take 1 tablet (100 mg total) by mouth 2 (two) times daily.   mometasone 0.1 % cream Commonly known as: ELOCON Apply 1 application topically daily.   montelukast 10 MG tablet Commonly known as: SINGULAIR Take 1 tablet (10 mg total) by mouth at bedtime.   multivitamin with minerals Tabs tablet Take 1 tablet by mouth daily with supper.   Nasonex 50 MCG/ACT nasal spray Generic drug:  mometasone Place 2 sprays into the nose daily.   Protonix 40 MG tablet Generic drug: pantoprazole Take 40 mg by mouth daily.   Spiriva Respimat 2.5 MCG/ACT Aers Generic drug: Tiotropium Bromide Monohydrate Inhale into the lungs.   topiramate 25 MG tablet Commonly known as: TOPAMAX Take 2 tablets (50 mg total) by mouth 2 (two) times daily.   TURMERIC PO Take 900 mg by mouth 3 (three) times daily with meals.   venlafaxine XR 75 MG 24 hr capsule Commonly known as: EFFEXOR-XR Take 1 capsule (75 mg total) by mouth daily with breakfast.       Exam BP 122/80 (BP Location: Left Arm, Patient Position: Sitting, Cuff Size: Normal)   Pulse (!) 102   Temp (!) 96 F (35.6 C) (Temporal)   Ht 5\' 4"  (1.626 m)   Wt 178 lb 8 oz (81 kg)  SpO2 95%   BMI 30.64 kg/m  General:  well developed, well nourished, in no apparent distress Lungs:  clear to auscultation, breath sounds equal bilaterally, no respiratory distress Cardio:  regular rate and rhythm without murmurs, heart sounds without clicks or rubs Psych: well oriented with normal range of affect and age-appropriate judgement/insight, alert and oriented x4.  Assessment and Plan  Mild single current episode of major depressive disorder ([place]) - Plan: buPROPion (WELLBUTRIN XL) 150 MG 24 hr tablet  GAD (generalized anxiety disorder)  Pulmonary nodule  1/2- Cont Effexor. Add above. Cont w counseling. Will consider psych at next visit. TCA, Remeron, BuSpar also to be considered.  3- Recall letter placed for repeat CT chest in 3 mo. Nothing urgent needed at this time.  F/u in 6 weeks. The patient voiced understanding and agreement to the plan.  [place], DO 06/20/19 2:54 PM

## 2019-06-20 NOTE — Patient Instructions (Signed)
We will follow up on your scan in 3 months and go from there.  Stay on the Effexor. We are adding a new medication.  Let us know if you need anything.

## 2019-06-28 DIAGNOSIS — H2513 Age-related nuclear cataract, bilateral: Secondary | ICD-10-CM | POA: Diagnosis not present

## 2019-06-28 DIAGNOSIS — H20042 Secondary noninfectious iridocyclitis, left eye: Secondary | ICD-10-CM | POA: Diagnosis not present

## 2019-06-28 DIAGNOSIS — H04123 Dry eye syndrome of bilateral lacrimal glands: Secondary | ICD-10-CM | POA: Diagnosis not present

## 2019-06-28 DIAGNOSIS — H25013 Cortical age-related cataract, bilateral: Secondary | ICD-10-CM | POA: Diagnosis not present

## 2019-07-02 DIAGNOSIS — M159 Polyosteoarthritis, unspecified: Secondary | ICD-10-CM | POA: Diagnosis not present

## 2019-07-02 DIAGNOSIS — Z880 Allergy status to penicillin: Secondary | ICD-10-CM | POA: Diagnosis not present

## 2019-07-02 DIAGNOSIS — Z7984 Long term (current) use of oral hypoglycemic drugs: Secondary | ICD-10-CM | POA: Diagnosis not present

## 2019-07-02 DIAGNOSIS — M4726 Other spondylosis with radiculopathy, lumbar region: Secondary | ICD-10-CM | POA: Diagnosis not present

## 2019-07-02 DIAGNOSIS — M5136 Other intervertebral disc degeneration, lumbar region: Secondary | ICD-10-CM | POA: Diagnosis not present

## 2019-07-02 DIAGNOSIS — M48061 Spinal stenosis, lumbar region without neurogenic claudication: Secondary | ICD-10-CM | POA: Diagnosis not present

## 2019-07-02 DIAGNOSIS — G894 Chronic pain syndrome: Secondary | ICD-10-CM | POA: Diagnosis not present

## 2019-07-02 DIAGNOSIS — Z885 Allergy status to narcotic agent status: Secondary | ICD-10-CM | POA: Diagnosis not present

## 2019-07-02 DIAGNOSIS — M7918 Myalgia, other site: Secondary | ICD-10-CM | POA: Diagnosis not present

## 2019-07-02 DIAGNOSIS — Z79899 Other long term (current) drug therapy: Secondary | ICD-10-CM | POA: Diagnosis not present

## 2019-07-04 DIAGNOSIS — R69 Illness, unspecified: Secondary | ICD-10-CM | POA: Diagnosis not present

## 2019-07-05 DIAGNOSIS — H35373 Puckering of macula, bilateral: Secondary | ICD-10-CM | POA: Diagnosis not present

## 2019-07-05 DIAGNOSIS — H2513 Age-related nuclear cataract, bilateral: Secondary | ICD-10-CM | POA: Diagnosis not present

## 2019-07-05 DIAGNOSIS — H04123 Dry eye syndrome of bilateral lacrimal glands: Secondary | ICD-10-CM | POA: Diagnosis not present

## 2019-07-05 DIAGNOSIS — H2511 Age-related nuclear cataract, right eye: Secondary | ICD-10-CM | POA: Diagnosis not present

## 2019-07-05 DIAGNOSIS — H25013 Cortical age-related cataract, bilateral: Secondary | ICD-10-CM | POA: Diagnosis not present

## 2019-07-16 ENCOUNTER — Encounter: Payer: Self-pay | Admitting: Family Medicine

## 2019-08-01 ENCOUNTER — Ambulatory Visit: Payer: Medicare HMO | Admitting: Family Medicine

## 2019-08-08 DIAGNOSIS — H25811 Combined forms of age-related cataract, right eye: Secondary | ICD-10-CM | POA: Diagnosis not present

## 2019-08-08 DIAGNOSIS — H2511 Age-related nuclear cataract, right eye: Secondary | ICD-10-CM | POA: Diagnosis not present

## 2019-08-14 DIAGNOSIS — H2512 Age-related nuclear cataract, left eye: Secondary | ICD-10-CM | POA: Diagnosis not present

## 2019-08-14 DIAGNOSIS — H25012 Cortical age-related cataract, left eye: Secondary | ICD-10-CM | POA: Diagnosis not present

## 2019-08-20 DIAGNOSIS — M2550 Pain in unspecified joint: Secondary | ICD-10-CM | POA: Diagnosis not present

## 2019-08-20 DIAGNOSIS — M48061 Spinal stenosis, lumbar region without neurogenic claudication: Secondary | ICD-10-CM | POA: Diagnosis not present

## 2019-08-20 DIAGNOSIS — M47812 Spondylosis without myelopathy or radiculopathy, cervical region: Secondary | ICD-10-CM | POA: Diagnosis not present

## 2019-08-20 DIAGNOSIS — M545 Low back pain: Secondary | ICD-10-CM | POA: Diagnosis not present

## 2019-08-20 DIAGNOSIS — M5136 Other intervertebral disc degeneration, lumbar region: Secondary | ICD-10-CM | POA: Diagnosis not present

## 2019-08-20 DIAGNOSIS — G894 Chronic pain syndrome: Secondary | ICD-10-CM | POA: Diagnosis not present

## 2019-09-05 ENCOUNTER — Other Ambulatory Visit: Payer: Self-pay

## 2019-09-05 ENCOUNTER — Encounter: Payer: Self-pay | Admitting: Family Medicine

## 2019-09-05 ENCOUNTER — Ambulatory Visit (INDEPENDENT_AMBULATORY_CARE_PROVIDER_SITE_OTHER): Payer: Medicare HMO | Admitting: Family Medicine

## 2019-09-05 VITALS — BP 120/80 | HR 63 | Temp 98.5°F | Ht 63.0 in | Wt 181.2 lb

## 2019-09-05 DIAGNOSIS — M542 Cervicalgia: Secondary | ICD-10-CM | POA: Diagnosis not present

## 2019-09-05 MED ORDER — METHOCARBAMOL 500 MG PO TABS: 500.0000 mg | ORAL_TABLET | Freq: Three times a day (TID) | ORAL | 2 refills | Status: DC | PRN

## 2019-09-05 NOTE — Progress Notes (Signed)
Chief Complaint  Patient presents with  . Sore Throat  . Headache    Kristina Patel here for URI complaints.  Duration: 2 weeks  Associated symptoms: sore throat and swollen LN's Denies: sinus congestion, sinus pain, rhinorrhea, itchy watery eyes, ear pain, ear drainage, wheezing, fevers, new cough, and shortness of breath Treatment to date: Tylenol, Advil Sick contacts: No  Past Medical History:  Diagnosis Date  . Bacterial overgrowth syndrome   . Breast mass   . Carpal tunnel syndrome   . Colon polyps   . Degenerative disc disease   . Depression   . Fall   . GERD (gastroesophageal reflux disease)   . Gouty arthropathy   . Headache   . Hyperlipidemia   . Hypertension   . IBS (irritable bowel syndrome)    constipation  . Internal prolapsed hemorrhoids   . Iron deficiency anemia   . Melanoma ([place])   . Osteoarthritis   . Osteoporosis   . Ovarian cancer ([place])   . Pelvic floor dysfunction 05/05/2016  . Peripheral neuropathy   . Prediabetes   . Pulmonary nodules   . Renal disease   . Rheumatoid arthritis(714.0)   . Squamous cell carcinoma skin of abdomen   . Uterine cancer ([place])   . Vitamin D deficiency     BP 120/80 (BP Location: Left Arm, Patient Position: Sitting, Cuff Size: Normal)   Pulse 63   Temp 98.5 F (36.9 C) (Oral)   Ht 5\' 3"  (1.6 m)   Wt 181 lb 4 oz (82.2 kg)   SpO2 97%   BMI 32.11 kg/m  General: Awake, alert, appears stated age HEENT: AT, [place], ears patent b/l and TM's neg, nares patent w/o discharge, pharynx pink and without exudates, MMM Neck: No masses or asymmetry Heart: RRR MSK: Tenderness to palpation over the SCM muscles bilaterally and posterior cervical spinal musculature Neuro: No cerebellar signs, DTRs equal and symmetric in the upper extremities without clonus Lungs: CTAB, no accessory muscle use Psych: Age appropriate judgment and insight, normal mood and affect  Neck pain - Plan: methocarbamol (ROBAXIN) 500 MG tablet  I do not  think this is allergic or infectious.  She is tender over various muscle bodies in the neck region.  It does not hurt when she swallows and she has a normal ENT exam.  No constitutional symptoms to suggest bacterial infection.  Continue ibuprofen sparingly and Tylenol.  Heat, ice, stretches and exercises for the neck.  If no improvement will consider physical therapy. Pt voiced understanding and agreement to the plan.  [place], DO 09/05/19 9:14 AM

## 2019-09-05 NOTE — Patient Instructions (Addendum)
Heat (pad or rice pillow in microwave) over affected area, 10-15 minutes twice daily.   Ice/cold pack over area for 10-15 min twice daily.  OK to take Tylenol 1000 mg (2 extra strength tabs) or 975 mg (3 regular strength tabs) every 6 hours as needed.  Ibuprofen 400-600 mg (2-3 over the counter strength tabs) every 6 hours as needed for pain.  EXERCISES RANGE OF MOTION (ROM) AND STRETCHING EXERCISES  These exercises may help you when beginning to rehabilitate your issue. In order to successfully resolve your symptoms, you must improve your posture. These exercises are designed to help reduce the forward-head and rounded-shoulder posture which contributes to this condition. Your symptoms may resolve with or without further involvement from your physician, physical therapist or athletic trainer. While completing these exercises, remember:   Restoring tissue flexibility helps normal motion to return to the joints. This allows healthier, less painful movement and activity.  An effective stretch should be held for at least 20 seconds, although you may need to begin with shorter hold times for comfort.  A stretch should never be painful. You should only feel a gentle lengthening or release in the stretched tissue.  Do not do any stretch or exercise that you cannot tolerate.  STRETCH- Axial Extensors  Lie on your back on the floor. You may bend your knees for comfort. Place a rolled-up hand towel or dish towel, about 2 inches in diameter, under the part of your head that makes contact with the floor.  Gently tuck your chin, as if trying to make a "double chin," until you feel a gentle stretch at the base of your head.  Hold 15-20 seconds. Repeat 2-3 times. Complete this exercise 1 time per day.   STRETCH - Axial Extension   Stand or sit on a firm surface. Assume a good posture: chest up, shoulders drawn back, abdominal muscles slightly tense, knees unlocked (if standing) and feet hip width  apart.  Slowly retract your chin so your head slides back and your chin slightly lowers. Continue to look straight ahead.  You should feel a gentle stretch in the back of your head. Be certain not to feel an aggressive stretch since this can cause headaches later.  Hold for 15-20 seconds. Repeat 2-3 times. Complete this exercise 1 time per day.  STRETCH - Cervical Side Bend   Stand or sit on a firm surface. Assume a good posture: chest up, shoulders drawn back, abdominal muscles slightly tense, knees unlocked (if standing) and feet hip width apart.  Without letting your nose or shoulders move, slowly tip your right / left ear to your shoulder until your feel a gentle stretch in the muscles on the opposite side of your neck.  Hold 15-20 seconds. Repeat 2-3 times. Complete this exercise 1-2 times per day.  STRETCH - Cervical Rotators   Stand or sit on a firm surface. Assume a good posture: chest up, shoulders drawn back, abdominal muscles slightly tense, knees unlocked (if standing) and feet hip width apart.  Keeping your eyes level with the ground, slowly turn your head until you feel a gentle stretch along the back and opposite side of your neck.  Hold 15-20 seconds. Repeat 2-3 times. Complete this exercise 1-2 times per day.  RANGE OF MOTION - Neck Circles   Stand or sit on a firm surface. Assume a good posture: chest up, shoulders drawn back, abdominal muscles slightly tense, knees unlocked (if standing) and feet hip width apart.  Gently roll  your head down and around from the back of one shoulder to the back of the other. The motion should never be forced or painful.  Repeat the motion 10-20 times, or until you feel the neck muscles relax and loosen. Repeat 2-3 times. Complete the exercise 1-2 times per day. STRENGTHENING EXERCISES - Cervical Strain and Sprain These exercises may help you when beginning to rehabilitate your injury. They may resolve your symptoms with or without  further involvement from your physician, physical therapist, or athletic trainer. While completing these exercises, remember:   Muscles can gain both the endurance and the strength needed for everyday activities through controlled exercises.  Complete these exercises as instructed by your physician, physical therapist, or athletic trainer. Progress the resistance and repetitions only as guided.  You may experience muscle soreness or fatigue, but the pain or discomfort you are trying to eliminate should never worsen during these exercises. If this pain does worsen, stop and make certain you are following the directions exactly. If the pain is still present after adjustments, discontinue the exercise until you can discuss the trouble with your clinician.  STRENGTH - Cervical Flexors, Isometric  Face a wall, standing about 6 inches away. Place a small pillow, a ball about 6-8 inches in diameter, or a folded towel between your forehead and the wall.  Slightly tuck your chin and gently push your forehead into the soft object. Push only with mild to moderate intensity, building up tension gradually. Keep your jaw and forehead relaxed.  Hold 10 to 20 seconds. Keep your breathing relaxed.  Release the tension slowly. Relax your neck muscles completely before you start the next repetition. Repeat 2-3 times. Complete this exercise 1 time per day.  STRENGTH- Cervical Lateral Flexors, Isometric   Stand about 6 inches away from a wall. Place a small pillow, a ball about 6-8 inches in diameter, or a folded towel between the side of your head and the wall.  Slightly tuck your chin and gently tilt your head into the soft object. Push only with mild to moderate intensity, building up tension gradually. Keep your jaw and forehead relaxed.  Hold 10 to 20 seconds. Keep your breathing relaxed.  Release the tension slowly. Relax your neck muscles completely before you start the next repetition. Repeat 2-3  times. Complete this exercise 1 time per day.  STRENGTH - Cervical Extensors, Isometric   Stand about 6 inches away from a wall. Place a small pillow, a ball about 6-8 inches in diameter, or a folded towel between the back of your head and the wall.  Slightly tuck your chin and gently tilt your head back into the soft object. Push only with mild to moderate intensity, building up tension gradually. Keep your jaw and forehead relaxed.  Hold 10 to 20 seconds. Keep your breathing relaxed.  Release the tension slowly. Relax your neck muscles completely before you start the next repetition. Repeat 2-3 times. Complete this exercise 1 time per day.  POSTURE AND BODY MECHANICS CONSIDERATIONS Keeping correct posture when sitting, standing or completing your activities will reduce the stress put on different body tissues, allowing injured tissues a chance to heal and limiting painful experiences. The following are general guidelines for improved posture. Your physician or physical therapist will provide you with any instructions specific to your needs. While reading these guidelines, remember:  The exercises prescribed by your provider will help you have the flexibility and strength to maintain correct postures.  The correct posture provides the  optimal environment for your joints to work. All of your joints have less wear and tear when properly supported by a spine with good posture. This means you will experience a healthier, less painful body.  Correct posture must be practiced with all of your activities, especially prolonged sitting and standing. Correct posture is as important when doing repetitive low-stress activities (typing) as it is when doing a single heavy-load activity (lifting).  PROLONGED STANDING WHILE SLIGHTLY LEANING FORWARD When completing a task that requires you to lean forward while standing in one place for a long time, place either foot up on a stationary 2- to 4-inch high  object to help maintain the best posture. When both feet are on the ground, the low back tends to lose its slight inward curve. If this curve flattens (or becomes too large), then the back and your other joints will experience too much stress, fatigue more quickly, and can cause pain.   RESTING POSITIONS Consider which positions are most painful for you when choosing a resting position. If you have pain with flexion-based activities (sitting, bending, stooping, squatting), choose a position that allows you to rest in a less flexed posture. You would want to avoid curling into a fetal position on your side. If your pain worsens with extension-based activities (prolonged standing, working overhead), avoid resting in an extended position such as sleeping on your stomach. Most people will find more comfort when they rest with their spine in a more neutral position, neither too rounded nor too arched. Lying on a non-sagging bed on your side with a pillow between your knees, or on your back with a pillow under your knees will often provide some relief. Keep in mind, being in any one position for a prolonged period of time, no matter how correct your posture, can still lead to stiffness.  WALKING Walk with an upright posture. Your ears, shoulders, and hips should all line up. OFFICE WORK When working at a desk, create an environment that supports good, upright posture. Without extra support, muscles fatigue and lead to excessive strain on joints and other tissues.  CHAIR:  A chair should be able to slide under your desk when your back makes contact with the back of the chair. This allows you to work closely.  The chair's height should allow your eyes to be level with the upper part of your monitor and your hands to be slightly lower than your elbows.  Body position: ? Your feet should make contact with the floor. If this is not possible, use a foot rest. ? Keep your ears over your shoulders. This will  reduce stress on your neck and low back.

## 2019-09-14 ENCOUNTER — Ambulatory Visit: Payer: Medicare HMO | Admitting: Podiatry

## 2019-09-25 DIAGNOSIS — M7522 Bicipital tendinitis, left shoulder: Secondary | ICD-10-CM | POA: Diagnosis not present

## 2019-09-25 DIAGNOSIS — M19012 Primary osteoarthritis, left shoulder: Secondary | ICD-10-CM | POA: Diagnosis not present

## 2019-10-24 DIAGNOSIS — H25812 Combined forms of age-related cataract, left eye: Secondary | ICD-10-CM | POA: Diagnosis not present

## 2019-10-24 DIAGNOSIS — H25012 Cortical age-related cataract, left eye: Secondary | ICD-10-CM | POA: Diagnosis not present

## 2019-10-24 DIAGNOSIS — H2512 Age-related nuclear cataract, left eye: Secondary | ICD-10-CM | POA: Diagnosis not present

## 2019-11-14 DIAGNOSIS — Z85820 Personal history of malignant melanoma of skin: Secondary | ICD-10-CM | POA: Diagnosis not present

## 2019-11-14 DIAGNOSIS — R432 Parageusia: Secondary | ICD-10-CM | POA: Diagnosis not present

## 2019-11-14 DIAGNOSIS — R1012 Left upper quadrant pain: Secondary | ICD-10-CM | POA: Diagnosis not present

## 2019-11-14 DIAGNOSIS — R1936 Epigastric abdominal rigidity: Secondary | ICD-10-CM | POA: Diagnosis not present

## 2019-11-14 DIAGNOSIS — Z8543 Personal history of malignant neoplasm of ovary: Secondary | ICD-10-CM | POA: Diagnosis not present

## 2019-11-14 DIAGNOSIS — Z8542 Personal history of malignant neoplasm of other parts of uterus: Secondary | ICD-10-CM | POA: Diagnosis not present

## 2019-11-14 DIAGNOSIS — R102 Pelvic and perineal pain: Secondary | ICD-10-CM | POA: Diagnosis not present

## 2019-11-19 ENCOUNTER — Other Ambulatory Visit: Payer: Self-pay

## 2019-11-19 ENCOUNTER — Ambulatory Visit: Payer: Medicare HMO | Admitting: Podiatry

## 2019-11-19 DIAGNOSIS — L84 Corns and callosities: Secondary | ICD-10-CM | POA: Diagnosis not present

## 2019-11-19 DIAGNOSIS — B351 Tinea unguium: Secondary | ICD-10-CM

## 2019-11-19 DIAGNOSIS — E1149 Type 2 diabetes mellitus with other diabetic neurological complication: Secondary | ICD-10-CM | POA: Diagnosis not present

## 2019-11-19 DIAGNOSIS — M79674 Pain in right toe(s): Secondary | ICD-10-CM

## 2019-11-19 DIAGNOSIS — M79675 Pain in left toe(s): Secondary | ICD-10-CM | POA: Diagnosis not present

## 2019-11-20 NOTE — Progress Notes (Signed)
Subjective: 74 year old female presents the office today for concerns of thick, elongated, painful nails that she cannot trim himself as well as for calluses.  She denies any redness or drainage from the toenail sites or calluses.  No fevers, chills, nausea, vomiting.  No calf pain, chest pain, shortness of breath.    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 Sensation decreased with Derrel Nip monofilament Nails are hypertrophic, dystrophic, discolored x9.   Hyperkeratotic lesions bilateral submetatarsal 1 and right submetatarsal 2.  First metatarsal heads plantarly with atrophy of the fat pad. No other open lesions or pre-ulcerative lesions.  No pain with calf compression, swelling, warmth, erythema No other acute changes compared to last appointment.  Assessment: 74 year old female seen for onychomycosis, hyperkeratotic lesions  Plan: -All treatment options discussed with the patient including all alternatives, risks, complications.  -Nails sharply debrided x9 without any complications or bleeding. -Hyperkeratotic lesion sharply today x2 without any complications or bleeding -Offloading pads dispensed -Monitor for any clinical signs or symptoms of infection and directed to call the office immediately should any occur or go to the ER.  RTC 3 months or sooner if needed  [name] DPM

## 2019-12-04 ENCOUNTER — Other Ambulatory Visit: Payer: Self-pay | Admitting: Family Medicine

## 2019-12-05 NOTE — Telephone Encounter (Signed)
Last OV--09/05/2019 Last RF---05/28/2019   #60 with 5 refills UDS/CSC done on 05/08/2019

## 2019-12-27 DIAGNOSIS — M5416 Radiculopathy, lumbar region: Secondary | ICD-10-CM | POA: Diagnosis not present

## 2019-12-27 DIAGNOSIS — M5136 Other intervertebral disc degeneration, lumbar region: Secondary | ICD-10-CM | POA: Diagnosis not present

## 2019-12-27 DIAGNOSIS — M47812 Spondylosis without myelopathy or radiculopathy, cervical region: Secondary | ICD-10-CM | POA: Diagnosis not present

## 2019-12-27 DIAGNOSIS — M2550 Pain in unspecified joint: Secondary | ICD-10-CM | POA: Diagnosis not present

## 2019-12-27 DIAGNOSIS — M4807 Spinal stenosis, lumbosacral region: Secondary | ICD-10-CM | POA: Diagnosis not present

## 2020-01-01 DIAGNOSIS — H04123 Dry eye syndrome of bilateral lacrimal glands: Secondary | ICD-10-CM | POA: Diagnosis not present

## 2020-01-01 DIAGNOSIS — H00022 Hordeolum internum right lower eyelid: Secondary | ICD-10-CM | POA: Diagnosis not present

## 2020-01-21 DIAGNOSIS — Z881 Allergy status to other antibiotic agents status: Secondary | ICD-10-CM | POA: Diagnosis not present

## 2020-01-21 DIAGNOSIS — M5116 Intervertebral disc disorders with radiculopathy, lumbar region: Secondary | ICD-10-CM | POA: Diagnosis not present

## 2020-01-21 DIAGNOSIS — M47812 Spondylosis without myelopathy or radiculopathy, cervical region: Secondary | ICD-10-CM | POA: Diagnosis not present

## 2020-01-21 DIAGNOSIS — Z79899 Other long term (current) drug therapy: Secondary | ICD-10-CM | POA: Diagnosis not present

## 2020-01-21 DIAGNOSIS — Z885 Allergy status to narcotic agent status: Secondary | ICD-10-CM | POA: Diagnosis not present

## 2020-01-21 DIAGNOSIS — G894 Chronic pain syndrome: Secondary | ICD-10-CM | POA: Diagnosis not present

## 2020-01-21 DIAGNOSIS — Z7984 Long term (current) use of oral hypoglycemic drugs: Secondary | ICD-10-CM | POA: Diagnosis not present

## 2020-01-21 DIAGNOSIS — M4726 Other spondylosis with radiculopathy, lumbar region: Secondary | ICD-10-CM | POA: Diagnosis not present

## 2020-01-21 DIAGNOSIS — M5136 Other intervertebral disc degeneration, lumbar region: Secondary | ICD-10-CM | POA: Diagnosis not present

## 2020-01-21 DIAGNOSIS — M48061 Spinal stenosis, lumbar region without neurogenic claudication: Secondary | ICD-10-CM | POA: Diagnosis not present

## 2020-01-21 DIAGNOSIS — Z880 Allergy status to penicillin: Secondary | ICD-10-CM | POA: Diagnosis not present

## 2020-01-22 DIAGNOSIS — R109 Unspecified abdominal pain: Secondary | ICD-10-CM | POA: Diagnosis not present

## 2020-01-22 DIAGNOSIS — R1012 Left upper quadrant pain: Secondary | ICD-10-CM | POA: Diagnosis not present

## 2020-01-22 DIAGNOSIS — R140 Abdominal distension (gaseous): Secondary | ICD-10-CM | POA: Diagnosis not present

## 2020-01-25 ENCOUNTER — Other Ambulatory Visit: Payer: Self-pay

## 2020-01-25 ENCOUNTER — Ambulatory Visit: Payer: Medicare HMO | Admitting: Podiatry

## 2020-01-25 DIAGNOSIS — M79674 Pain in right toe(s): Secondary | ICD-10-CM

## 2020-01-25 DIAGNOSIS — M79675 Pain in left toe(s): Secondary | ICD-10-CM | POA: Diagnosis not present

## 2020-01-25 DIAGNOSIS — B351 Tinea unguium: Secondary | ICD-10-CM | POA: Diagnosis not present

## 2020-01-25 DIAGNOSIS — E1149 Type 2 diabetes mellitus with other diabetic neurological complication: Secondary | ICD-10-CM

## 2020-01-25 DIAGNOSIS — L84 Corns and callosities: Secondary | ICD-10-CM | POA: Diagnosis not present

## 2020-01-28 DIAGNOSIS — M19012 Primary osteoarthritis, left shoulder: Secondary | ICD-10-CM | POA: Diagnosis not present

## 2020-01-28 DIAGNOSIS — M7522 Bicipital tendinitis, left shoulder: Secondary | ICD-10-CM | POA: Diagnosis not present

## 2020-01-30 NOTE — Progress Notes (Signed)
Subjective: 74 year old female presents the office today for concerns of thick, elongated, painful nails that she cannot trim himself as well as for calluses.  She denies any redness or drainage from the toenail sites or calluses.  No fevers, chills, nausea, vomiting.  No calf pain, chest pain, shortness of breath.    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 Sensation decreased with Derrel Nip monofilament Nails are hypertrophic, dystrophic, discolored x9.   Hyperkeratotic lesions bilateral submetatarsal 1 and right submetatarsal 2.  First metatarsal heads plantarly with atrophy of the fat pad. Hammertoes present.  No other open lesions or pre-ulcerative lesions.  No pain with calf compression, swelling, warmth, erythema  Assessment: 74 year old female seen for onychomycosis, hyperkeratotic lesions  Plan: -All treatment options discussed with the patient including all alternatives, risks, complications.  -Nails sharply debrided x9 without any complications or bleeding. -Hyperkeratotic lesion sharply today x2 without any complications or bleeding -Offloading pads dispensed -Monitor for any clinical signs or symptoms of infection and directed to call the office immediately should any occur or go to the ER.  RTC 3 months or sooner if needed  [name] DPM

## 2020-02-05 DIAGNOSIS — Z885 Allergy status to narcotic agent status: Secondary | ICD-10-CM | POA: Diagnosis not present

## 2020-02-05 DIAGNOSIS — G894 Chronic pain syndrome: Secondary | ICD-10-CM | POA: Diagnosis not present

## 2020-02-05 DIAGNOSIS — Z888 Allergy status to other drugs, medicaments and biological substances status: Secondary | ICD-10-CM | POA: Diagnosis not present

## 2020-02-05 DIAGNOSIS — M5136 Other intervertebral disc degeneration, lumbar region: Secondary | ICD-10-CM | POA: Diagnosis not present

## 2020-02-05 DIAGNOSIS — M47812 Spondylosis without myelopathy or radiculopathy, cervical region: Secondary | ICD-10-CM | POA: Diagnosis not present

## 2020-02-05 DIAGNOSIS — Z880 Allergy status to penicillin: Secondary | ICD-10-CM | POA: Diagnosis not present

## 2020-02-08 ENCOUNTER — Ambulatory Visit: Payer: Medicare HMO | Admitting: Podiatry

## 2020-03-18 DIAGNOSIS — M47812 Spondylosis without myelopathy or radiculopathy, cervical region: Secondary | ICD-10-CM | POA: Diagnosis not present

## 2020-03-18 DIAGNOSIS — Z885 Allergy status to narcotic agent status: Secondary | ICD-10-CM | POA: Diagnosis not present

## 2020-03-18 DIAGNOSIS — G894 Chronic pain syndrome: Secondary | ICD-10-CM | POA: Diagnosis not present

## 2020-03-18 DIAGNOSIS — Z79899 Other long term (current) drug therapy: Secondary | ICD-10-CM | POA: Diagnosis not present

## 2020-03-18 DIAGNOSIS — M48061 Spinal stenosis, lumbar region without neurogenic claudication: Secondary | ICD-10-CM | POA: Diagnosis not present

## 2020-03-18 DIAGNOSIS — M7918 Myalgia, other site: Secondary | ICD-10-CM | POA: Diagnosis not present

## 2020-03-18 DIAGNOSIS — Z7984 Long term (current) use of oral hypoglycemic drugs: Secondary | ICD-10-CM | POA: Diagnosis not present

## 2020-03-18 DIAGNOSIS — M5116 Intervertebral disc disorders with radiculopathy, lumbar region: Secondary | ICD-10-CM | POA: Diagnosis not present

## 2020-03-18 DIAGNOSIS — M4726 Other spondylosis with radiculopathy, lumbar region: Secondary | ICD-10-CM | POA: Diagnosis not present

## 2020-04-25 ENCOUNTER — Other Ambulatory Visit: Payer: Self-pay

## 2020-04-25 ENCOUNTER — Ambulatory Visit (INDEPENDENT_AMBULATORY_CARE_PROVIDER_SITE_OTHER): Payer: Medicare HMO | Admitting: Podiatry

## 2020-04-25 DIAGNOSIS — M79674 Pain in right toe(s): Secondary | ICD-10-CM

## 2020-04-25 DIAGNOSIS — M79675 Pain in left toe(s): Secondary | ICD-10-CM

## 2020-04-25 DIAGNOSIS — E1149 Type 2 diabetes mellitus with other diabetic neurological complication: Secondary | ICD-10-CM | POA: Diagnosis not present

## 2020-04-25 DIAGNOSIS — B351 Tinea unguium: Secondary | ICD-10-CM

## 2020-04-25 DIAGNOSIS — L84 Corns and callosities: Secondary | ICD-10-CM | POA: Diagnosis not present

## 2020-04-25 NOTE — Progress Notes (Signed)
Subjective: 75 year old female presents the office today for concerns of thick, elongated, painful nails that she cannot trim himself as well as for calluses.  She denies any redness or drainage from the toenail sites or calluses.  No fevers, chills, nausea, vomiting.  No calf pain, chest pain, shortness of breath.    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 Sensation decreased with Derrel Nip monofilament Nails are hypertrophic, dystrophic, discolored x9.   Hyperkeratotic lesions bilateral submetatarsal 1 and right submetatarsal 2.  Prominence of the first metatarsal heads plantarly with atrophy of the fat pad. Hammertoes present.  Preulcerative area in the dorsal second toe but there is no definitive skin breakdown. No edema, erythema or signs of infection.  No pain with calf compression, swelling, warmth, erythema  Assessment: 75 year old female seen for onychomycosis, hyperkeratotic lesions; preulcerative lesion  Plan: -All treatment options discussed with the patient including all alternatives, risks, complications.  -Nails sharply debrided x9 without any complications or bleeding. -Hyperkeratotic lesion sharply today x2 without any complications or bleeding -Offloading pads dispensed and monitor closely for any further skin breakdown. -Monitor for any clinical signs or symptoms of infection and directed to call the office immediately should any occur or go to the ER.  RTC 3 months or sooner if needed  [name] DPM

## 2020-05-05 DIAGNOSIS — M25552 Pain in left hip: Secondary | ICD-10-CM | POA: Diagnosis not present

## 2020-05-05 DIAGNOSIS — M7061 Trochanteric bursitis, right hip: Secondary | ICD-10-CM | POA: Diagnosis not present

## 2020-05-12 DIAGNOSIS — H04123 Dry eye syndrome of bilateral lacrimal glands: Secondary | ICD-10-CM | POA: Diagnosis not present

## 2020-05-12 DIAGNOSIS — H1131 Conjunctival hemorrhage, right eye: Secondary | ICD-10-CM | POA: Diagnosis not present

## 2020-05-12 DIAGNOSIS — H00022 Hordeolum internum right lower eyelid: Secondary | ICD-10-CM | POA: Diagnosis not present

## 2020-05-13 DIAGNOSIS — Z79899 Other long term (current) drug therapy: Secondary | ICD-10-CM | POA: Diagnosis not present

## 2020-05-13 DIAGNOSIS — Z888 Allergy status to other drugs, medicaments and biological substances status: Secondary | ICD-10-CM | POA: Diagnosis not present

## 2020-05-13 DIAGNOSIS — M5136 Other intervertebral disc degeneration, lumbar region: Secondary | ICD-10-CM | POA: Diagnosis not present

## 2020-05-13 DIAGNOSIS — M47812 Spondylosis without myelopathy or radiculopathy, cervical region: Secondary | ICD-10-CM | POA: Diagnosis not present

## 2020-05-13 DIAGNOSIS — M130 Polyarthritis, unspecified: Secondary | ICD-10-CM | POA: Diagnosis not present

## 2020-05-13 DIAGNOSIS — Z882 Allergy status to sulfonamides status: Secondary | ICD-10-CM | POA: Diagnosis not present

## 2020-05-13 DIAGNOSIS — Z880 Allergy status to penicillin: Secondary | ICD-10-CM | POA: Diagnosis not present

## 2020-06-27 ENCOUNTER — Ambulatory Visit: Payer: Medicare HMO | Admitting: Podiatry

## 2020-07-04 ENCOUNTER — Other Ambulatory Visit: Payer: Self-pay

## 2020-07-04 ENCOUNTER — Encounter: Payer: Self-pay | Admitting: Podiatry

## 2020-07-04 ENCOUNTER — Ambulatory Visit: Payer: Medicare HMO | Admitting: Podiatry

## 2020-07-04 DIAGNOSIS — M79675 Pain in left toe(s): Secondary | ICD-10-CM

## 2020-07-04 DIAGNOSIS — M810 Age-related osteoporosis without current pathological fracture: Secondary | ICD-10-CM | POA: Insufficient documentation

## 2020-07-04 DIAGNOSIS — I10 Essential (primary) hypertension: Secondary | ICD-10-CM | POA: Insufficient documentation

## 2020-07-04 DIAGNOSIS — M79674 Pain in right toe(s): Secondary | ICD-10-CM | POA: Diagnosis not present

## 2020-07-04 DIAGNOSIS — E1149 Type 2 diabetes mellitus with other diabetic neurological complication: Secondary | ICD-10-CM

## 2020-07-04 DIAGNOSIS — Z23 Encounter for immunization: Secondary | ICD-10-CM | POA: Insufficient documentation

## 2020-07-04 DIAGNOSIS — E669 Obesity, unspecified: Secondary | ICD-10-CM | POA: Insufficient documentation

## 2020-07-04 DIAGNOSIS — Z7689 Persons encountering health services in other specified circumstances: Secondary | ICD-10-CM | POA: Insufficient documentation

## 2020-07-04 DIAGNOSIS — G608 Other hereditary and idiopathic neuropathies: Secondary | ICD-10-CM | POA: Insufficient documentation

## 2020-07-04 DIAGNOSIS — M792 Neuralgia and neuritis, unspecified: Secondary | ICD-10-CM | POA: Insufficient documentation

## 2020-07-04 DIAGNOSIS — F331 Major depressive disorder, recurrent, moderate: Secondary | ICD-10-CM | POA: Insufficient documentation

## 2020-07-04 DIAGNOSIS — E559 Vitamin D deficiency, unspecified: Secondary | ICD-10-CM | POA: Insufficient documentation

## 2020-07-04 DIAGNOSIS — B351 Tinea unguium: Secondary | ICD-10-CM

## 2020-07-04 DIAGNOSIS — M0589 Other rheumatoid arthritis with rheumatoid factor of multiple sites: Secondary | ICD-10-CM | POA: Insufficient documentation

## 2020-07-04 DIAGNOSIS — N6320 Unspecified lump in the left breast, unspecified quadrant: Secondary | ICD-10-CM | POA: Insufficient documentation

## 2020-07-04 DIAGNOSIS — R918 Other nonspecific abnormal finding of lung field: Secondary | ICD-10-CM | POA: Insufficient documentation

## 2020-07-04 DIAGNOSIS — N6451 Induration of breast: Secondary | ICD-10-CM | POA: Insufficient documentation

## 2020-07-04 DIAGNOSIS — T17318D Gastric contents in larynx causing other injury, subsequent encounter: Secondary | ICD-10-CM | POA: Insufficient documentation

## 2020-07-04 DIAGNOSIS — L84 Corns and callosities: Secondary | ICD-10-CM | POA: Diagnosis not present

## 2020-07-04 DIAGNOSIS — G5600 Carpal tunnel syndrome, unspecified upper limb: Secondary | ICD-10-CM | POA: Insufficient documentation

## 2020-07-04 DIAGNOSIS — G43909 Migraine, unspecified, not intractable, without status migrainosus: Secondary | ICD-10-CM | POA: Insufficient documentation

## 2020-07-04 DIAGNOSIS — E611 Iron deficiency: Secondary | ICD-10-CM | POA: Insufficient documentation

## 2020-07-04 DIAGNOSIS — L649 Androgenic alopecia, unspecified: Secondary | ICD-10-CM | POA: Insufficient documentation

## 2020-07-04 DIAGNOSIS — F1120 Opioid dependence, uncomplicated: Secondary | ICD-10-CM | POA: Insufficient documentation

## 2020-07-04 DIAGNOSIS — E1140 Type 2 diabetes mellitus with diabetic neuropathy, unspecified: Secondary | ICD-10-CM | POA: Insufficient documentation

## 2020-07-04 DIAGNOSIS — R110 Nausea: Secondary | ICD-10-CM | POA: Insufficient documentation

## 2020-07-04 NOTE — Progress Notes (Signed)
Subjective: 75 year old female presents the office today for concerns of thick, elongated, painful nails that she cannot trim himself as well as for calluses.  She denies any redness or drainage from the toenail sites or calluses.  Denies any open lesions.  No fevers, chills, nausea, vomiting.  No calf pain, chest pain, shortness of breath.    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 Sensation decreased with Derrel Nip monofilament Nails are hypertrophic, dystrophic, discolored x9.   Hyperkeratotic lesions bilateral submetatarsal 1 and right submetatarsal 2.  Prominence of the first metatarsal heads plantarly with atrophy of the fat pad. Hammertoes present.  Bunions present.   No edema, erythema or signs of infection. No pain with calf compression, swelling, warmth, erythema  Assessment: 75 year old female seen for onychomycosis, hyperkeratotic lesions  Plan: -All treatment options discussed with the patient including all alternatives, risks, complications.  -Nails sharply debrided x9 without any complications or bleeding. -Hyperkeratotic lesion sharply today x2 without any complications or bleeding -Discussed daily foot inspection. -Monitor for any clinical signs or symptoms of infection and directed to call the office immediately should any occur or go to the ER.  RTC 3 months or sooner if needed  [name] DPM

## 2020-07-07 ENCOUNTER — Other Ambulatory Visit: Payer: Self-pay | Admitting: Family Medicine

## 2020-07-07 NOTE — Telephone Encounter (Signed)
Requesting: alprazolam 0.5mg  Contract: None UDS: None Last Visit: 09/05/2019 Next Visit: None Last Refill: 12/05/2019 #60 and 5RF Pt sig: 1 tab q12h prn  Please Advise

## 2020-07-07 NOTE — Telephone Encounter (Signed)
Mychart message sent.

## 2020-07-08 DIAGNOSIS — M4807 Spinal stenosis, lumbosacral region: Secondary | ICD-10-CM | POA: Diagnosis not present

## 2020-07-08 DIAGNOSIS — M5136 Other intervertebral disc degeneration, lumbar region: Secondary | ICD-10-CM | POA: Diagnosis not present

## 2020-07-08 DIAGNOSIS — M5416 Radiculopathy, lumbar region: Secondary | ICD-10-CM | POA: Diagnosis not present

## 2020-07-08 DIAGNOSIS — M5030 Other cervical disc degeneration, unspecified cervical region: Secondary | ICD-10-CM | POA: Diagnosis not present

## 2020-07-08 DIAGNOSIS — G894 Chronic pain syndrome: Secondary | ICD-10-CM | POA: Diagnosis not present

## 2020-07-17 DIAGNOSIS — M19012 Primary osteoarthritis, left shoulder: Secondary | ICD-10-CM | POA: Diagnosis not present

## 2020-07-25 DIAGNOSIS — D1779 Benign lipomatous neoplasm of other sites: Secondary | ICD-10-CM | POA: Diagnosis not present

## 2020-07-25 DIAGNOSIS — M75111 Incomplete rotator cuff tear or rupture of right shoulder, not specified as traumatic: Secondary | ICD-10-CM | POA: Diagnosis not present

## 2020-07-25 DIAGNOSIS — R2231 Localized swelling, mass and lump, right upper limb: Secondary | ICD-10-CM | POA: Diagnosis not present

## 2020-07-25 DIAGNOSIS — M19011 Primary osteoarthritis, right shoulder: Secondary | ICD-10-CM | POA: Diagnosis not present

## 2020-07-28 DIAGNOSIS — Z79899 Other long term (current) drug therapy: Secondary | ICD-10-CM | POA: Diagnosis not present

## 2020-07-28 DIAGNOSIS — Z881 Allergy status to other antibiotic agents status: Secondary | ICD-10-CM | POA: Diagnosis not present

## 2020-07-28 DIAGNOSIS — Z880 Allergy status to penicillin: Secondary | ICD-10-CM | POA: Diagnosis not present

## 2020-07-28 DIAGNOSIS — M4802 Spinal stenosis, cervical region: Secondary | ICD-10-CM | POA: Diagnosis not present

## 2020-07-28 DIAGNOSIS — Z91048 Other nonmedicinal substance allergy status: Secondary | ICD-10-CM | POA: Diagnosis not present

## 2020-07-28 DIAGNOSIS — Z888 Allergy status to other drugs, medicaments and biological substances status: Secondary | ICD-10-CM | POA: Diagnosis not present

## 2020-07-28 DIAGNOSIS — M5030 Other cervical disc degeneration, unspecified cervical region: Secondary | ICD-10-CM | POA: Diagnosis not present

## 2020-07-28 DIAGNOSIS — M5013 Cervical disc disorder with radiculopathy, cervicothoracic region: Secondary | ICD-10-CM | POA: Diagnosis not present

## 2020-08-06 ENCOUNTER — Ambulatory Visit: Payer: No Typology Code available for payment source | Admitting: Family Medicine

## 2020-08-12 ENCOUNTER — Encounter: Payer: Self-pay | Admitting: Family Medicine

## 2020-08-12 ENCOUNTER — Ambulatory Visit (INDEPENDENT_AMBULATORY_CARE_PROVIDER_SITE_OTHER): Payer: Medicare HMO | Admitting: Family Medicine

## 2020-08-12 ENCOUNTER — Other Ambulatory Visit: Payer: Self-pay

## 2020-08-12 VITALS — BP 140/84 | HR 67 | Temp 98.1°F | Ht 63.0 in | Wt 193.2 lb

## 2020-08-12 DIAGNOSIS — T451X5A Adverse effect of antineoplastic and immunosuppressive drugs, initial encounter: Secondary | ICD-10-CM | POA: Diagnosis not present

## 2020-08-12 DIAGNOSIS — G620 Drug-induced polyneuropathy: Secondary | ICD-10-CM

## 2020-08-12 DIAGNOSIS — M069 Rheumatoid arthritis, unspecified: Secondary | ICD-10-CM

## 2020-08-12 DIAGNOSIS — G8194 Hemiplegia, unspecified affecting left nondominant side: Secondary | ICD-10-CM | POA: Diagnosis not present

## 2020-08-12 DIAGNOSIS — F331 Major depressive disorder, recurrent, moderate: Secondary | ICD-10-CM | POA: Diagnosis not present

## 2020-08-12 DIAGNOSIS — F411 Generalized anxiety disorder: Secondary | ICD-10-CM

## 2020-08-12 DIAGNOSIS — C569 Malignant neoplasm of unspecified ovary: Secondary | ICD-10-CM

## 2020-08-12 DIAGNOSIS — I739 Peripheral vascular disease, unspecified: Secondary | ICD-10-CM | POA: Diagnosis not present

## 2020-08-12 DIAGNOSIS — Z89421 Acquired absence of other right toe(s): Secondary | ICD-10-CM

## 2020-08-12 MED ORDER — SEMAGLUTIDE-WEIGHT MANAGEMENT 0.25 MG/0.5ML ~~LOC~~ SOAJ: 0.2500 mg | SUBCUTANEOUS | 0 refills | Status: AC

## 2020-08-12 MED ORDER — SEMAGLUTIDE-WEIGHT MANAGEMENT 1 MG/0.5ML ~~LOC~~ SOAJ: 1.0000 mg | SUBCUTANEOUS | 0 refills | Status: AC

## 2020-08-12 MED ORDER — SEMAGLUTIDE-WEIGHT MANAGEMENT 2.4 MG/0.75ML ~~LOC~~ SOAJ: 2.4000 mg | SUBCUTANEOUS | 0 refills | Status: AC

## 2020-08-12 MED ORDER — SEMAGLUTIDE-WEIGHT MANAGEMENT 0.5 MG/0.5ML ~~LOC~~ SOAJ: 0.5000 mg | SUBCUTANEOUS | 0 refills | Status: AC

## 2020-08-12 MED ORDER — SEMAGLUTIDE-WEIGHT MANAGEMENT 1.7 MG/0.75ML ~~LOC~~ SOAJ: 1.7000 mg | SUBCUTANEOUS | 0 refills | Status: AC

## 2020-08-12 MED ORDER — ALPRAZOLAM 0.5 MG PO TABS: ORAL_TABLET | ORAL | 5 refills | Status: DC

## 2020-08-12 NOTE — Progress Notes (Signed)
Chief Complaint  Patient presents with   Follow-up    Subjective Kristina Patel presents for f/u anxiety/depression.  Pt is currently being treated with Zoloft 100 mg daily, Xanax 0.5 mg twice daily as needed.  Reports doing well since treatment. No thoughts of harming self or others. No self-medication with alcohol, prescription drugs or illicit drugs. Pt is not following with a counselor/psychologist.  Past Medical History:  Diagnosis Date   Bacterial overgrowth syndrome    Breast mass    Carpal tunnel syndrome    Colon polyps    Degenerative disc disease    Depression    Fall    GERD (gastroesophageal reflux disease)    Gouty arthropathy    Headache    Hyperlipidemia    Hypertension    IBS (irritable bowel syndrome)    constipation   Internal prolapsed hemorrhoids    Iron deficiency anemia    Melanoma (HCC)    Osteoarthritis    Osteoporosis    Ovarian cancer ([place])    Pelvic floor dysfunction 05/05/2016   Peripheral neuropathy    Prediabetes    Pulmonary nodules    Renal disease    Rheumatoid arthritis(714.0)    Squamous cell carcinoma skin of abdomen    Uterine cancer (HCC)    Vitamin D deficiency    Allergies as of 08/12/2020       Reactions   Butalbital-asa-caff-codeine [fiorinal-codeine] Anaphylaxis, Swelling   [place] Other (See Comments)   Irritable & hyper   Fiorinal [butalbital-aspirin-caffeine] Anaphylaxis   Penciclovir Anaphylaxis   Penicillins Anaphylaxis   Has patient had a PCN reaction causing immediate rash, facial/tongue/throat swelling, SOB or lightheadedness with hypotension: Yes Has patient had a PCN reaction causing severe rash involving mucus membranes or skin necrosis: No Has patient had a PCN reaction that required hospitalization: No Has patient had a PCN reaction occurring within the last 10 years: No If all of the above answers are "NO", then may proceed with Cephalosporin use.   Buprenorphine Rash   Amitriptyline Other (See  Comments)   hyperactivity   Ativan [lorazepam] Other (See Comments)   Adverse reaction...made patient "act out" - out of her mind   Bactrim [sulfamethoxazole-trimethoprim] Diarrhea, Nausea And Vomiting, Other (See Comments)   Severe cramps   Ciprofloxacin Hcl Nausea And Vomiting, Other (See Comments)   hallucinations   Gabapentin    Lisinopril Cough   Tape Other (See Comments)   Allergic to plastic tape. The adhesive causes redness to skin as well as loss of skin. - please use paper tape   Ambien [zolpidem Tartrate] Other (See Comments)   Sleep walking   Benzodiazepines Cough   Metoclopramide Anxiety   Propoxyphene Rash, Other (See Comments)   Irritable and hyperactive   Zolpidem Tartrate    Other reaction(s): Other (See Comments) Sleep walking Other reaction(s): Other (See Comments) Sleep walking        Medication List        Accurate as of August 12, 2020  2:09 PM. If you have any questions, ask your nurse or doctor.          STOP taking these medications    Humira 40 MG/0.4ML Pskt Generic drug: Adalimumab Stopped by: Shelda Pal, DO       TAKE these medications    acetaminophen 325 MG tablet Commonly known as: TYLENOL Take 325 mg by mouth every 6 (six) hours as needed for headache (pain).   ACIDOPHILUS LACTOBACILLUS PO Take 2 tablets by mouth  3 (three) times daily with meals.   ALPRAZolam 0.5 MG tablet Commonly known as: XANAX TAKE 1 TABLET BY MOUTH EVERY 12 HOURS AS NEEDED   ascorbic acid 500 MG tablet Commonly known as: VITAMIN C Take by mouth.   ASPIRIN 81 PO Take 81 mg by mouth daily.   aspirin-acetaminophen-caffeine 250-250-65 MG tablet Commonly known as: EXCEDRIN MIGRAINE Take 1 tablet by mouth daily as needed for headache or migraine.   atorvastatin 10 MG tablet Commonly known as: LIPITOR Take 10 mg by mouth daily.   baclofen 10 MG tablet Commonly known as: LIORESAL Take 1 tablet by mouth 3 (three) times daily.    bupivacaine 0.25 % injection Commonly known as: MARCAINE Inject into the skin.   buPROPion 150 MG 24 hr tablet Commonly known as: Wellbutrin XL Take 1 tablet (150 mg total) by mouth daily.   calcium-vitamin D 500-200 MG-UNIT Tabs tablet Commonly known as: OSCAL WITH D Take 1 tablet by mouth every evening.   Cimzia Prefilled 2 X 200 MG/ML Pskt Generic drug: Certolizumab Pegol Inject into the skin.   cyanocobalamin 1000 MCG tablet Take 1,000 mcg by mouth daily.   cyclobenzaprine 10 MG tablet Commonly known as: FLEXERIL Take by mouth.   cycloSPORINE 0.05 % ophthalmic emulsion Commonly known as: RESTASIS INSTILL 1 DROP IN EACH EYE EVERY 12 HOURS   D2000 Ultra Strength 50 MCG (2000 UT) Caps Generic drug: Cholecalciferol Take by mouth.   denosumab 60 MG/ML Sosy injection Commonly known as: PROLIA Inject 60 mg into the skin every 6 (six) months. Last injection July 2019   diclofenac Sodium 1 % Gel Commonly known as: Voltaren Apply 2 g topically 4 (four) times daily.   diphenhydrAMINE 25 mg capsule Commonly known as: BENADRYL Take by mouth.   esomeprazole 40 MG capsule Commonly known as: NEXIUM Take by mouth.   famotidine 20 MG tablet Commonly known as: PEPCID Take 20 mg by mouth at bedtime.   Flaxseed Oil 1000 MG Caps Take 1,000 mg by mouth daily with supper.   fluorometholone 0.1 % ophthalmic suspension Commonly known as: FML Apply to eye.   glipiZIDE 5 MG tablet Commonly known as: GLUCOTROL Take 2.5 mg by mouth 2 (two) times daily.   hyoscyamine 0.125 MG SL tablet Commonly known as: LEVSIN SL Place under the tongue.   iron sucrose 20 MG/ML injection Commonly known as: VENOFER Inject into the vein.   leflunomide 10 MG tablet Commonly known as: ARAVA Take 1 tablet by mouth daily.   lidocaine 2 % jelly Commonly known as: XYLOCAINE 1 application as needed.   lidocaine 5 % ointment Commonly known as: XYLOCAINE Apply 1 application topically as  needed.   lidocaine 5 % Commonly known as: LIDODERM Place 1 patch onto the skin daily as needed (pain). Remove & Discard patch within 12 hours or as directed by MD   Lifitegrast 5 % Soln Apply to eye.   Magnesium Oxide 420 MG Tabs Take 1 tablet by mouth daily.   methocarbamol 500 MG tablet Commonly known as: ROBAXIN Take 1 tablet (500 mg total) by mouth every 8 (eight) hours as needed for muscle spasms.   methylPREDNISolone sodium succinate 40 mg/mL injection Commonly known as: SOLU-MEDROL Inject into the vein.   metoprolol tartrate 50 MG tablet Commonly known as: LOPRESSOR Take by mouth.   montelukast 10 MG tablet Commonly known as: SINGULAIR Take 1 tablet (10 mg total) by mouth at bedtime.   multivitamin with minerals Tabs tablet Take 1 tablet by  mouth daily with supper.   nystatin powder Commonly known as: MYCOSTATIN/NYSTOP Apply topically.   ondansetron 8 MG tablet Commonly known as: ZOFRAN Take by mouth.   pantoprazole 40 MG tablet Commonly known as: PROTONIX Take 40 mg by mouth daily.   prednisoLONE acetate 1 % ophthalmic suspension Commonly known as: PRED FORTE 1 drop 2 (two) times daily.   sertraline 100 MG tablet Commonly known as: ZOLOFT Take by mouth.   topiramate 25 MG tablet Commonly known as: TOPAMAX Take 2 tablets (50 mg total) by mouth 2 (two) times daily.   triamcinolone acetonide 40 MG/ML Susp Commonly known as: TRIESENCE Inject into the articular space.   TURMERIC PO Take 900 mg by mouth 3 (three) times daily with meals.   venlafaxine XR 75 MG 24 hr capsule Commonly known as: EFFEXOR-XR Take 1 capsule (75 mg total) by mouth daily with breakfast.        Exam BP 140/84   Pulse 67   Temp 98.1 F (36.7 C) (Oral)   Ht 5\' 3"  (1.6 m)   Wt 193 lb 4 oz (87.7 kg)   SpO2 98%   BMI 34.23 kg/m  General:  well developed, well nourished, in no apparent distress Lungs:  No respiratory distress Psych: well oriented with normal range  of affect and age-appropriate judgement/insight, alert and oriented x4.  Assessment and Plan  Major depressive disorder, recurrent, moderate (HCC)  GAD (generalized anxiety disorder)  History of amputation of lesser toe of right foot (HCC)  Hemiparesis of left nondominant side, unspecified hemiparesis etiology ([place])  Malignant neoplasm of ovary, unspecified laterality ([place]), Chronic  Neuropathy due to chemotherapeutic drug ([place]), Chronic  Rheumatoid arthritis involving both hands, unspecified whether rheumatoid factor present ([place]), Chronic  Peripheral vascular disease, unspecified ([place]), Chronic  Most of her medical care is from the [place].  I am refilling her Xanax.  She is doing well overall.  I asked if she wanted to see if her [place] team would fill this so she does not have to come to me every 6 months but she said they will not do that.  While I find that odd, I do not mind filling this for her. F/u in 6 mo for physical or as needed. The patient voiced understanding and agreement to the plan.  [place], DO 08/12/20 2:09 PM

## 2020-08-12 NOTE — Patient Instructions (Signed)
Keep the diet clean and stay active.  Let us know if you need anything. 

## 2020-08-13 ENCOUNTER — Other Ambulatory Visit: Payer: Self-pay | Admitting: Family Medicine

## 2020-08-13 MED ORDER — PLENITY PO CAPS: 1.0000 | ORAL_CAPSULE | Freq: Two times a day (BID) | ORAL | 0 refills | Status: DC

## 2020-08-27 ENCOUNTER — Ambulatory Visit (INDEPENDENT_AMBULATORY_CARE_PROVIDER_SITE_OTHER): Payer: Medicare HMO

## 2020-08-27 VITALS — Ht 63.0 in | Wt 193.0 lb

## 2020-08-27 DIAGNOSIS — Z0000 Encounter for general adult medical examination without abnormal findings: Secondary | ICD-10-CM | POA: Diagnosis not present

## 2020-08-27 NOTE — Progress Notes (Signed)
Subjective:   Kristina Patel is a 75 y.o. female who presents for Medicare Annual (Subsequent) preventive examination.  I connected with Aaliyha today by telephone and verified that I am speaking with the correct person using two identifiers. Location patient: home Location provider: work Persons participating in the virtual visit: patient, Marine scientist.    I discussed the limitations, risks, security and privacy concerns of performing an evaluation and management service by telephone and the availability of in person appointments. I also discussed with the patient that there may be a patient responsible charge related to this service. The patient expressed understanding and verbally consented to this telephonic visit.    Interactive audio and video telecommunications were attempted between this provider and patient, however failed, due to patient having technical difficulties OR patient did not have access to video capability.  We continued and completed visit with audio only.  Some vital signs may be absent or patient reported.   Time Spent with patient on telephone encounter: 20 minutes   Review of Systems     Cardiac Risk Factors include: advanced age (>34men, >65 women);diabetes mellitus;dyslipidemia;hypertension;obesity (BMI >30kg/m2)     Objective:    Today's Vitals   08/27/20 1258 08/27/20 1259  Weight: 193 lb (87.5 kg)   Height: 5\' 3"  (1.6 m)   PainSc:  7    Body mass index is 34.19 kg/m.  Advanced Directives 08/27/2020 05/24/2019 02/26/2019 02/19/2018 01/13/2018 07/01/2015 07/21/2014  Does Patient Have a Medical Advance Directive? Yes No Yes No No Yes Yes  Type of Paramedic of [place];Living will - [place];Living will - - Living will Living will  Does patient want to make changes to medical advance directive? - - No - Patient declined - - - -  Copy of [place] in Chart? No - copy requested - No - copy requested -  - - -  Would patient like information on creating a medical advance directive? - - - - No - Patient declined - -  Pre-existing out of facility DNR order (yellow form or pink MOST form) - - - - - - -  Some encounter information is confidential and restricted. Go to Review Flowsheets activity to see all data.    Current Medications (verified) Outpatient Encounter Medications as of 08/27/2020  Medication Sig   acetaminophen (TYLENOL) 325 MG tablet Take 325 mg by mouth every 6 (six) hours as needed for headache (pain).   ACIDOPHILUS LACTOBACILLUS PO Take 2 tablets by mouth 3 (three) times daily with meals.    ALPRAZolam (XANAX) 0.5 MG tablet TAKE 1 TABLET BY MOUTH EVERY 12 HOURS AS NEEDED   ascorbic acid (VITAMIN C) 500 MG tablet Take by mouth.   ASPIRIN 81 PO Take 81 mg by mouth daily.   aspirin-acetaminophen-caffeine (EXCEDRIN MIGRAINE) 250-250-65 MG tablet Take 1 tablet by mouth daily as needed for headache or migraine.   atorvastatin (LIPITOR) 10 MG tablet Take 10 mg by mouth daily.    baclofen (LIORESAL) 10 MG tablet Take 1 tablet by mouth 3 (three) times daily.   bupivacaine (MARCAINE) 0.25 % injection Inject into the skin.   calcium-vitamin D (OSCAL WITH D) 500-200 MG-UNIT TABS tablet Take 1 tablet by mouth every evening.   Carboxymeth-Cellulose-CitricAc (PLENITY) CAPS Take 1 capsule by mouth 2 (two) times daily before lunch and supper.   Certolizumab Pegol (CIMZIA PREFILLED) 2 X 200 MG/ML PSKT Inject into the skin.   Cholecalciferol (D2000 ULTRA STRENGTH) 50 MCG (  2000 UT) CAPS Take by mouth.   cyanocobalamin 1000 MCG tablet Take 1,000 mcg by mouth daily.   cyclobenzaprine (FLEXERIL) 10 MG tablet Take by mouth.   cycloSPORINE (RESTASIS) 0.05 % ophthalmic emulsion INSTILL 1 DROP IN EACH EYE EVERY 12 HOURS   denosumab (PROLIA) 60 MG/ML SOSY injection Inject 60 mg into the skin every 6 (six) months. Last injection July 2019   diclofenac Sodium (VOLTAREN) 1 % GEL Apply 2 g topically 4 (four)  times daily.   diphenhydrAMINE (BENADRYL) 25 mg capsule Take by mouth.   esomeprazole (NEXIUM) 40 MG capsule Take by mouth.   famotidine (PEPCID) 20 MG tablet Take 20 mg by mouth at bedtime.   Flaxseed, Linseed, (FLAXSEED OIL) 1000 MG CAPS Take 1,000 mg by mouth daily with supper.    fluorometholone (FML) 0.1 % ophthalmic suspension Apply to eye.   glipiZIDE (GLUCOTROL) 5 MG tablet Take 2.5 mg by mouth 2 (two) times daily.   hyoscyamine (LEVSIN SL) 0.125 MG SL tablet Place under the tongue.   iron sucrose (VENOFER) 20 MG/ML injection Inject into the vein.   leflunomide (ARAVA) 10 MG tablet Take 1 tablet by mouth daily.   lidocaine (LIDODERM) 5 % Place 1 patch onto the skin daily as needed (pain). Remove & Discard patch within 12 hours or as directed by MD   lidocaine (XYLOCAINE) 2 % jelly 1 application as needed.   lidocaine (XYLOCAINE) 5 % ointment Apply 1 application topically as needed.   Lifitegrast 5 % SOLN Apply to eye.   Magnesium Oxide 420 MG TABS Take 1 tablet by mouth daily.   methocarbamol (ROBAXIN) 500 MG tablet Take 1 tablet (500 mg total) by mouth every 8 (eight) hours as needed for muscle spasms.   metoprolol tartrate (LOPRESSOR) 50 MG tablet Take by mouth.   montelukast (SINGULAIR) 10 MG tablet Take 1 tablet (10 mg total) by mouth at bedtime.   Multiple Vitamin (MULTIVITAMIN WITH MINERALS) TABS tablet Take 1 tablet by mouth daily with supper.   nystatin (MYCOSTATIN/NYSTOP) powder Apply topically.   ondansetron (ZOFRAN) 8 MG tablet Take by mouth.   pantoprazole (PROTONIX) 40 MG tablet Take 40 mg by mouth daily.   prednisoLONE acetate (PRED FORTE) 1 % ophthalmic suspension 1 drop 2 (two) times daily.   Semaglutide-Weight Management 0.25 MG/0.5ML SOAJ Inject 0.25 mg into the skin once a week for 28 days.   [START ON 09/10/2020] Semaglutide-Weight Management 0.5 MG/0.5ML SOAJ Inject 0.5 mg into the skin once a week for 28 days.   [START ON 10/09/2020] Semaglutide-Weight Management  1 MG/0.5ML SOAJ Inject 1 mg into the skin once a week for 28 days.   [START ON 11/07/2020] Semaglutide-Weight Management 1.7 MG/0.75ML SOAJ Inject 1.7 mg into the skin once a week for 28 days.   [START ON 12/06/2020] Semaglutide-Weight Management 2.4 MG/0.75ML SOAJ Inject 2.4 mg into the skin once a week for 28 days.   sertraline (ZOLOFT) 100 MG tablet Take by mouth.   topiramate (TOPAMAX) 25 MG tablet Take 2 tablets (50 mg total) by mouth 2 (two) times daily.   triamcinolone acetonide (TRIESENCE) 40 MG/ML SUSP Inject into the articular space.   TURMERIC PO Take 900 mg by mouth 3 (three) times daily with meals.   No facility-administered encounter medications on file as of 08/27/2020.    Allergies (verified) Butalbital-asa-caff-codeine [fiorinal-codeine], Darvon, Fiorinal [butalbital-aspirin-caffeine], Penciclovir, Penicillins, Buprenorphine, Amitriptyline, Ativan [lorazepam], Bactrim [sulfamethoxazole-trimethoprim], Ciprofloxacin hcl, Gabapentin, Lisinopril, Tape, Ambien [zolpidem tartrate], Benzodiazepines, Metoclopramide, Propoxyphene, and Zolpidem tartrate  History: Past Medical History:  Diagnosis Date   Bacterial overgrowth syndrome    Breast mass    Carpal tunnel syndrome    Colon polyps    Degenerative disc disease    Depression    Fall    GERD (gastroesophageal reflux disease)    Gouty arthropathy    Headache    Hyperlipidemia    Hypertension    IBS (irritable bowel syndrome)    constipation   Internal prolapsed hemorrhoids    Iron deficiency anemia    Melanoma (HCC)    Osteoarthritis    Osteoporosis    Ovarian cancer ([place])    Pelvic floor dysfunction 05/05/2016   Peripheral neuropathy    Prediabetes    Pulmonary nodules    Renal disease    Rheumatoid arthritis(714.0)    Squamous cell carcinoma skin of abdomen    Uterine cancer ([place])    Vitamin D deficiency    Past Surgical History:  Procedure Laterality Date   ABDOMINAL HYSTERECTOMY     partial    ADENOIDECTOMY     ANAL RECTAL MANOMETRY N/A 04/30/2016   Procedure: ANO RECTAL MANOMETRY;  Surgeon: Mauri Pole, MD;  Location: [facility];  Service: Endoscopy;  Laterality: N/A;   APPENDECTOMY     BREAST SURGERY Bilateral    benign fatty tumor removed   CARPAL TUNNEL RELEASE Right    CERVICAL SPINE SURGERY     CHOLECYSTECTOMY     COLONOSCOPY     KNEE ARTHROPLASTY Right    LUMBAR [place], PARTIAL     MELANOMA EXCISION     OPERATIVE HYSTEROSCOPY     TONSILLECTOMY     Family History  Problem Relation Age of Onset   Alzheimer's disease Mother    Colon cancer Mother 74   Lung cancer Mother    Dementia Mother    Melanoma Father    Non-Hodgkin's lymphoma Father    Heart disease Maternal Grandmother    Colon cancer Maternal Grandfather    Colon cancer Paternal Grandmother    Colon cancer Paternal Grandfather    Social History   Socioeconomic History   Marital status: Married    Spouse name: [name]   Number of children: 2   Years of education: 68   Highest education level: Not on file  Occupational History   Occupation: disabled  Tobacco Use   Smoking status: Never   Smokeless tobacco: Never  Vaping Use   Vaping Use: Never used  Substance and Sexual Activity   Alcohol use: No   Drug use: No   Sexual activity: Not Currently  Other Topics Concern   Not on file  Social History Narrative   Patient is married [name]) and lives with her husband.   Patient has two children.   Patient is retired.   Patient has a college education.   Patient is right handed.   Patient drinks 3-4 cups of coffee and tea daily.   [employer]   Social Determinants of Radio broadcast assistant Strain: Low Risk    Difficulty of Paying Living Expenses: Not hard at all  Food Insecurity: No Food Insecurity   Worried About Charity fundraiser in the Last Year: Never true   Arboriculturist in the Last Year: Never true  Transportation Needs: No Transportation Needs    Lack of Transportation (Medical): No   Lack of Transportation (Non-Medical): No  Physical Activity: Insufficiently Active   Days of  Exercise per Week: 2 days   Minutes of Exercise per Session: 40 min  Stress: No Stress Concern Present   Feeling of Stress : Not at all  Social Connections: Moderately Isolated   Frequency of Communication with Friends and Family: More than three times a week   Frequency of Social Gatherings with Friends and Family: More than three times a week   Attends Religious Services: Never   Marine scientist or Organizations: No   Attends Music therapist: Never   Marital Status: Married    Tobacco Counseling Counseling given: Not Answered   Clinical Intake:  Pre-visit preparation completed: Yes  Pain : 0-10 Pain Score: 7  Pain Type: Chronic pain Pain Location: Generalized (Rheumatoid Arthritis) Pain Onset: More than a month ago Pain Frequency: Constant     Nutritional Status: BMI > 30  Obese Nutritional Risks: None Diabetes: Yes CBG done?: No Did pt. bring in CBG monitor from home?: No (phone visit)  How often do you need to have someone help you when you read instructions, pamphlets, or other written materials from your doctor or pharmacy?: 1 - Never  Diabetes:  Is the patient diabetic?  Yes  If diabetic, was a CBG obtained today?  No  Did the patient bring in their glucometer from home?  No phone visit How often do you monitor your CBG's? weekly.   Financial Strains and Diabetes Management:  Are you having any financial strains with the device, your supplies or your medication? No .  Does the patient want to be seen by Chronic Care Management for management of their diabetes?  No  Would the patient like to be referred to a Nutritionist or for Diabetic Management?  No   Diabetic Exams:  Diabetic Eye Exam: Completed 07/2020. Awaiting notes from Dr. Kathlen Mody  Diabetic Foot Exam: Completed 07/04/2020.   Interpreter Needed?:  No  Information entered by :: Caroleen Hamman LPN   Activities of Daily Living In your present state of health, do you have any difficulty performing the following activities: 08/27/2020 08/12/2020  Hearing? N N  Vision? N N  Difficulty concentrating or making decisions? N N  Walking or climbing stairs? N N  Dressing or bathing? N N  Doing errands, shopping? N N  Preparing Food and eating ? N -  Using the Toilet? N -  In the past six months, have you accidently leaked urine? N -  Do you have problems with loss of bowel control? N -  Managing your Medications? N -  Managing your Finances? N -  Housekeeping or managing your Housekeeping? N -  Some recent data might be hidden    Patient Care Team: Shelda Pal, DO as PCP - General (Family Medicine)  Indicate any recent Medical Services you may have received from other than [facility] providers in the past year (date may be approximate).     Assessment:   This is a routine wellness examination for [place].  Hearing/Vision screen Hearing Screening - Comments:: No hearing loss Vision Screening - Comments:: Last eye exam-07/2020-Dr. Kathlen Mody  Dietary issues and exercise activities discussed: Current Exercise Habits: Home exercise routine, Type of exercise: walking, Time (Minutes): 45, Frequency (Times/Week): 2, Weekly Exercise (Minutes/Week): 90, Intensity: Mild, Exercise limited by: orthopedic condition(s) (Rheumatoid Arthritis)   Goals Addressed             This Visit's Progress    Patient Stated   On track    Maintain healthy active life  Depression Screen PHQ 2/9 Scores 08/27/2020 08/12/2020 08/12/2020 02/26/2019 01/23/2016 12/03/2014  PHQ - 2 Score 0 0 0 1 2 3   PHQ- 9 Score - 0 - - 8 9    Fall Risk Fall Risk  08/27/2020 08/12/2020 02/26/2019 01/23/2016 12/03/2014  Falls in the past year? 0 0 1 Yes No  Number falls in past yr: 0 0 1 1 -  Injury with Fall? 0 0 0 No -  Risk for fall due to : - No Fall Risks - - -   Follow up Falls prevention discussed Falls evaluation completed Education provided;Falls prevention discussed - -    FALL RISK PREVENTION PERTAINING TO THE HOME:  Any stairs in or around the home? No  Home free of loose throw rugs in walkways, pet beds, electrical cords, etc? Yes  Adequate lighting in your home to reduce risk of falls? Yes   ASSISTIVE DEVICES UTILIZED TO PREVENT FALLS:  Life alert? No  Use of a cane, walker or w/c? No  Grab bars in the bathroom? Yes  Shower chair or bench in shower? No  Elevated toilet seat or a handicapped toilet? No   TIMED UP AND GO:  Was the test performed? No . Phone visit   Cognitive Function:Normal cognitive status assessed by  this Nurse Health Advisor. No abnormalities found.          Immunizations Immunization History  Administered Date(s) Administered   Fluad Quad(high Dose 65+) 11/23/2014, 11/23/2018, 12/10/2019   Influenza, High Dose Seasonal PF 01/23/2016   Moderna Sars-Covid-2 Vaccination 04/07/2019, 05/05/2019, 11/05/2019, 06/03/2020   Pneumococcal Conjugate-13 09/19/2013   Pneumococcal Polysaccharide-23 05/21/2014, 12/08/2018   Pneumococcal-Unspecified 02/22/2013   Tdap 07/07/2011   Tetanus 02/23/2012    TDAP status: Up to date  Flu Vaccine status: Up to date  Pneumococcal vaccine status: Up to date  Covid-19 vaccine status: Completed vaccines  Qualifies for Shingles Vaccine? Yes   Zostavax completed No   Shingrix Completed?: No.    Education has been provided regarding the importance of this vaccine. Patient has been advised to call insurance company to determine out of pocket expense if they have not yet received this vaccine. Advised may also receive vaccine at local pharmacy or Health Dept. Verbalized acceptance and understanding.  Screening Tests Health Maintenance  Topic Date Due   OPHTHALMOLOGY EXAM  Never done   URINE MICROALBUMIN  Never done   Zoster Vaccines- Shingrix (1 of 2) Never done    HEMOGLOBIN A1C  10/31/2016   INFLUENZA VACCINE  09/22/2020   COVID-19 Vaccine (5 - Booster for Moderna series) 10/03/2020   MAMMOGRAM  10/25/2020   FOOT EXAM  07/04/2021   TETANUS/TDAP  02/22/2022   COLONOSCOPY (Pts 45-71yrs Insurance coverage will need to be confirmed)  07/07/2023   DEXA SCAN  Completed   Hepatitis C Screening  Completed   HPV VACCINES  Aged Out   PNA vac Low Risk Adult  Discontinued    Health Maintenance  Health Maintenance Due  Topic Date Due   OPHTHALMOLOGY EXAM  Never done   URINE MICROALBUMIN  Never done   Zoster Vaccines- Shingrix (1 of 2) Never done   HEMOGLOBIN A1C  10/31/2016    Colorectal cancer screening: Type of screening: Colonoscopy. Completed 07/06/2013. Repeat every 10 years  Mammogram status: Completed 02/2020-per patient at [facility] clinic. Repeat every year Requested copy of results be sent to PCP.  Bone Density status: Patient states -up to date-Date unknown. Completed at [facility]  Lung Cancer Screening: (  Low Dose CT Chest recommended if Age 15-80 years, 30 pack-year currently smoking OR have quit w/in 15years.) does not qualify.     Additional Screening:  Hepatitis C Screening:  Completed 02/22/2002  Vision Screening: Recommended annual ophthalmology exams for early detection of glaucoma and other disorders of the eye. Is the patient up to date with their annual eye exam?  Yes  Who is the provider or what is the name of the office in which the patient attends annual eye exams? Dr. Kathlen Mody   Dental Screening: Recommended annual dental exams for proper oral hygiene  Community Resource Referral / Chronic Care Management: CRR required this visit?  No   CCM required this visit?  No      Plan:     I have personally reviewed and noted the following in the patient's chart:   Medical and social history Use of alcohol, tobacco or illicit drugs  Current medications and supplements including opioid prescriptions.  Functional ability and  status Nutritional status Physical activity Advanced directives List of other physicians Hospitalizations, surgeries, and ER visits in previous 12 months Vitals Screenings to include cognitive, depression, and falls Referrals and appointments  In addition, I have reviewed and discussed with patient certain preventive protocols, quality metrics, and best practice recommendations. A written personalized care plan for preventive services as well as general preventive health recommendations were provided to patient.   Due to this being a telephonic visit, the after visit summary with patients personalized plan was offered to patient via mail or my-chart. Patient would like to access on my-chart.    Marta Antu, LPN   09/30/3808  Nurse Health Advisor  Nurse Notes: None

## 2020-08-27 NOTE — Patient Instructions (Signed)
Kristina Patel , Thank you for taking time to complete your Medicare Wellness Visit. I appreciate your ongoing commitment to your health goals. Please review the following plan we discussed and let me know if I can assist you in the future.   Screening recommendations/referrals: Colonoscopy: Per our conversation,Completed 2021-Due 2026. Please have copy of report sent to PCP Mammogram:  Per our conversation,Completed 02/2020 Please have copy of report sent to PCP Bone Density:  Per our conversation,Up to date. Please have copy of report sent to PCP Recommended yearly ophthalmology/optometry visit for glaucoma screening and checkup Recommended yearly dental visit for hygiene and checkup  Vaccinations: Influenza vaccine: Up to date Pneumococcal vaccine: Up to date Tdap vaccine: Up to date-Due-02/22/2022 Shingles vaccine: Discuss with pharmacy   Covid-19:Up to date  Advanced directives: Please bring a copy for your chart  Conditions/risks identified: See problem list  Next appointment: Follow up in one year for your annual wellness visit 09/02/2021 @ 1:00   Preventive Care 74 Years and Older, Female Preventive care refers to lifestyle choices and visits with your health care provider that can promote health and wellness. What does preventive care include? A yearly physical exam. This is also called an annual well check. Dental exams once or twice a year. Routine eye exams. Ask your health care provider how often you should have your eyes checked. Personal lifestyle choices, including: Daily care of your teeth and gums. Regular physical activity. Eating a healthy diet. Avoiding tobacco and drug use. Limiting alcohol use. Practicing safe sex. Taking low-dose aspirin every day. Taking vitamin and mineral supplements as recommended by your health care provider. What happens during an annual well check? The services and screenings done by your health care provider during your annual well  check will depend on your age, overall health, lifestyle risk factors, and family history of disease. Counseling  Your health care provider may ask you questions about your: Alcohol use. Tobacco use. Drug use. Emotional well-being. Home and relationship well-being. Sexual activity. Eating habits. History of falls. Memory and ability to understand (cognition). Work and work Statistician. Reproductive health. Screening  You may have the following tests or measurements: Height, weight, and BMI. Blood pressure. Lipid and cholesterol levels. These may be checked every 5 years, or more frequently if you are over 74 years old. Skin check. Lung cancer screening. You may have this screening every year starting at age 74 if you have a 30-pack-year history of smoking and currently smoke or have quit within the past 15 years. Fecal occult blood test (FOBT) of the stool. You may have this test every year starting at age 74. Flexible sigmoidoscopy or colonoscopy. You may have a sigmoidoscopy every 5 years or a colonoscopy every 10 years starting at age 74. Hepatitis C blood test. Hepatitis B blood test. Sexually transmitted disease (STD) testing. Diabetes screening. This is done by checking your blood sugar (glucose) after you have not eaten for a while (fasting). You may have this done every 1-3 years. Bone density scan. This is done to screen for osteoporosis. You may have this done starting at age 74. Mammogram. This may be done every 1-2 years. Talk to your health care provider about how often you should have regular mammograms. Talk with your health care provider about your test results, treatment options, and if necessary, the need for more tests. Vaccines  Your health care provider may recommend certain vaccines, such as: Influenza vaccine. This is recommended every year. Tetanus, diphtheria, and acellular pertussis (  Tdap, Td) vaccine. You may need a Td booster every 10 years. Zoster  vaccine. You may need this after age 74. Pneumococcal 13-valent conjugate (PCV13) vaccine. One dose is recommended after age 74. Pneumococcal polysaccharide (PPSV23) vaccine. One dose is recommended after age 74. Talk to your health care provider about which screenings and vaccines you need and how often you need them. This information is not intended to replace advice given to you by your health care provider. Make sure you discuss any questions you have with your health care provider. Document Released: 03/07/2015 Document Revised: 10/29/2015 Document Reviewed: 12/10/2014 Elsevier Interactive Patient Education  2017 [employer] Prevention in the Home Falls can cause injuries. They can happen to people of all ages. There are many things you can do to make your home safe and to help prevent falls. What can I do on the outside of my home? Regularly fix the edges of walkways and driveways and fix any cracks. Remove anything that might make you trip as you walk through a door, such as a raised step or threshold. Trim any bushes or trees on the path to your home. Use bright outdoor lighting. Clear any walking paths of anything that might make someone trip, such as rocks or tools. Regularly check to see if handrails are loose or broken. Make sure that both sides of any steps have handrails. Any raised decks and porches should have guardrails on the edges. Have any leaves, snow, or ice cleared regularly. Use sand or salt on walking paths during winter. Clean up any spills in your garage right away. This includes oil or grease spills. What can I do in the bathroom? Use night lights. Install grab bars by the toilet and in the tub and shower. Do not use towel bars as grab bars. Use non-skid mats or decals in the tub or shower. If you need to sit down in the shower, use a plastic, non-slip stool. Keep the floor dry. Clean up any water that spills on the floor as soon as it happens. Remove  soap buildup in the tub or shower regularly. Attach bath mats securely with double-sided non-slip rug tape. Do not have throw rugs and other things on the floor that can make you trip. What can I do in the bedroom? Use night lights. Make sure that you have a light by your bed that is easy to reach. Do not use any sheets or blankets that are too big for your bed. They should not hang down onto the floor. Have a firm chair that has side arms. You can use this for support while you get dressed. Do not have throw rugs and other things on the floor that can make you trip. What can I do in the kitchen? Clean up any spills right away. Avoid walking on wet floors. Keep items that you use a lot in easy-to-reach places. If you need to reach something above you, use a strong step stool that has a grab bar. Keep electrical cords out of the way. Do not use floor polish or wax that makes floors slippery. If you must use wax, use non-skid floor wax. Do not have throw rugs and other things on the floor that can make you trip. What can I do with my stairs? Do not leave any items on the stairs. Make sure that there are handrails on both sides of the stairs and use them. Fix handrails that are broken or loose. Make sure that handrails are  as long as the stairways. Check any carpeting to make sure that it is firmly attached to the stairs. Fix any carpet that is loose or worn. Avoid having throw rugs at the top or bottom of the stairs. If you do have throw rugs, attach them to the floor with carpet tape. Make sure that you have a light switch at the top of the stairs and the bottom of the stairs. If you do not have them, ask someone to add them for you. What else can I do to help prevent falls? Wear shoes that: Do not have high heels. Have rubber bottoms. Are comfortable and fit you well. Are closed at the toe. Do not wear sandals. If you use a stepladder: Make sure that it is fully opened. Do not climb a  closed stepladder. Make sure that both sides of the stepladder are locked into place. Ask someone to hold it for you, if possible. Clearly mark and make sure that you can see: Any grab bars or handrails. First and last steps. Where the edge of each step is. Use tools that help you move around (mobility aids) if they are needed. These include: Canes. Walkers. Scooters. Crutches. Turn on the lights when you go into a dark area. Replace any light bulbs as soon as they burn out. Set up your furniture so you have a clear path. Avoid moving your furniture around. If any of your floors are uneven, fix them. If there are any pets around you, be aware of where they are. Review your medicines with your doctor. Some medicines can make you feel dizzy. This can increase your chance of falling. Ask your doctor what other things that you can do to help prevent falls. This information is not intended to replace advice given to you by your health care provider. Make sure you discuss any questions you have with your health care provider. Document Released: 12/05/2008 Document Revised: 07/17/2015 Document Reviewed: 03/15/2014 Elsevier Interactive Patient Education  2017 [employer].

## 2020-09-05 ENCOUNTER — Ambulatory Visit: Payer: Medicare HMO | Admitting: Podiatry

## 2020-09-08 DIAGNOSIS — Z888 Allergy status to other drugs, medicaments and biological substances status: Secondary | ICD-10-CM | POA: Diagnosis not present

## 2020-09-08 DIAGNOSIS — Z79899 Other long term (current) drug therapy: Secondary | ICD-10-CM | POA: Diagnosis not present

## 2020-09-08 DIAGNOSIS — M5116 Intervertebral disc disorders with radiculopathy, lumbar region: Secondary | ICD-10-CM | POA: Diagnosis not present

## 2020-09-08 DIAGNOSIS — M48061 Spinal stenosis, lumbar region without neurogenic claudication: Secondary | ICD-10-CM | POA: Diagnosis not present

## 2020-09-08 DIAGNOSIS — Z880 Allergy status to penicillin: Secondary | ICD-10-CM | POA: Diagnosis not present

## 2020-09-08 DIAGNOSIS — M5416 Radiculopathy, lumbar region: Secondary | ICD-10-CM | POA: Diagnosis not present

## 2020-09-08 DIAGNOSIS — M5136 Other intervertebral disc degeneration, lumbar region: Secondary | ICD-10-CM | POA: Diagnosis not present

## 2020-09-08 DIAGNOSIS — Z885 Allergy status to narcotic agent status: Secondary | ICD-10-CM | POA: Diagnosis not present

## 2020-09-12 ENCOUNTER — Ambulatory Visit: Payer: Medicare HMO | Admitting: Podiatry

## 2020-10-30 DIAGNOSIS — M7062 Trochanteric bursitis, left hip: Secondary | ICD-10-CM | POA: Diagnosis not present

## 2020-10-30 DIAGNOSIS — M19012 Primary osteoarthritis, left shoulder: Secondary | ICD-10-CM | POA: Diagnosis not present

## 2020-11-25 DIAGNOSIS — M25612 Stiffness of left shoulder, not elsewhere classified: Secondary | ICD-10-CM | POA: Diagnosis not present

## 2020-11-25 DIAGNOSIS — M6281 Muscle weakness (generalized): Secondary | ICD-10-CM | POA: Diagnosis not present

## 2020-11-25 DIAGNOSIS — M19012 Primary osteoarthritis, left shoulder: Secondary | ICD-10-CM | POA: Diagnosis not present

## 2020-11-25 DIAGNOSIS — M25512 Pain in left shoulder: Secondary | ICD-10-CM | POA: Diagnosis not present

## 2020-11-27 DIAGNOSIS — M19012 Primary osteoarthritis, left shoulder: Secondary | ICD-10-CM | POA: Diagnosis not present

## 2020-11-27 DIAGNOSIS — M6281 Muscle weakness (generalized): Secondary | ICD-10-CM | POA: Diagnosis not present

## 2020-11-27 DIAGNOSIS — M25612 Stiffness of left shoulder, not elsewhere classified: Secondary | ICD-10-CM | POA: Diagnosis not present

## 2020-11-27 DIAGNOSIS — M25512 Pain in left shoulder: Secondary | ICD-10-CM | POA: Diagnosis not present

## 2020-12-02 ENCOUNTER — Ambulatory Visit: Payer: Medicare HMO | Admitting: Podiatry

## 2020-12-02 DIAGNOSIS — M25512 Pain in left shoulder: Secondary | ICD-10-CM | POA: Diagnosis not present

## 2020-12-02 DIAGNOSIS — M25612 Stiffness of left shoulder, not elsewhere classified: Secondary | ICD-10-CM | POA: Diagnosis not present

## 2020-12-02 DIAGNOSIS — M19012 Primary osteoarthritis, left shoulder: Secondary | ICD-10-CM | POA: Diagnosis not present

## 2020-12-02 DIAGNOSIS — M6281 Muscle weakness (generalized): Secondary | ICD-10-CM | POA: Diagnosis not present

## 2020-12-04 ENCOUNTER — Ambulatory Visit (INDEPENDENT_AMBULATORY_CARE_PROVIDER_SITE_OTHER): Payer: Medicare HMO

## 2020-12-04 ENCOUNTER — Ambulatory Visit: Payer: Medicare HMO | Admitting: Podiatry

## 2020-12-04 ENCOUNTER — Other Ambulatory Visit: Payer: Self-pay

## 2020-12-04 DIAGNOSIS — M79674 Pain in right toe(s): Secondary | ICD-10-CM

## 2020-12-04 DIAGNOSIS — L84 Corns and callosities: Secondary | ICD-10-CM | POA: Diagnosis not present

## 2020-12-04 DIAGNOSIS — E1149 Type 2 diabetes mellitus with other diabetic neurological complication: Secondary | ICD-10-CM | POA: Diagnosis not present

## 2020-12-04 DIAGNOSIS — M7989 Other specified soft tissue disorders: Secondary | ICD-10-CM

## 2020-12-04 DIAGNOSIS — M79671 Pain in right foot: Secondary | ICD-10-CM

## 2020-12-04 DIAGNOSIS — M25612 Stiffness of left shoulder, not elsewhere classified: Secondary | ICD-10-CM | POA: Diagnosis not present

## 2020-12-04 DIAGNOSIS — W57XXXA Bitten or stung by nonvenomous insect and other nonvenomous arthropods, initial encounter: Secondary | ICD-10-CM | POA: Diagnosis not present

## 2020-12-04 DIAGNOSIS — M79675 Pain in left toe(s): Secondary | ICD-10-CM

## 2020-12-04 DIAGNOSIS — M6281 Muscle weakness (generalized): Secondary | ICD-10-CM | POA: Diagnosis not present

## 2020-12-04 DIAGNOSIS — B351 Tinea unguium: Secondary | ICD-10-CM

## 2020-12-04 DIAGNOSIS — M25512 Pain in left shoulder: Secondary | ICD-10-CM | POA: Diagnosis not present

## 2020-12-04 DIAGNOSIS — M19012 Primary osteoarthritis, left shoulder: Secondary | ICD-10-CM | POA: Diagnosis not present

## 2020-12-04 MED ORDER — TRIAMCINOLONE ACETONIDE 0.025 % EX OINT: 1.0000 "application " | TOPICAL_OINTMENT | Freq: Two times a day (BID) | CUTANEOUS | 0 refills | Status: DC

## 2020-12-07 NOTE — Progress Notes (Signed)
Subjective: 75 year old female presents the office today for concerns of thick, elongated, painful nails that she cannot trim himself as well as for calluses.  She also states that there is a "blister" on the top of her right foot which itches and burns.  No injury that she reports and she does not recall getting bit by any type of bug.  She said no recent treatment.  No open lesions.  No fevers, chills, nausea, vomiting.  No calf pain, chest pain, shortness of breath.    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 Sensation decreased with Derrel Nip monofilament Nails are hypertrophic, dystrophic, discolored x9.   Hyperkeratotic lesions bilateral submetatarsal 1 and right submetatarsal 2.  Prominence of the first metatarsal heads plantarly with atrophy of the fat pad. On the dorsal aspect the right foot on the left fourth MPJ area there is 1 small localized area of edema.  There is tenderness directly on this area.  There is no other areas of tenderness.  There is no blister formation but is slightly raised. Hammertoes present.  Bunions present.   No edema, erythema or signs of infection. No pain with calf compression, swelling, warmth, erythema  Assessment: 75 year old female seen for onychomycosis, hyperkeratotic lesions  Plan: -All treatment options discussed with the patient including all alternatives, risks, complications.  -X-rays obtained and reviewed to the right foot.  No evidence of acute fracture, stress fracture, foreign body. -There is not a blister on the right foot but appears almost appears to be a bug bite.  No signs of infection.  Itching.  We will do triamcinolone cream.  She can alternate this with antibiotic ointment as needed as well.  Monitor for any signs or symptoms of infection.  If not improving next 1 to 2 weeks to let me know. -Nails sharply debrided x9  without any complications or bleeding. -Hyperkeratotic lesion sharply today x2 without any complications or bleeding -Discussed daily foot inspection. -Monitor for any clinical signs or symptoms of infection and directed to call the office immediately should any occur or go to the ER.  RTC 3 months or sooner if needed  [name] DPM

## 2020-12-11 DIAGNOSIS — M25612 Stiffness of left shoulder, not elsewhere classified: Secondary | ICD-10-CM | POA: Diagnosis not present

## 2020-12-11 DIAGNOSIS — M6281 Muscle weakness (generalized): Secondary | ICD-10-CM | POA: Diagnosis not present

## 2020-12-11 DIAGNOSIS — M25512 Pain in left shoulder: Secondary | ICD-10-CM | POA: Diagnosis not present

## 2020-12-11 DIAGNOSIS — M19012 Primary osteoarthritis, left shoulder: Secondary | ICD-10-CM | POA: Diagnosis not present

## 2020-12-18 DIAGNOSIS — M6281 Muscle weakness (generalized): Secondary | ICD-10-CM | POA: Diagnosis not present

## 2020-12-18 DIAGNOSIS — M19012 Primary osteoarthritis, left shoulder: Secondary | ICD-10-CM | POA: Diagnosis not present

## 2020-12-18 DIAGNOSIS — M25612 Stiffness of left shoulder, not elsewhere classified: Secondary | ICD-10-CM | POA: Diagnosis not present

## 2020-12-18 DIAGNOSIS — M25512 Pain in left shoulder: Secondary | ICD-10-CM | POA: Diagnosis not present

## 2020-12-23 DIAGNOSIS — M6281 Muscle weakness (generalized): Secondary | ICD-10-CM | POA: Diagnosis not present

## 2020-12-23 DIAGNOSIS — M25512 Pain in left shoulder: Secondary | ICD-10-CM | POA: Diagnosis not present

## 2020-12-23 DIAGNOSIS — M25612 Stiffness of left shoulder, not elsewhere classified: Secondary | ICD-10-CM | POA: Diagnosis not present

## 2020-12-23 DIAGNOSIS — M19012 Primary osteoarthritis, left shoulder: Secondary | ICD-10-CM | POA: Diagnosis not present

## 2020-12-25 DIAGNOSIS — M25512 Pain in left shoulder: Secondary | ICD-10-CM | POA: Diagnosis not present

## 2020-12-25 DIAGNOSIS — M6281 Muscle weakness (generalized): Secondary | ICD-10-CM | POA: Diagnosis not present

## 2020-12-25 DIAGNOSIS — M25612 Stiffness of left shoulder, not elsewhere classified: Secondary | ICD-10-CM | POA: Diagnosis not present

## 2020-12-25 DIAGNOSIS — M19012 Primary osteoarthritis, left shoulder: Secondary | ICD-10-CM | POA: Diagnosis not present

## 2020-12-29 DIAGNOSIS — M6281 Muscle weakness (generalized): Secondary | ICD-10-CM | POA: Diagnosis not present

## 2020-12-29 DIAGNOSIS — M19012 Primary osteoarthritis, left shoulder: Secondary | ICD-10-CM | POA: Diagnosis not present

## 2020-12-29 DIAGNOSIS — M25612 Stiffness of left shoulder, not elsewhere classified: Secondary | ICD-10-CM | POA: Diagnosis not present

## 2020-12-29 DIAGNOSIS — M25512 Pain in left shoulder: Secondary | ICD-10-CM | POA: Diagnosis not present

## 2021-01-01 DIAGNOSIS — M19012 Primary osteoarthritis, left shoulder: Secondary | ICD-10-CM | POA: Diagnosis not present

## 2021-01-01 DIAGNOSIS — M25512 Pain in left shoulder: Secondary | ICD-10-CM | POA: Diagnosis not present

## 2021-01-01 DIAGNOSIS — M25612 Stiffness of left shoulder, not elsewhere classified: Secondary | ICD-10-CM | POA: Diagnosis not present

## 2021-01-01 DIAGNOSIS — M6281 Muscle weakness (generalized): Secondary | ICD-10-CM | POA: Diagnosis not present

## 2021-01-08 DIAGNOSIS — M6281 Muscle weakness (generalized): Secondary | ICD-10-CM | POA: Diagnosis not present

## 2021-01-08 DIAGNOSIS — M19012 Primary osteoarthritis, left shoulder: Secondary | ICD-10-CM | POA: Diagnosis not present

## 2021-01-08 DIAGNOSIS — M25512 Pain in left shoulder: Secondary | ICD-10-CM | POA: Diagnosis not present

## 2021-01-08 DIAGNOSIS — M25612 Stiffness of left shoulder, not elsewhere classified: Secondary | ICD-10-CM | POA: Diagnosis not present

## 2021-01-26 ENCOUNTER — Encounter: Payer: Self-pay | Admitting: Podiatry

## 2021-01-26 ENCOUNTER — Ambulatory Visit: Payer: Medicare HMO | Admitting: Podiatry

## 2021-01-26 ENCOUNTER — Other Ambulatory Visit: Payer: Self-pay

## 2021-01-26 DIAGNOSIS — M7751 Other enthesopathy of right foot: Secondary | ICD-10-CM | POA: Diagnosis not present

## 2021-01-26 MED ORDER — TRIAMCINOLONE ACETONIDE 10 MG/ML IJ SUSP
10.0000 mg | Freq: Once | INTRAMUSCULAR | Status: AC
  Administered 2021-01-26: 10 mg

## 2021-01-27 NOTE — Progress Notes (Signed)
Subjective:   Patient ID: Kristina Patel, female   DOB: 75 y.o.   MRN: [id number]   HPI Patient states she has developed further inflammation around the right foot and the topical medicine used prior has not been successful.  States it feels deeper and more around the joint surface neuro   ROS      Objective:  Physical Exam  Vascular status intact with patient noted to have some inflammation around the fourth MPJ right localized with some slight skin discoloration but no breakdown of tissue no other pathology      Assessment:  Appears to be inflammatory cannot rule out fungal or other skin manifestation     Plan:  H&P today I did a careful periarticular injection around the fourth MPJ 3 mg dexamethasone Kenalog and I discussed possibility for fungal therapy if the skin remains a problem for her.  Hopefully she will respond to what I did today and this will be the end of the issue but if any problems were to occur she is to let us know

## 2021-01-30 DIAGNOSIS — R69 Illness, unspecified: Secondary | ICD-10-CM | POA: Diagnosis not present

## 2021-02-11 ENCOUNTER — Encounter: Payer: Medicare HMO | Admitting: Family Medicine

## 2021-02-11 ENCOUNTER — Other Ambulatory Visit: Payer: Self-pay | Admitting: Family Medicine

## 2021-02-11 NOTE — Telephone Encounter (Signed)
Called informed the patient of PCP instructions. She is having Dental work done currently///once completed she will schedule a followup

## 2021-02-11 NOTE — Telephone Encounter (Signed)
1 mo sent, make sure she reschedules her f/u when convenient. Ty.

## 2021-02-11 NOTE — Telephone Encounter (Signed)
Requesting: alprazolam 0.5mg   Contract: None UDS: 05/08/2019 Last Visit: 08/12/2020 Next Visit: None Last Refill: 08/12/2020 #60 and 5RF  Please Advise

## 2021-03-16 ENCOUNTER — Encounter: Payer: Self-pay | Admitting: Family Medicine

## 2021-03-17 ENCOUNTER — Ambulatory Visit: Payer: Medicare HMO | Admitting: Family Medicine

## 2021-03-24 ENCOUNTER — Ambulatory Visit: Payer: Medicare HMO | Admitting: Podiatry

## 2021-03-27 DIAGNOSIS — Z9049 Acquired absence of other specified parts of digestive tract: Secondary | ICD-10-CM | POA: Diagnosis not present

## 2021-03-27 DIAGNOSIS — Z853 Personal history of malignant neoplasm of breast: Secondary | ICD-10-CM | POA: Diagnosis not present

## 2021-03-27 DIAGNOSIS — Z8543 Personal history of malignant neoplasm of ovary: Secondary | ICD-10-CM | POA: Diagnosis not present

## 2021-03-31 ENCOUNTER — Other Ambulatory Visit: Payer: Self-pay

## 2021-03-31 ENCOUNTER — Ambulatory Visit (INDEPENDENT_AMBULATORY_CARE_PROVIDER_SITE_OTHER): Payer: Medicare HMO

## 2021-03-31 ENCOUNTER — Ambulatory Visit: Payer: Medicare HMO | Admitting: Podiatry

## 2021-03-31 DIAGNOSIS — L84 Corns and callosities: Secondary | ICD-10-CM

## 2021-03-31 DIAGNOSIS — M79675 Pain in left toe(s): Secondary | ICD-10-CM

## 2021-03-31 DIAGNOSIS — M79674 Pain in right toe(s): Secondary | ICD-10-CM | POA: Diagnosis not present

## 2021-03-31 DIAGNOSIS — E1149 Type 2 diabetes mellitus with other diabetic neurological complication: Secondary | ICD-10-CM | POA: Diagnosis not present

## 2021-03-31 DIAGNOSIS — M84374A Stress fracture, right foot, initial encounter for fracture: Secondary | ICD-10-CM | POA: Diagnosis not present

## 2021-03-31 DIAGNOSIS — B351 Tinea unguium: Secondary | ICD-10-CM

## 2021-03-31 DIAGNOSIS — M7989 Other specified soft tissue disorders: Secondary | ICD-10-CM

## 2021-03-31 DIAGNOSIS — M25474 Effusion, right foot: Secondary | ICD-10-CM | POA: Diagnosis not present

## 2021-03-31 DIAGNOSIS — L309 Dermatitis, unspecified: Secondary | ICD-10-CM | POA: Diagnosis not present

## 2021-03-31 MED ORDER — TRIAMCINOLONE ACETONIDE 0.1 % EX CREA: 1.0000 "application " | TOPICAL_CREAM | Freq: Two times a day (BID) | CUTANEOUS | 0 refills | Status: DC

## 2021-04-04 NOTE — Progress Notes (Signed)
Subjective: 76 year old female presents the office today for concerns of right foot pain which is been ongoing for the last several months.  She was last seen in the office by Dr. Paulla Dolly on January 26, 2021 an injection was performed but she states it was not helpful.  She is continue get discomfort as well as swelling point in the dorsal lateral aspect of right foot.  She denies any recent injury.  Also asking for the nails and calluses to be trimmed today as are causing discomfort.  She denies any open sores.  No other concerns today.  Objective: AAO x3, NAD-husband present today. DP/PT pulses palpable bilaterally, CRT less than 3 seconds Tenderness palpation directly on the dorsal aspect of the fourth and fifth metatarsal there is localized edema present at this area.  Also has areas of dry, scaly, erythematous rash consistent with a dermatitis.  There is no hyperpigmentation.  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 Nails appear to be hypertrophic, dystrophic with yellow-brown discoloration to nails 1 through 5 bilaterally except at the right fourth toe which is been partially amputated. Hyperkeratotic lesions noted bilateral submetatarsal 1 and submetatarsal 2 on the right without any underlying ulceration drainage or signs of infection. No pain with calf compression, swelling, warmth, erythema  Assessment: Right foot pain, concern for stress fracture; dermatitis; symptomatic onychomycosis, hyperkeratotic lesions  Plan: -All treatment options discussed with the patient including all alternatives, risks, complications.  -X-rays obtained reviewed.  No definitive evidence of acute fracture noted today. -Given continuation of pain recommend immobilization in surgical shoe which was dispensed today.  Compression, elevation. -Triamcinolone cream ordered for the mild dermatitis dorsal foot which I think is coming from the swelling.  She was asked not  melanoma but I do not see any evidence of this today.  If symptoms continue we will do a skin biopsy. -Sharply debrided nails x9 without complications or bleeding -Sharply debrided hyperkeratotic lesions x3 without any complications or bleeding  RTC 3-4 weeks or sooner if needed.   Kristina Patel DPM  -Patient encouraged to call the office with any questions, concerns, change in symptoms.

## 2021-04-20 ENCOUNTER — Telehealth: Payer: Self-pay | Admitting: Genetic Counselor

## 2021-04-20 NOTE — Telephone Encounter (Signed)
Scheduled appt per 2/27 referral. Pt is aware of appt date and time. Pt is aware to arrive 15 mins prior to appt time and to bring and updated insurance card. Pt is aware of appt location.   °

## 2021-04-27 ENCOUNTER — Other Ambulatory Visit: Payer: Self-pay

## 2021-04-27 ENCOUNTER — Ambulatory Visit: Payer: Medicare HMO | Admitting: Podiatry

## 2021-04-27 DIAGNOSIS — E1149 Type 2 diabetes mellitus with other diabetic neurological complication: Secondary | ICD-10-CM

## 2021-04-27 DIAGNOSIS — L309 Dermatitis, unspecified: Secondary | ICD-10-CM | POA: Diagnosis not present

## 2021-04-27 DIAGNOSIS — L84 Corns and callosities: Secondary | ICD-10-CM | POA: Diagnosis not present

## 2021-04-27 DIAGNOSIS — M84374D Stress fracture, right foot, subsequent encounter for fracture with routine healing: Secondary | ICD-10-CM | POA: Diagnosis not present

## 2021-04-28 ENCOUNTER — Telehealth: Payer: Self-pay | Admitting: Family Medicine

## 2021-04-28 ENCOUNTER — Encounter: Payer: Self-pay | Admitting: Family Medicine

## 2021-04-28 ENCOUNTER — Other Ambulatory Visit: Payer: Self-pay | Admitting: Family Medicine

## 2021-04-28 ENCOUNTER — Ambulatory Visit (INDEPENDENT_AMBULATORY_CARE_PROVIDER_SITE_OTHER): Payer: Medicare HMO | Admitting: Family Medicine

## 2021-04-28 VITALS — BP 136/80 | HR 70 | Temp 98.4°F | Ht 63.0 in | Wt 190.0 lb

## 2021-04-28 DIAGNOSIS — G620 Drug-induced polyneuropathy: Secondary | ICD-10-CM

## 2021-04-28 DIAGNOSIS — Z0000 Encounter for general adult medical examination without abnormal findings: Secondary | ICD-10-CM | POA: Diagnosis not present

## 2021-04-28 DIAGNOSIS — I739 Peripheral vascular disease, unspecified: Secondary | ICD-10-CM | POA: Diagnosis not present

## 2021-04-28 DIAGNOSIS — Z79899 Other long term (current) drug therapy: Secondary | ICD-10-CM | POA: Diagnosis not present

## 2021-04-28 DIAGNOSIS — G8194 Hemiplegia, unspecified affecting left nondominant side: Secondary | ICD-10-CM

## 2021-04-28 DIAGNOSIS — E1140 Type 2 diabetes mellitus with diabetic neuropathy, unspecified: Secondary | ICD-10-CM | POA: Diagnosis not present

## 2021-04-28 DIAGNOSIS — Z89421 Acquired absence of other right toe(s): Secondary | ICD-10-CM

## 2021-04-28 DIAGNOSIS — T451X5A Adverse effect of antineoplastic and immunosuppressive drugs, initial encounter: Secondary | ICD-10-CM

## 2021-04-28 DIAGNOSIS — F331 Major depressive disorder, recurrent, moderate: Secondary | ICD-10-CM | POA: Diagnosis not present

## 2021-04-28 DIAGNOSIS — M069 Rheumatoid arthritis, unspecified: Secondary | ICD-10-CM

## 2021-04-28 DIAGNOSIS — E669 Obesity, unspecified: Secondary | ICD-10-CM

## 2021-04-28 LAB — CBC
HCT: 37.5 % (ref 36.0–46.0)
Hemoglobin: 12.9 g/dL (ref 12.0–15.0)
MCHC: 34.2 g/dL (ref 30.0–36.0)
MCV: 85.7 fl (ref 78.0–100.0)
Platelets: 259 10*3/uL (ref 150.0–400.0)
RBC: 4.38 Mil/uL (ref 3.87–5.11)
RDW: 13.5 % (ref 11.5–15.5)
WBC: 7.4 10*3/uL (ref 4.0–10.5)

## 2021-04-28 LAB — LIPID PANEL
Cholesterol: 188 mg/dL (ref 0–200)
HDL: 48.9 mg/dL (ref >39.00)
NonHDL: 138.64
Total CHOL/HDL Ratio: 4
Triglycerides: 325 mg/dL — ABNORMAL HIGH (ref 0.0–149.0)
VLDL: 65 mg/dL — ABNORMAL HIGH (ref 0.0–40.0)

## 2021-04-28 LAB — COMPREHENSIVE METABOLIC PANEL
ALT: 15 U/L (ref 0–35)
AST: 18 U/L (ref 0–37)
Albumin: 4.1 g/dL (ref 3.5–5.2)
Alkaline Phosphatase: 93 U/L (ref 39–117)
BUN: 10 mg/dL (ref 6–23)
CO2: 23 mEq/L (ref 19–32)
Calcium: 9.1 mg/dL (ref 8.4–10.5)
Chloride: 106 mEq/L (ref 96–112)
Creatinine, Ser: 1.07 mg/dL (ref 0.40–1.20)
GFR: 51.15 mL/min — ABNORMAL LOW (ref >60.00)
Glucose, Bld: 201 mg/dL — ABNORMAL HIGH (ref 70–99)
Potassium: 4.2 mEq/L (ref 3.5–5.1)
Sodium: 137 mEq/L (ref 135–145)
Total Bilirubin: 0.3 mg/dL (ref 0.2–1.2)
Total Protein: 6.9 g/dL (ref 6.0–8.3)

## 2021-04-28 LAB — HEMOGLOBIN A1C: Hgb A1c MFr Bld: 8.3 % — ABNORMAL HIGH (ref 4.6–6.5)

## 2021-04-28 LAB — LDL CHOLESTEROL, DIRECT: Direct LDL: 100 mg/dL

## 2021-04-28 MED ORDER — SEMAGLUTIDE-WEIGHT MANAGEMENT 1.7 MG/0.75ML ~~LOC~~ SOAJ: 1.7000 mg | SUBCUTANEOUS | 0 refills | Status: DC

## 2021-04-28 MED ORDER — SEMAGLUTIDE-WEIGHT MANAGEMENT 0.25 MG/0.5ML ~~LOC~~ SOAJ: 0.2500 mg | SUBCUTANEOUS | 0 refills | Status: DC

## 2021-04-28 MED ORDER — SEMAGLUTIDE-WEIGHT MANAGEMENT 1 MG/0.5ML ~~LOC~~ SOAJ: 1.0000 mg | SUBCUTANEOUS | 0 refills | Status: DC

## 2021-04-28 MED ORDER — OZEMPIC (0.25 OR 0.5 MG/DOSE) 2 MG/1.5ML ~~LOC~~ SOPN: PEN_INJECTOR | SUBCUTANEOUS | 1 refills | Status: DC

## 2021-04-28 MED ORDER — ALPRAZOLAM 0.5 MG PO TABS: 0.5000 mg | ORAL_TABLET | Freq: Two times a day (BID) | ORAL | 5 refills | Status: DC | PRN

## 2021-04-28 MED ORDER — SEMAGLUTIDE-WEIGHT MANAGEMENT 0.5 MG/0.5ML ~~LOC~~ SOAJ: 0.5000 mg | SUBCUTANEOUS | 0 refills | Status: DC

## 2021-04-28 MED ORDER — SEMAGLUTIDE-WEIGHT MANAGEMENT 2.4 MG/0.75ML ~~LOC~~ SOAJ: 2.4000 mg | SUBCUTANEOUS | 0 refills | Status: DC

## 2021-04-28 NOTE — Addendum Note (Signed)
Addended by: Kelle Darting A on: 04/28/2021 03:56 PM ? ? Modules accepted: Orders ? ?

## 2021-04-28 NOTE — Progress Notes (Signed)
Chief Complaint  Patient presents with   Annual Exam    Buzzing noise in Kristina ears.  Mostly left ear but some in the right.     Well Woman Kristina Patel is here for a complete physical.   Kristina last physical was >1 year ago.  Current diet: in general, diet is fair, has not had solid food. Current exercise: none. Weight is down a few lbs due to dietary changes and she denies daytime fatigue. Seatbelt? Yes Advanced directive? No  Health Maintenance Colonoscopy- Yes Shingrix- No DEXA- Yes Mammogram- Yes Tetanus- Yes Pneumonia- Yes Hep C screen- Yes  Obesity Struggling w wt loss for decades. Diet as above. Not exercising. Interested to see if there are any medication options. Interested in [place]. Comorbidities include DM2, GERD, HTN, HLD.   Past Medical History:  Diagnosis Date   Bacterial overgrowth syndrome    Breast mass    Carpal tunnel syndrome    Colon polyps    Degenerative disc disease    Depression    Fall    GERD (gastroesophageal reflux disease)    Gouty arthropathy    Headache    Hyperlipidemia    Hypertension    IBS (irritable bowel syndrome)    constipation   Internal prolapsed hemorrhoids    Iron deficiency anemia    Melanoma (HCC)    Osteoarthritis    Osteoporosis    Ovarian cancer ([place])    Pelvic floor dysfunction 05/05/2016   Peripheral neuropathy    Pulmonary nodules    Renal disease    Rheumatoid arthritis(714.0)    Squamous cell carcinoma skin of abdomen    Uterine cancer ([place])    Vitamin D deficiency      Past Surgical History:  Procedure Laterality Date   ABDOMINAL HYSTERECTOMY     partial   ADENOIDECTOMY     ANAL RECTAL MANOMETRY N/A 04/30/2016   Procedure: ANO RECTAL MANOMETRY;  Surgeon: Mauri Pole, MD;  Location: [facility];  Service: Endoscopy;  Laterality: N/A;   APPENDECTOMY     BREAST SURGERY Bilateral    benign fatty tumor removed   CARPAL TUNNEL RELEASE Right    CERVICAL SPINE SURGERY     CHOLECYSTECTOMY      COLONOSCOPY     KNEE ARTHROPLASTY Right    LUMBAR DISC SURGERY     MASTECTOMY, PARTIAL     MELANOMA EXCISION     OPERATIVE HYSTEROSCOPY     TONSILLECTOMY      Medications  Current Outpatient Medications on File Prior to Visit  Medication Sig Dispense Refill   acetaminophen (TYLENOL) 325 MG tablet Take 325 mg by mouth every 6 (six) hours as needed for headache (pain).     ACIDOPHILUS LACTOBACILLUS PO Take 2 tablets by mouth 3 (three) times daily with meals.      ALPRAZolam (XANAX) 0.5 MG tablet TAKE 1 TABLET BY MOUTH EVERY 12 HOURS AS NEEDED 60 tablet 0   ascorbic acid (VITAMIN C) 500 MG tablet Take by mouth.     ASPIRIN 81 PO Take 81 mg by mouth daily.     aspirin-acetaminophen-caffeine (EXCEDRIN MIGRAINE) 250-250-65 MG tablet Take 1 tablet by mouth daily as needed for headache or migraine.     atorvastatin (LIPITOR) 10 MG tablet Take 10 mg by mouth daily.      baclofen (LIORESAL) 10 MG tablet Take 1 tablet by mouth 3 (three) times daily.     bupivacaine (MARCAINE) 0.25 % injection Inject into the skin.  calcium-vitamin D (OSCAL WITH D) 500-200 MG-UNIT TABS tablet Take 1 tablet by mouth every evening.     Carboxymeth-Cellulose-CitricAc (PLENITY) CAPS Take 1 capsule by mouth 2 (two) times daily before lunch and supper. 60 capsule 0   Certolizumab Pegol (CIMZIA PREFILLED) 2 X 200 MG/ML PSKT Inject into the skin.     Cholecalciferol (D2000 ULTRA STRENGTH) 50 MCG (2000 UT) CAPS Take by mouth.     cyanocobalamin 1000 MCG tablet Take 1,000 mcg by mouth daily.     cyclobenzaprine (FLEXERIL) 10 MG tablet Take by mouth.     cycloSPORINE (RESTASIS) 0.05 % ophthalmic emulsion INSTILL 1 DROP IN EACH EYE EVERY 12 HOURS     denosumab (PROLIA) 60 MG/ML SOSY injection Inject 60 mg into the skin every 6 (six) months. Last injection July 2019     diclofenac Sodium (VOLTAREN) 1 % GEL Apply 2 g topically 4 (four) times daily. 100 g 11   diphenhydrAMINE (BENADRYL) 25 mg capsule Take by mouth.      esomeprazole (NEXIUM) 40 MG capsule Take by mouth.     famotidine (PEPCID) 20 MG tablet Take 20 mg by mouth at bedtime.     Flaxseed, Linseed, (FLAXSEED OIL) 1000 MG CAPS Take 1,000 mg by mouth daily with supper.      fluorometholone (FML) 0.1 % ophthalmic suspension Apply to eye.     glipiZIDE (GLUCOTROL) 5 MG tablet Take 2.5 mg by mouth 2 (two) times daily.     hyoscyamine (LEVSIN SL) 0.125 MG SL tablet Place under the tongue.     iron sucrose (VENOFER) 20 MG/ML injection Inject into the vein.     leflunomide (ARAVA) 10 MG tablet Take 1 tablet by mouth daily.     lidocaine (LIDODERM) 5 % Place 1 patch onto the skin daily as needed (pain). Remove & Discard patch within 12 hours or as directed by MD     lidocaine (XYLOCAINE) 2 % jelly 1 application as needed.     lidocaine (XYLOCAINE) 5 % ointment Apply 1 application topically as needed.     Lifitegrast 5 % SOLN Apply to eye.     Magnesium Oxide 420 MG TABS Take 1 tablet by mouth daily.     methocarbamol (ROBAXIN) 500 MG tablet Take 1 tablet (500 mg total) by mouth every 8 (eight) hours as needed for muscle spasms. 30 tablet 2   metoprolol tartrate (LOPRESSOR) 50 MG tablet Take by mouth.     montelukast (SINGULAIR) 10 MG tablet Take 1 tablet (10 mg total) by mouth at bedtime. 30 tablet 3   Multiple Vitamin (MULTIVITAMIN WITH MINERALS) TABS tablet Take 1 tablet by mouth daily with supper.     nystatin (MYCOSTATIN/NYSTOP) powder Apply topically.     ondansetron (ZOFRAN) 8 MG tablet Take by mouth.     pantoprazole (PROTONIX) 40 MG tablet Take 40 mg by mouth daily.     prednisoLONE acetate (PRED FORTE) 1 % ophthalmic suspension 1 drop 2 (two) times daily.     sertraline (ZOLOFT) 100 MG tablet Take by mouth.     topiramate (TOPAMAX) 25 MG tablet Take 2 tablets (50 mg total) by mouth 2 (two) times daily. 120 tablet 3   triamcinolone acetonide (TRIESENCE) 40 MG/ML SUSP Inject into the articular space.     triamcinolone cream (KENALOG) 0.1 % Apply 1  application topically 2 (two) times daily. 30 g 0   TURMERIC PO Take 900 mg by mouth 3 (three) times daily with meals.  Allergies Allergies  Allergen Reactions   Butalbital-Asa-Caff-Codeine [Fiorinal-Codeine] Anaphylaxis and Swelling   [place] Other (See Comments)    Irritable & hyper   Fiorinal [Butalbital-Aspirin-Caffeine] Anaphylaxis   Penciclovir Anaphylaxis   Penicillins Anaphylaxis    Has patient had a PCN reaction causing immediate rash, facial/tongue/throat swelling, SOB or lightheadedness with hypotension: Yes Has patient had a PCN reaction causing severe rash involving mucus membranes or skin necrosis: No Has patient had a PCN reaction that required hospitalization: No Has patient had a PCN reaction occurring within the last 10 years: No If all of the above answers are "NO", then may proceed with Cephalosporin use.   Buprenorphine Rash   Amitriptyline Other (See Comments)    hyperactivity   Ativan [Lorazepam] Other (See Comments)    Adverse reaction...made patient "act out" - out of Kristina mind   Bactrim [Sulfamethoxazole-Trimethoprim] Diarrhea, Nausea And Vomiting and Other (See Comments)    Severe cramps   Ciprofloxacin Hcl Nausea And Vomiting and Other (See Comments)    hallucinations   Gabapentin    Lisinopril Cough   Tape Other (See Comments)    Allergic to plastic tape. The adhesive causes redness to skin as well as loss of skin. - please use paper tape   Ambien [Zolpidem Tartrate] Other (See Comments)    Sleep walking   Benzodiazepines Cough   Metoclopramide Anxiety   Propoxyphene Rash and Other (See Comments)    Irritable and hyperactive   Zolpidem Tartrate     Other reaction(s): Other (See Comments) Sleep walking Other reaction(s): Other (See Comments) Sleep walking    Review of Systems: Constitutional:  no fevers Eye:  no recent significant change in vision Ears:  No changes in hearing Nose/Mouth/Throat:  no complaints of nasal congestion, no sore  throat Cardiovascular: no chest pain Respiratory:  No shortness of breath Gastrointestinal:  No change in bowel habits GU:  Female: negative for dysuria Integumentary:  no abnormal skin lesions reported Neurologic:  no headaches Endocrine:  denies unexplained weight changes  Exam BP 136/80    Pulse 70    Temp 98.4 F (36.9 C) (Oral)    Ht '5\' 3"'$  (1.6 m)    Wt 190 lb (86.2 kg) Comment: weighed at home today.  declined weighing in the office   SpO2 97%    BMI 33.66 kg/m  General:  well developed, well nourished, in no apparent distress Skin:  no significant moles, warts, or growths Head:  no masses, lesions, or tenderness Eyes:  pupils equal and round, sclera anicteric without injection Ears:  canals without lesions, TMs shiny without retraction, no obvious effusion, no erythema Nose:  nares patent, septum midline, mucosa normal, and no drainage or sinus tenderness Throat/Pharynx:  lips and gingiva without lesion; tongue and uvula midline; non-inflamed pharynx; no exudates or postnasal drainage Neck: neck supple without adenopathy, thyromegaly, or masses Lungs:  clear to auscultation, breath sounds equal bilaterally, no respiratory distress Cardio:  regular rate and rhythm, no bruits or LE edema Abdomen:  abdomen soft, nontender; bowel sounds normal; no masses or organomegaly Genital: Deferred Neuro:  gait normal; deep tendon reflexes normal and symmetric Psych: well oriented with normal range of affect and appropriate judgment/insight  Assessment and Plan  Well adult exam  History of amputation of lesser toe of right foot (HCC), Chronic  Neuropathy due to chemotherapeutic drug (HCC), Chronic  Hemiparesis of left nondominant side, unspecified hemiparesis etiology ([place]), Chronic  Rheumatoid arthritis involving both hands, unspecified whether rheumatoid factor present ([place]),  Chronic  Major depressive disorder, recurrent, moderate (HCC), Chronic  Peripheral vascular disease,  unspecified ([place]), Chronic - Plan: CBC, Comprehensive metabolic panel, Lipid panel  Type 2 diabetes mellitus with diabetic neuropathy, without long-term current use of insulin (HCC) - Plan: Hemoglobin A1c, Microalbumin / creatinine urine ratio  Encounter for long-term (current) use of high-risk medication - Plan: Drug Monitoring Panel [phone] , Urine  Obesity (BMI 30-39.9) - Plan: Semaglutide-Weight Management 0.25 MG/0.5ML SOAJ, Semaglutide-Weight Management 0.5 MG/0.5ML SOAJ, Semaglutide-Weight Management 1 MG/0.5ML SOAJ, Semaglutide-Weight Management 1.7 MG/0.75ML SOAJ, Semaglutide-Weight Management 2.4 MG/0.75ML SOAJ   Well 76 y.o. female. Counseled on diet and exercise. Other orders as above. Advanced directive copy requested.  Shingrix rec'd.  Obesity: Chronic, not controlled. Wegovy trial sent. Recheck in 6 weeks. Could consider Ozempic as she is diabetic.  The patient voiced understanding and agreement to the plan.  [place], DO 04/28/21 2:48 PM

## 2021-04-28 NOTE — Patient Instructions (Addendum)
Give [place] 2-3 business days to get the results of your labs back.  ? ?Keep the diet clean and stay active. ? ?The Shingrix vaccine (for shingles) is a 2 shot series. It can make people feel low energy, achy and almost like they have the flu for 48 hours after injection. Please plan accordingly when deciding on when to get this shot. Call your pharmacy or go through the [place] to get this. The second shot of the series is less severe regarding the side effects, but it still lasts 48 hours.  ? ?Please get me a copy of your advanced directive form at your convenience.  ? ?Let us know if you need anything. ?

## 2021-04-28 NOTE — Telephone Encounter (Signed)
Patient sent a my chart message which PCP will respond to. ?

## 2021-04-28 NOTE — Telephone Encounter (Signed)
Pt states weight loss rx sent it was too expensive and she is hoping to look at other options.  ?

## 2021-05-02 ENCOUNTER — Encounter: Payer: Self-pay | Admitting: Family Medicine

## 2021-05-05 NOTE — Progress Notes (Signed)
Subjective: ?76 year old female presents the office today for follow-up evaluation of right foot pain which has been ongoing for the last several months.  She has been in the surgical shoe and overall she feels much better than she did last visit.  She said the bunions was causing the majority discomfort at this time (pointing to a callus).  No open sores.  No swelling redness or any drainage.  Also the area in the top of the foot which has been using triamcinolone cream is much improved. ? ?Objective: ?AAO x3, NAD-husband present today. ?DP/PT pulses palpable bilaterally, CRT less than 3 seconds ?There is no specific area pinpoint tenderness today.  In particular the tenderness that was present the dorsal aspect the fourth and fifth metatarsals has improved.  The area dry, scaly, erythematous skin is also much improved.  No hyperpigmented lesions.  There is no open lesions. ?Hyperkeratotic lesion submetatarsal area right foot without any underlying ulceration drainage or signs of infection. ?Bunions present with prominent metatarsal heads. ?No pain with calf compression, swelling, warmth, erythema ? ?Assessment: ?Right foot pain, concern for stress fracture; dermatitis; hyperkeratotic lesions ? ?Plan: ?-All treatment options discussed with the patient including all alternatives, risks, complications.  ?-Today's exam there is no pain and there is trace edema.  Orders to transition back to regular shoe as tolerated.  On the way out she states that her regular shoe felt great.  Continue monitor for any reoccurrence and should there be any increase in swelling or pain she is to return with surgical shoe and let me know. ?-Debrided the callus with any complications or bleeding. ?-Continue steroid cream until the area completely resolves which is close. ? ?Follow-up as scheduled or sooner if needed. ? ?[name] DPM ?

## 2021-05-08 ENCOUNTER — Encounter: Payer: Self-pay | Admitting: Family Medicine

## 2021-05-08 ENCOUNTER — Other Ambulatory Visit: Payer: Self-pay | Admitting: Family Medicine

## 2021-05-08 MED ORDER — FENOFIBRATE 48 MG PO TABS: 48.0000 mg | ORAL_TABLET | Freq: Every day | ORAL | 3 refills | Status: DC

## 2021-05-14 ENCOUNTER — Other Ambulatory Visit: Payer: Medicare HMO

## 2021-05-14 ENCOUNTER — Encounter: Payer: Medicare HMO | Admitting: Genetic Counselor

## 2021-05-26 DIAGNOSIS — M19012 Primary osteoarthritis, left shoulder: Secondary | ICD-10-CM | POA: Diagnosis not present

## 2021-06-09 ENCOUNTER — Ambulatory Visit (INDEPENDENT_AMBULATORY_CARE_PROVIDER_SITE_OTHER): Payer: Medicare HMO | Admitting: Family Medicine

## 2021-06-09 ENCOUNTER — Encounter: Payer: Self-pay | Admitting: Family Medicine

## 2021-06-09 VITALS — BP 122/70 | HR 63 | Temp 97.5°F | Ht 63.0 in | Wt 184.2 lb

## 2021-06-09 DIAGNOSIS — E1140 Type 2 diabetes mellitus with diabetic neuropathy, unspecified: Secondary | ICD-10-CM | POA: Diagnosis not present

## 2021-06-09 DIAGNOSIS — H9312 Tinnitus, left ear: Secondary | ICD-10-CM

## 2021-06-09 DIAGNOSIS — E669 Obesity, unspecified: Secondary | ICD-10-CM

## 2021-06-09 DIAGNOSIS — Z79899 Other long term (current) drug therapy: Secondary | ICD-10-CM

## 2021-06-09 MED ORDER — SEMAGLUTIDE (1 MG/DOSE) 4 MG/3ML ~~LOC~~ SOPN: 1.0000 mg | PEN_INJECTOR | SUBCUTANEOUS | 1 refills | Status: DC

## 2021-06-09 NOTE — Progress Notes (Signed)
Chief Complaint  ?Patient presents with  ? Follow-up  ? ? ?Subjective: ?Patient is a 76 y.o. female here for f/u. ? ?Patient was started semaglutide for both diabetes and obesity.  She has a few doses of the 0.5 mg weekly strength left.  She is tolerating this well and reports compliance.  She is lost 6 pounds since her last visit in early March.  She is pleased with her weight loss.  The medication was affordable.  Wegovy was not. ? ?Patient has a several month history of buzzing in her left ear and hearing birds tripping in her right ear.  There is no drainage from her ear and she now denies any pain.  She has not tried anything at home.  Her hearing is largely unaffected.  She is requesting to see an ENT doctor. ? ?Past Medical History:  ?Diagnosis Date  ? Bacterial overgrowth syndrome   ? Breast mass   ? Carpal tunnel syndrome   ? Colon polyps   ? Degenerative disc disease   ? Depression   ? Fall   ? GERD (gastroesophageal reflux disease)   ? Gouty arthropathy   ? Headache   ? Hyperlipidemia   ? Hypertension   ? IBS (irritable bowel syndrome)   ? constipation  ? Internal prolapsed hemorrhoids   ? Iron deficiency anemia   ? Melanoma ([place])   ? Osteoarthritis   ? Osteoporosis   ? Ovarian cancer ([place])   ? Pelvic floor dysfunction 05/05/2016  ? Peripheral neuropathy   ? Pulmonary nodules   ? Renal disease   ? Rheumatoid arthritis(714.0)   ? Squamous cell carcinoma skin of abdomen   ? Uterine cancer ([place])   ? Vitamin D deficiency   ? ? ?Objective: ?BP 122/70   Pulse 63   Temp (!) 97.5 ?F (36.4 ?C) (Oral)   Ht '5\' 3"'$  (1.6 m)   Wt 184 lb 4 oz (83.6 kg)   SpO2 95%   BMI 32.64 kg/m?  ?General: Awake, appears stated age ?Ears: Patent without discharge or erythema, TMs negative bilaterally ?Heart: RRR, no LE edema ?Lungs: CTAB, no rales, wheezes or rhonchi. No accessory muscle use ?Psych: Age appropriate judgment and insight, normal affect and mood ? ?Assessment and Plan: ?Obesity (BMI 30-39.9) ? ?Buzzing in ear, left  - Plan: Ambulatory referral to ENT ? ?Type 2 diabetes mellitus with diabetic neuropathy, without long-term current use of insulin ([place]) - Plan: Microalbumin / creatinine urine ratio ? ?Encounter for long-term (current) use of high-risk medication - Plan: DRUG MONITORING, PANEL 8 WITH CONFIRMATION, URINE ? ?Chronic, improving.  Increase Ozempic from 0.5 mg weekly to 1 mg weekly.  If she tolerates this we will increase to 2 mg weekly.  Counseled on diet and exercise. ?Chronic, not controlled.  Refer to ENT. ?Check urine. ?Check urine. ?Follow-up in 6 months or as needed. ?The patient voiced understanding and agreement to the plan. ? ?Shelda Pal, DO ?06/09/21  ?3:26 PM ? ? ? ? ?

## 2021-06-09 NOTE — Patient Instructions (Addendum)
Keep the diet clean and stay active. ? ?Strong work with your weight loss. ? ?If you do not hear anything about your referral in the next 1-2 weeks, call our office and ask for an update. ? ?Let us know if you need anything. ?

## 2021-06-10 LAB — MICROALBUMIN / CREATININE URINE RATIO
Creatinine,U: 88.8 mg/dL
Microalb Creat Ratio: 2 mg/g (ref 0.0–30.0)
Microalb, Ur: 1.7 mg/dL (ref 0.0–1.9)

## 2021-06-11 ENCOUNTER — Inpatient Hospital Stay: Payer: Medicare HMO | Attending: Podiatry | Admitting: Genetic Counselor

## 2021-06-11 ENCOUNTER — Inpatient Hospital Stay: Payer: Medicare HMO

## 2021-06-12 LAB — DRUG MONITORING, PANEL 8 WITH CONFIRMATION, URINE
6 Acetylmorphine: NEGATIVE ng/mL (ref <10)
Alcohol Metabolites: NEGATIVE ng/mL (ref <500)
Alphahydroxyalprazolam: 95 ng/mL — ABNORMAL HIGH (ref <25)
Alphahydroxymidazolam: NEGATIVE ng/mL (ref <50)
Alphahydroxytriazolam: NEGATIVE ng/mL (ref <50)
Aminoclonazepam: NEGATIVE ng/mL (ref <25)
Amphetamines: NEGATIVE ng/mL (ref <500)
Benzodiazepines: POSITIVE ng/mL — AB (ref <100)
Buprenorphine, Urine: NEGATIVE ng/mL (ref <5)
Cocaine Metabolite: NEGATIVE ng/mL (ref <150)
Codeine: NEGATIVE ng/mL (ref <50)
Creatinine: 90.2 mg/dL (ref >=20.0)
Hydrocodone: NEGATIVE ng/mL (ref <50)
Hydromorphone: 589 ng/mL — ABNORMAL HIGH (ref <50)
Hydroxyethylflurazepam: NEGATIVE ng/mL (ref <50)
Lorazepam: NEGATIVE ng/mL (ref <50)
MDMA: NEGATIVE ng/mL (ref <500)
Marijuana Metabolite: NEGATIVE ng/mL (ref <20)
Morphine: NEGATIVE ng/mL (ref <50)
Nordiazepam: NEGATIVE ng/mL (ref <50)
Norhydrocodone: NEGATIVE ng/mL (ref <50)
Opiates: POSITIVE ng/mL — AB (ref <100)
Oxazepam: NEGATIVE ng/mL (ref <50)
Oxidant: NEGATIVE ug/mL (ref <200)
Oxycodone: NEGATIVE ng/mL (ref <100)
Temazepam: NEGATIVE ng/mL (ref <50)
pH: 6.3 (ref 4.5–9.0)

## 2021-06-12 LAB — DM TEMPLATE

## 2021-06-15 ENCOUNTER — Ambulatory Visit: Payer: Medicare HMO | Admitting: Podiatry

## 2021-06-15 DIAGNOSIS — L84 Corns and callosities: Secondary | ICD-10-CM

## 2021-06-15 DIAGNOSIS — M7751 Other enthesopathy of right foot: Secondary | ICD-10-CM | POA: Diagnosis not present

## 2021-06-15 DIAGNOSIS — E1149 Type 2 diabetes mellitus with other diabetic neurological complication: Secondary | ICD-10-CM | POA: Diagnosis not present

## 2021-06-15 DIAGNOSIS — B351 Tinea unguium: Secondary | ICD-10-CM

## 2021-06-15 DIAGNOSIS — M79675 Pain in left toe(s): Secondary | ICD-10-CM | POA: Diagnosis not present

## 2021-06-15 DIAGNOSIS — M79674 Pain in right toe(s): Secondary | ICD-10-CM

## 2021-06-15 MED ORDER — TRIAMCINOLONE ACETONIDE 10 MG/ML IJ SUSP
10.0000 mg | Freq: Once | INTRAMUSCULAR | Status: AC
  Administered 2021-06-15: 10 mg

## 2021-06-15 NOTE — Patient Instructions (Signed)

## 2021-06-15 NOTE — Progress Notes (Signed)
Subjective: ?76 year old female presents the office today for follow-up evaluation of right foot pain which has been ongoing for the last several months.  As well as to come elongated toenails and calluses that she cannot trim her self.  She states that she tries to trim out her nail corners and they started to bleed.  She also recently tried to trim the callus on the bottom of her right foot.  She still getting discomfort to the area and the top of the right foot.  She did previous had an injection which was somewhat helpful.  No recent injuries or changes otherwise.  ? ?Objective: ?AAO x3, NAD-husband present today. ?DP/PT pulses palpable bilaterally, CRT less than 3 seconds ?There is no specific area pinpoint tenderness today.  There is tenderness palpation along the third interspace of the right foot.  There is no significant edema.  There is no pain with imaging range of motion.  Erythema or warmth. ?Nails appear to be hypertrophic, dystrophic with yellow, brown discoloration.  There is tenderness and nails 1-5 bilaterally except the right fourth toe which did partially amputated.  Hyperkeratotic lesions noted bilateral medial hallux, MPJ without any underlying ulceration drainage or signs of infection.  Also hyperkeratotic lesion right foot submetatarsal 2 which she recently trimmed herself there is mild dried blood but there is no open sores. ?Bunions present with prominent metatarsal heads. ?No pain with calf compression, swelling, warmth, erythema ? ?Assessment: ?Right foot pain, capsulitis, possible neuroma; symptomatic onychosis, hyperkeratotic lesions ? ?Plan: ?-All treatment options discussed with the patient including all alternatives, risks, complications.  ?-She is continued have tenderness along the third interspace of the right foot.  She describes some more sharp pains present upon palpation.  Discussed capsulitis versus possible neuroma.  Steroid injection was formed.  Skin was prepped with  alcohol and mixture of 1 cc Kenalog 10, 0.5 cc of Marcaine plain, 0.5 cc lidocaine plain was infiltrated any complications.  Postinjection care discussed.  Continue with supportive shoe gear. ?-Sharply debride the nails x9 without any complications or bleeding ?-Sharply debrided hyperkeratotic lesions x4 without any complications or bleeding. ?-Daily foot inspection. ? ?Return in about 9 weeks (around 08/17/2021). ? ?[name] DPM ?

## 2021-06-17 ENCOUNTER — Encounter: Payer: Self-pay | Admitting: Family Medicine

## 2021-06-22 ENCOUNTER — Other Ambulatory Visit: Payer: Self-pay | Admitting: Family Medicine

## 2021-06-23 DIAGNOSIS — M25512 Pain in left shoulder: Secondary | ICD-10-CM | POA: Diagnosis not present

## 2021-06-23 DIAGNOSIS — M542 Cervicalgia: Secondary | ICD-10-CM | POA: Diagnosis not present

## 2021-06-23 DIAGNOSIS — M6281 Muscle weakness (generalized): Secondary | ICD-10-CM | POA: Diagnosis not present

## 2021-07-01 DIAGNOSIS — M6281 Muscle weakness (generalized): Secondary | ICD-10-CM | POA: Diagnosis not present

## 2021-07-01 DIAGNOSIS — M542 Cervicalgia: Secondary | ICD-10-CM | POA: Diagnosis not present

## 2021-07-01 DIAGNOSIS — M25512 Pain in left shoulder: Secondary | ICD-10-CM | POA: Diagnosis not present

## 2021-07-07 ENCOUNTER — Other Ambulatory Visit: Payer: Self-pay | Admitting: Family Medicine

## 2021-07-07 DIAGNOSIS — M25562 Pain in left knee: Secondary | ICD-10-CM

## 2021-07-14 DIAGNOSIS — M25512 Pain in left shoulder: Secondary | ICD-10-CM | POA: Diagnosis not present

## 2021-07-14 DIAGNOSIS — M6281 Muscle weakness (generalized): Secondary | ICD-10-CM | POA: Diagnosis not present

## 2021-07-14 DIAGNOSIS — M542 Cervicalgia: Secondary | ICD-10-CM | POA: Diagnosis not present

## 2021-07-28 DIAGNOSIS — M542 Cervicalgia: Secondary | ICD-10-CM | POA: Diagnosis not present

## 2021-07-28 DIAGNOSIS — M25512 Pain in left shoulder: Secondary | ICD-10-CM | POA: Diagnosis not present

## 2021-07-28 DIAGNOSIS — M6281 Muscle weakness (generalized): Secondary | ICD-10-CM | POA: Diagnosis not present

## 2021-08-03 DIAGNOSIS — M75102 Unspecified rotator cuff tear or rupture of left shoulder, not specified as traumatic: Secondary | ICD-10-CM | POA: Diagnosis not present

## 2021-08-03 DIAGNOSIS — M25562 Pain in left knee: Secondary | ICD-10-CM | POA: Diagnosis not present

## 2021-08-03 DIAGNOSIS — M19012 Primary osteoarthritis, left shoulder: Secondary | ICD-10-CM | POA: Diagnosis not present

## 2021-08-03 DIAGNOSIS — M7502 Adhesive capsulitis of left shoulder: Secondary | ICD-10-CM | POA: Diagnosis not present

## 2021-08-03 DIAGNOSIS — M1712 Unilateral primary osteoarthritis, left knee: Secondary | ICD-10-CM | POA: Diagnosis not present

## 2021-08-09 DIAGNOSIS — M1712 Unilateral primary osteoarthritis, left knee: Secondary | ICD-10-CM | POA: Diagnosis not present

## 2021-08-09 DIAGNOSIS — M19012 Primary osteoarthritis, left shoulder: Secondary | ICD-10-CM | POA: Diagnosis not present

## 2021-08-10 ENCOUNTER — Other Ambulatory Visit: Payer: Self-pay | Admitting: Family Medicine

## 2021-08-11 ENCOUNTER — Emergency Department (HOSPITAL_BASED_OUTPATIENT_CLINIC_OR_DEPARTMENT_OTHER)
Admission: EM | Admit: 2021-08-11 | Discharge: 2021-08-11 | Disposition: A | Discharge status: Home / Self Care | Payer: No Typology Code available for payment source | Attending: Emergency Medicine | Admitting: Emergency Medicine

## 2021-08-11 ENCOUNTER — Emergency Department (HOSPITAL_BASED_OUTPATIENT_CLINIC_OR_DEPARTMENT_OTHER): Payer: No Typology Code available for payment source

## 2021-08-11 ENCOUNTER — Other Ambulatory Visit: Payer: Self-pay

## 2021-08-11 ENCOUNTER — Encounter (HOSPITAL_BASED_OUTPATIENT_CLINIC_OR_DEPARTMENT_OTHER): Payer: Self-pay

## 2021-08-11 DIAGNOSIS — Z85828 Personal history of other malignant neoplasm of skin: Secondary | ICD-10-CM | POA: Insufficient documentation

## 2021-08-11 DIAGNOSIS — Z7982 Long term (current) use of aspirin: Secondary | ICD-10-CM | POA: Diagnosis not present

## 2021-08-11 DIAGNOSIS — Z20822 Contact with and (suspected) exposure to covid-19: Secondary | ICD-10-CM | POA: Insufficient documentation

## 2021-08-11 DIAGNOSIS — R0602 Shortness of breath: Secondary | ICD-10-CM | POA: Diagnosis present

## 2021-08-11 DIAGNOSIS — J069 Acute upper respiratory infection, unspecified: Secondary | ICD-10-CM

## 2021-08-11 LAB — CBC WITH DIFFERENTIAL/PLATELET
Abs Immature Granulocytes: 0.04 10*3/uL (ref 0.00–0.07)
Basophils Absolute: 0.1 10*3/uL (ref 0.0–0.1)
Basophils Relative: 1 %
Eosinophils Absolute: 0.3 10*3/uL (ref 0.0–0.5)
Eosinophils Relative: 3 %
HCT: 38 % (ref 36.0–46.0)
Hemoglobin: 13.1 g/dL (ref 12.0–15.0)
Immature Granulocytes: 0 %
Lymphocytes Relative: 35 %
Lymphs Abs: 3.5 10*3/uL (ref 0.7–4.0)
MCH: 29.2 pg (ref 26.0–34.0)
MCHC: 34.5 g/dL (ref 30.0–36.0)
MCV: 84.6 fL (ref 80.0–100.0)
Monocytes Absolute: 1.1 10*3/uL — ABNORMAL HIGH (ref 0.1–1.0)
Monocytes Relative: 11 %
Neutro Abs: 4.9 10*3/uL (ref 1.7–7.7)
Neutrophils Relative %: 50 %
Platelets: 276 10*3/uL (ref 150–400)
RBC: 4.49 MIL/uL (ref 3.87–5.11)
RDW: 13.1 % (ref 11.5–15.5)
WBC: 9.8 10*3/uL (ref 4.0–10.5)
nRBC: 0 % (ref 0.0–0.2)

## 2021-08-11 LAB — COMPREHENSIVE METABOLIC PANEL
ALT: 20 U/L (ref 0–44)
AST: 22 U/L (ref 15–41)
Albumin: 3.9 g/dL (ref 3.5–5.0)
Alkaline Phosphatase: 57 U/L (ref 38–126)
Anion gap: 9 (ref 5–15)
BUN: 17 mg/dL (ref 8–23)
CO2: 18 mmol/L — ABNORMAL LOW (ref 22–32)
Calcium: 9.5 mg/dL (ref 8.9–10.3)
Chloride: 108 mmol/L (ref 98–111)
Creatinine, Ser: 1.55 mg/dL — ABNORMAL HIGH (ref 0.44–1.00)
GFR, Estimated: 35 mL/min — ABNORMAL LOW (ref >60)
Glucose, Bld: 110 mg/dL — ABNORMAL HIGH (ref 70–99)
Potassium: 4 mmol/L (ref 3.5–5.1)
Sodium: 135 mmol/L (ref 135–145)
Total Bilirubin: 0.4 mg/dL (ref 0.3–1.2)
Total Protein: 7.2 g/dL (ref 6.5–8.1)

## 2021-08-11 LAB — SARS CORONAVIRUS 2 BY RT PCR: SARS Coronavirus 2 by RT PCR: NEGATIVE

## 2021-08-11 LAB — LIPASE, BLOOD: Lipase: 47 U/L (ref 11–51)

## 2021-08-11 MED ORDER — ACETAMINOPHEN 325 MG PO TABS
650.0000 mg | ORAL_TABLET | Freq: Once | ORAL | Status: AC
  Administered 2021-08-11: 650 mg via ORAL
  Filled 2021-08-11: qty 2

## 2021-08-11 MED ORDER — AZITHROMYCIN 250 MG PO TABS: 250.0000 mg | ORAL_TABLET | Freq: Every day | ORAL | 0 refills | Status: DC

## 2021-08-11 MED ORDER — IOHEXOL 300 MG/ML  SOLN
100.0000 mL | Freq: Once | INTRAMUSCULAR | Status: AC | PRN
  Administered 2021-08-11: 80 mL via INTRAVENOUS

## 2021-08-11 MED ORDER — OXYMETAZOLINE HCL 0.05 % NA SOLN
2.0000 | Freq: Once | NASAL | Status: AC
  Administered 2021-08-11: 2 via NASAL
  Filled 2021-08-11: qty 30

## 2021-08-11 MED ORDER — AZITHROMYCIN 250 MG PO TABS
500.0000 mg | ORAL_TABLET | Freq: Once | ORAL | Status: AC
  Administered 2021-08-11: 500 mg via ORAL
  Filled 2021-08-11: qty 2

## 2021-08-11 MED ORDER — SODIUM CHLORIDE 0.9 % IV BOLUS
1000.0000 mL | Freq: Once | INTRAVENOUS | Status: AC
  Administered 2021-08-11: 1000 mL via INTRAVENOUS

## 2021-08-11 MED ORDER — GUAIFENESIN 100 MG/5ML PO LIQD: 10.0000 mL | Freq: Four times a day (QID) | ORAL | 0 refills | Status: DC | PRN

## 2021-08-11 MED ORDER — IPRATROPIUM-ALBUTEROL 0.5-2.5 (3) MG/3ML IN SOLN
3.0000 mL | Freq: Once | RESPIRATORY_TRACT | Status: AC
Start: 2021-08-11 — End: 2021-08-11
  Administered 2021-08-11: 3 mL via RESPIRATORY_TRACT
  Filled 2021-08-11: qty 3

## 2021-08-11 MED ORDER — ONDANSETRON HCL 4 MG/2ML IJ SOLN
4.0000 mg | Freq: Once | INTRAMUSCULAR | Status: AC
  Administered 2021-08-11: 4 mg via INTRAVENOUS
  Filled 2021-08-11: qty 2

## 2021-08-11 MED ORDER — GUAIFENESIN 100 MG/5ML PO LIQD
10.0000 mL | Freq: Once | ORAL | Status: AC
  Administered 2021-08-11: 10 mL via ORAL
  Filled 2021-08-11: qty 10

## 2021-08-11 NOTE — ED Notes (Signed)
Patient transported to CT 

## 2021-08-11 NOTE — Discharge Instructions (Signed)
Take Motrin and Tylenol for fevers, chills, body aches.  Robitussin for cough.  Azithromycin biotic sent to pharmacy.

## 2021-08-11 NOTE — ED Provider Notes (Incomplete)
[facility] Provider Note   CSN: [id number] Arrival date & time: 08/11/21  6962     History {Add pertinent medical, surgical, social history, OB history to HPI:1} Chief Complaint  Patient presents with   Cough   Shortness of Breath    Kristina Patel is a 76 y.o. female.  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  Patient states she receives rituximab injections and typically stays home for the first week after the injection.  States this week she went out with her granddaughter.  States shortly after she began having sinus pressure, nasal congestion, nausea, fever, and sore throat.  States over the last 2 days she has had decreased appetite and a 5 pound weight loss.  The history is provided by the patient. No language interpreter was used.  Cough Associated symptoms: fever, shortness of breath and sore throat   Associated symptoms: no chest pain, no chills, no ear pain and no rash   Shortness of Breath Associated symptoms: cough, fever and sore throat   Associated symptoms: no abdominal pain, no chest pain, no ear pain, no rash and no vomiting        Home Medications Prior to Admission medications   Medication Sig Start Date End Date Taking? Authorizing Provider  acetaminophen (TYLENOL) 325 MG tablet Take 325 mg by mouth every 6 (six) hours as needed for headache (pain).    [provider]  ACIDOPHILUS LACTOBACILLUS PO Take 2 tablets by mouth 3 (three) times daily with meals.     [provider]  ALPRAZolam Duanne Moron) 0.5 MG tablet Take 1 tablet (0.5 mg total) by mouth every 12 (twelve) hours as needed. 04/28/21   Shelda Pal, DO  ascorbic acid (VITAMIN C) 500 MG tablet Take by mouth.    [provider]  ASPIRIN 81 PO Take 81 mg by mouth daily.     [provider]  aspirin-acetaminophen-caffeine (EXCEDRIN MIGRAINE) [phone] MG tablet Take 1 tablet by mouth daily as needed for headache or migraine.    [provider]  atorvastatin (LIPITOR) 10 MG tablet Take 10 mg by mouth daily.     [provider]  baclofen (LIORESAL) 10 MG tablet Take 1 tablet by mouth 3 (three) times daily. 05/11/19   [provider]  bupivacaine (MARCAINE) 0.25 % injection Inject into the skin. 01/28/20   [provider]  calcium-vitamin D (OSCAL WITH D) 500-200 MG-UNIT TABS tablet Take 1 tablet by mouth every evening.    [provider]  Carboxymeth-Cellulose-CitricAc (PLENITY) CAPS Take 1 capsule by mouth 2 (two) times daily before lunch and supper. 08/13/20   Shelda Pal, DO  Certolizumab Pegol (CIMZIA PREFILLED) 2 X 200 MG/ML PSKT Inject into the skin. 03/24/20   [provider]  Cholecalciferol (D2000 ULTRA STRENGTH) 50 MCG (2000 UT) CAPS Take by mouth.    [provider]  cyanocobalamin 1000 MCG tablet Take 1,000 mcg by mouth daily.    [provider]  cyclobenzaprine (FLEXERIL) 10 MG tablet Take by mouth. 11/05/19   [provider]  cycloSPORINE (RESTASIS) 0.05 % ophthalmic emulsion INSTILL 1 DROP IN [facility] EYE EVERY 12 HOURS 12/14/19   [provider]  denosumab (PROLIA) 60 MG/ML SOSY injection Inject 60 mg into the skin every 6 (six) months. Last injection July 2019  [provider]  diclofenac Sodium (VOLTAREN) 1 % GEL Apply 2 g topically 4 (four) times daily. 01/09/19   [name], DPM  diphenhydrAMINE (BENADRYL) 25 mg capsule Take by mouth.    [provider]  esomeprazole (NEXIUM) 40 MG capsule Take by mouth.    [provider]  famotidine (PEPCID) 20 MG tablet Take 20 mg by mouth at bedtime.    [provider]  fenofibrate (TRICOR) 48 MG tablet Take 1 tablet (48 mg total) by mouth daily. 05/08/21   Wendling,  Crosby Oyster, DO  Flaxseed, Linseed, (FLAXSEED OIL) 1000 MG CAPS Take 1,000 mg by mouth daily with supper.     [provider]  fluorometholone (FML) 0.1 % ophthalmic suspension Apply to eye. 12/14/19   [provider]  glipiZIDE (GLUCOTROL) 5 MG tablet Take 2.5 mg by mouth 2 (two) times daily.    [provider]  hyoscyamine (LEVSIN SL) 0.125 MG SL tablet Place under the tongue. 07/05/19   [provider]  iron sucrose (VENOFER) 20 MG/ML injection Inject into the vein. 08/23/19   [provider]  leflunomide (ARAVA) 10 MG tablet Take 1 tablet by mouth daily. 02/27/19   [provider]  lidocaine (LIDODERM) 5 % Place 1 patch onto the skin daily as needed (pain). Remove & Discard patch within 12 hours or as directed by MD    [provider]  lidocaine (XYLOCAINE) 2 % jelly 1 application as needed.    [provider]  lidocaine (XYLOCAINE) 5 % ointment Apply 1 application topically as needed.    [provider]  Lifitegrast 5 % SOLN Apply to eye. 02/26/20   [provider]  Magnesium Oxide 420 MG TABS Take 1 tablet by mouth daily. 04/17/19   [provider]  methocarbamol (ROBAXIN) 500 MG tablet Take 1 tablet (500 mg total) by mouth every 8 (eight) hours as needed for muscle spasms. 09/05/19   Shelda Pal, DO  metoprolol tartrate (LOPRESSOR) 50 MG tablet Take by mouth.    [provider]  montelukast (SINGULAIR) 10 MG tablet Take 1 tablet (10 mg total) by mouth at bedtime. 12/06/18   Shelda Pal, DO  Multiple Vitamin (MULTIVITAMIN WITH MINERALS) TABS tablet Take 1 tablet by mouth daily with supper.    [provider]  nystatin (MYCOSTATIN/NYSTOP) powder Apply topically. 12/12/19   [provider]  ondansetron (ZOFRAN) 8 MG tablet Take by mouth. 12/28/19   [provider]  OZEMPIC, 1 MG/DOSE, 4 MG/3ML SOPN DIAL AND INJECT UNDER THE SKIN 1 MG WEEKLY  08/10/21   Wendling, Crosby Oyster, DO  pantoprazole (PROTONIX) 40 MG tablet Take 40 mg by mouth daily.    [provider]  prednisoLONE acetate (PRED FORTE) 1 % ophthalmic suspension 1 drop 2 (two) times daily. 07/05/19   [provider]  sertraline (ZOLOFT) 100 MG tablet Take by mouth. 02/01/20   [provider]  topiramate (TOPAMAX) 25 MG tablet Take 2 tablets (50 mg total) by mouth 2 (two) times daily. 09/23/15   Garvin Fila, MD  triamcinolone acetonide (TRIESENCE) 40 MG/ML SUSP Inject into the articular space. 01/28/20   [provider]  triamcinolone cream (KENALOG) 0.1 % Apply 1 application topically 2 (two) times daily. 03/31/21   [name], DPM  TURMERIC PO Take 900 mg by mouth 3 (three) times daily with meals.    [provider]      Allergies    Butalbital-asa-caff-codeine [fiorinal-codeine],  Darvon, Fiorinal [butalbital-aspirin-caffeine], Penciclovir, Penicillins, Buprenorphine, Amitriptyline, Ativan [lorazepam], Bactrim [sulfamethoxazole-trimethoprim], Ciprofloxacin hcl, Gabapentin, Lisinopril, Tape, Ambien [zolpidem tartrate], Benzodiazepines, Metoclopramide, Propoxyphene, and Zolpidem tartrate    Review of Systems   Review of Systems  Constitutional:  Positive for fever. Negative for chills.  HENT:  Positive for congestion and sore throat. Negative for ear pain.   Eyes:  Negative for pain and visual disturbance.  Respiratory:  Positive for cough and shortness of breath.   Cardiovascular:  Negative for chest pain and palpitations.  Gastrointestinal:  Negative for abdominal pain and vomiting.  Genitourinary:  Negative for dysuria and hematuria.  Musculoskeletal:  Negative for arthralgias and back pain.  Skin:  Negative for color change and rash.  Neurological:  Negative for seizures and syncope.  All other systems reviewed and are negative.   Physical Exam Updated Vital Signs BP (!) 148/84 (BP Location: Right Arm)   Pulse  98   Temp 99.3 F (37.4 C) (Oral)   Resp (!) 22   Ht '5\' 3"'$  (1.6 m)   Wt 76.7 kg   SpO2 100%   BMI 29.94 kg/m  Physical Exam Vitals and nursing note reviewed.  Constitutional:      General: She is not in acute distress.    Appearance: She is well-developed. She is ill-appearing.  HENT:     Head: Normocephalic and atraumatic.     Comments: Submandibular adenopathy present.  Newness to palpation of maxillary and frontal sinuses.    Nose: Congestion present.  Eyes:     Conjunctiva/sclera: Conjunctivae normal.  Cardiovascular:     Rate and Rhythm: Normal rate and regular rhythm.     Heart sounds: No murmur heard. Pulmonary:     Effort: Pulmonary effort is normal. No respiratory distress.     Breath sounds: Normal breath sounds.  Abdominal:     Palpations: Abdomen is soft.     Tenderness: There is no abdominal tenderness.  Musculoskeletal:        General: No swelling.     Cervical back: Neck supple.     Right lower leg: No edema.     Left lower leg: No edema.  Lymphadenopathy:     Cervical: No cervical adenopathy.  Skin:    General: Skin is warm and dry.     Capillary Refill: Capillary refill takes less than 2 seconds.     Findings: No rash.  Neurological:     General: No focal deficit present.     Mental Status: She is alert and oriented to person, place, and time.     GCS: GCS eye subscore is 4. GCS verbal subscore is 5. GCS motor subscore is 6.  Psychiatric:        Mood and Affect: Mood normal.     ED Results / Procedures / Treatments   Labs (all labs ordered are listed, but only abnormal results are displayed) Labs Reviewed  SARS CORONAVIRUS 2 BY RT PCR  CBC WITH DIFFERENTIAL/PLATELET  BASIC METABOLIC PANEL    EKG EKG Interpretation  Date/Time:  Tuesday August 11 2021 07:50:44 EDT Ventricular Rate:  101 PR Interval:  178 QRS Duration: 102 QT Interval:  353 QTC Calculation: 458 R Axis:   -4 Text Interpretation: Sinus tachycardia Low voltage, precordial  leads Confirmed by Campbell Stall ([id number]) on 0/96/0454 7:54:05 AM  Radiology No results found.  Procedures Procedures  {Document cardiac monitor, telemetry assessment procedure when appropriate:1}  Medications Ordered in ED Medications  oxymetazoline (AFRIN) 0.05 % nasal spray 2 spray (  has no administration in time range)  guaiFENesin (ROBITUSSIN) 100 MG/5ML liquid 10 mL (has no administration in time range)  sodium chloride 0.9 % bolus 1,000 mL (has no administration in time range)  acetaminophen (TYLENOL) tablet 650 mg (has no administration in time range)  ipratropium-albuterol (DUONEB) 0.5-2.5 (3) MG/3ML nebulizer solution 3 mL (has no administration in time range)    ED Course/ Medical Decision Making/ A&P                           Medical Decision Making Amount and/or Complexity of Data Reviewed Labs: ordered. Radiology: ordered.  Risk OTC drugs. Prescription drug management.   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  On exam patient is ill-appearing, diaphoretic, history of 99.3 F, minimally hypertensive, and tachypneic.  Patient has equal bilateral breath sounds with no adventitious lung sounds.  Patient has submandibular lymphadenopathy with tenderness to palpation of maxillary and frontal sinuses.  ECG stable with  {Document critical care time when appropriate:1} {Document review of labs and clinical decision tools ie heart score, Chads2Vasc2 etc:1}  {Document your independent review of radiology images, and any outside records:1} {Document your discussion with family members, caretakers, and with consultants:1} {Document social determinants of health affecting pt's care:1} {Document your decision making why or why not admission, treatments were needed:1} Final Clinical Impression(s) / ED Diagnoses Final  diagnoses:  None    Rx / DC Orders ED Discharge Orders     None

## 2021-08-11 NOTE — ED Triage Notes (Signed)
C/o cough since Wednesday. Also c/o fever, shortness of breath, nausea, pain under left breast, decreased appetite, sore throat, 5lb weight loss.

## 2021-08-11 NOTE — ED Notes (Signed)
RT ambulated patient with pulse ox. SAT 100%. Coughing intermittently but no distress noted. MD aware. Patient in restroom

## 2021-08-17 ENCOUNTER — Ambulatory Visit: Payer: Medicare HMO | Admitting: Podiatry

## 2021-09-02 ENCOUNTER — Ambulatory Visit (INDEPENDENT_AMBULATORY_CARE_PROVIDER_SITE_OTHER): Payer: No Typology Code available for payment source

## 2021-09-02 VITALS — BP 151/91 | HR 80 | Temp 98.0°F | Resp 16 | Ht 64.0 in | Wt 175.4 lb

## 2021-09-02 DIAGNOSIS — Z0000 Encounter for general adult medical examination without abnormal findings: Secondary | ICD-10-CM

## 2021-09-02 NOTE — Patient Instructions (Signed)
Kristina Patel , Thank you for taking time to come for your Medicare Wellness Visit. I appreciate your ongoing commitment to your health goals. Please review the following plan we discussed and let me know if I can assist you in the future.   Screening recommendations/referrals: Colonoscopy: 07/06/13 due 07/07/23 Mammogram: 03/05/21 due 03/05/22 Bone Density: will send copy Recommended yearly ophthalmology/optometry visit for glaucoma screening and checkup Recommended yearly dental visit for hygiene and checkup  Vaccinations: Influenza vaccine: up to date Pneumococcal vaccine: up to date Tdap vaccine: up to date Shingles vaccine: Due-May obtain vaccine at your local pharmacy.    Covid-19:Due-May obtain vaccine at your local pharmacy.   Advanced directives: yes, not on file  Conditions/risks identified: see problem list   Next appointment: Follow up in one year for your annual wellness visit 09/07/22   Preventive Care 65 Years and Older, Female Preventive care refers to lifestyle choices and visits with your health care provider that can promote health and wellness. What does preventive care include? A yearly physical exam. This is also called an annual well check. Dental exams once or twice a year. Routine eye exams. Ask your health care provider how often you should have your eyes checked. Personal lifestyle choices, including: Daily care of your teeth and gums. Regular physical activity. Eating a healthy diet. Avoiding tobacco and drug use. Limiting alcohol use. Practicing safe sex. Taking low-dose aspirin every day. Taking vitamin and mineral supplements as recommended by your health care provider. What happens during an annual well check? The services and screenings done by your health care provider during your annual well check will depend on your age, overall health, lifestyle risk factors, and family history of disease. Counseling  Your health care provider may ask you  questions about your: Alcohol use. Tobacco use. Drug use. Emotional well-being. Home and relationship well-being. Sexual activity. Eating habits. History of falls. Memory and ability to understand (cognition). Work and work Statistician. Reproductive health. Screening  You may have the following tests or measurements: Height, weight, and BMI. Blood pressure. Lipid and cholesterol levels. These may be checked every 5 years, or more frequently if you are over 61 years old. Skin check. Lung cancer screening. You may have this screening every year starting at age 33 if you have a 30-pack-year history of smoking and currently smoke or have quit within the past 15 years. Fecal occult blood test (FOBT) of the stool. You may have this test every year starting at age 51. Flexible sigmoidoscopy or colonoscopy. You may have a sigmoidoscopy every 5 years or a colonoscopy every 10 years starting at age 65. Hepatitis C blood test. Hepatitis B blood test. Sexually transmitted disease (STD) testing. Diabetes screening. This is done by checking your blood sugar (glucose) after you have not eaten for a while (fasting). You may have this done every 1-3 years. Bone density scan. This is done to screen for osteoporosis. You may have this done starting at age 70. Mammogram. This may be done every 1-2 years. Talk to your health care provider about how often you should have regular mammograms. Talk with your health care provider about your test results, treatment options, and if necessary, the need for more tests. Vaccines  Your health care provider may recommend certain vaccines, such as: Influenza vaccine. This is recommended every year. Tetanus, diphtheria, and acellular pertussis (Tdap, Td) vaccine. You may need a Td booster every 10 years. Zoster vaccine. You may need this after age 75. Pneumococcal 13-valent conjugate (  PCV13) vaccine. One dose is recommended after age 71. Pneumococcal polysaccharide  (PPSV23) vaccine. One dose is recommended after age 54. Talk to your health care provider about which screenings and vaccines you need and how often you need them. This information is not intended to replace advice given to you by your health care provider. Make sure you discuss any questions you have with your health care provider. Document Released: 03/07/2015 Document Revised: 10/29/2015 Document Reviewed: 12/10/2014 Elsevier Interactive Patient Education  2017 [place] Prevention in the Home Falls can cause injuries. They can happen to people of all ages. There are many things you can do to make your home safe and to help prevent falls. What can I do on the outside of my home? Regularly fix the edges of walkways and driveways and fix any cracks. Remove anything that might make you trip as you walk through a door, such as a raised step or threshold. Trim any bushes or trees on the path to your home. Use bright outdoor lighting. Clear any walking paths of anything that might make someone trip, such as rocks or tools. Regularly check to see if handrails are loose or broken. Make sure that both sides of any steps have handrails. Any raised decks and porches should have guardrails on the edges. Have any leaves, snow, or ice cleared regularly. Use sand or salt on walking paths during winter. Clean up any spills in your garage right away. This includes oil or grease spills. What can I do in the bathroom? Use night lights. Install grab bars by the toilet and in the tub and shower. Do not use towel bars as grab bars. Use non-skid mats or decals in the tub or shower. If you need to sit down in the shower, use a plastic, non-slip stool. Keep the floor dry. Clean up any water that spills on the floor as soon as it happens. Remove soap buildup in the tub or shower regularly. Attach bath mats securely with double-sided non-slip rug tape. Do not have throw rugs and other things on the  floor that can make you trip. What can I do in the bedroom? Use night lights. Make sure that you have a light by your bed that is easy to reach. Do not use any sheets or blankets that are too big for your bed. They should not hang down onto the floor. Have a firm chair that has side arms. You can use this for support while you get dressed. Do not have throw rugs and other things on the floor that can make you trip. What can I do in the kitchen? Clean up any spills right away. Avoid walking on wet floors. Keep items that you use a lot in easy-to-reach places. If you need to reach something above you, use a strong step stool that has a grab bar. Keep electrical cords out of the way. Do not use floor polish or wax that makes floors slippery. If you must use wax, use non-skid floor wax. Do not have throw rugs and other things on the floor that can make you trip. What can I do with my stairs? Do not leave any items on the stairs. Make sure that there are handrails on both sides of the stairs and use them. Fix handrails that are broken or loose. Make sure that handrails are as long as the stairways. Check any carpeting to make sure that it is firmly attached to the stairs. Fix any carpet that is  loose or worn. Avoid having throw rugs at the top or bottom of the stairs. If you do have throw rugs, attach them to the floor with carpet tape. Make sure that you have a light switch at the top of the stairs and the bottom of the stairs. If you do not have them, ask someone to add them for you. What else can I do to help prevent falls? Wear shoes that: Do not have high heels. Have rubber bottoms. Are comfortable and fit you well. Are closed at the toe. Do not wear sandals. If you use a stepladder: Make sure that it is fully opened. Do not climb a closed stepladder. Make sure that both sides of the stepladder are locked into place. Ask someone to hold it for you, if possible. Clearly mark and make  sure that you can see: Any grab bars or handrails. First and last steps. Where the edge of each step is. Use tools that help you move around (mobility aids) if they are needed. These include: Canes. Walkers. Scooters. Crutches. Turn on the lights when you go into a dark area. Replace any light bulbs as soon as they burn out. Set up your furniture so you have a clear path. Avoid moving your furniture around. If any of your floors are uneven, fix them. If there are any pets around you, be aware of where they are. Review your medicines with your doctor. Some medicines can make you feel dizzy. This can increase your chance of falling. Ask your doctor what other things that you can do to help prevent falls. This information is not intended to replace advice given to you by your health care provider. Make sure you discuss any questions you have with your health care provider. Document Released: 12/05/2008 Document Revised: 07/17/2015 Document Reviewed: 03/15/2014 Elsevier Interactive Patient Education  2017 [employer].

## 2021-09-02 NOTE — Progress Notes (Addendum)
Subjective:   Kristina Patel is a 76 y.o. female who presents for Medicare Annual (Subsequent) preventive examination.  Review of Systems     Cardiac Risk Factors include: advanced age (>3mn, >>35women);diabetes mellitus;hypertension;obesity (BMI >30kg/m2);dyslipidemia     Objective:    Today's Vitals   09/02/21 1316 09/02/21 1320  BP: (!) 151/91   Pulse: 80   Resp: 16   Temp: 98 F (36.7 C)   SpO2: 99%   Weight: 175 lb 6.4 oz (79.6 kg)   Height: '5\' 4"'$  (1.626 m)   PainSc:  7    Body mass index is 30.11 kg/m.     09/02/2021    1:17 PM 08/11/2021    7:47 AM 08/27/2020    1:03 PM 05/24/2019    5:13 PM 02/26/2019    1:11 PM 02/19/2018    3:51 PM 01/13/2018    3:37 PM  Advanced Directives  Does Patient Have a Medical Advance Directive? Yes Yes Yes No Yes No No  Type of AParamedicof [place]Homest[place]Living will H[place]Living will [place]Living will  HSereno del MarLiving will    Does patient want to make changes to medical advance directive?  No - Patient declined   No - Patient declined    Copy of HWinfallin Chart? No - copy requested No - copy requested No - copy requested  No - copy requested    Would patient like information on creating a medical advance directive?       No - Patient declined    Current Medications (verified) Outpatient Encounter Medications as of 09/02/2021  Medication Sig   acetaminophen (TYLENOL) 325 MG tablet Take 325 mg by mouth every 6 (six) hours as needed for headache (pain).   ACIDOPHILUS LACTOBACILLUS PO Take 2 tablets by mouth 3 (three) times daily with meals.    ALPRAZolam (XANAX) 0.5 MG tablet Take 1 tablet (0.5 mg total) by mouth every 12 (twelve) hours as needed.   ascorbic acid (VITAMIN C) 500 MG tablet Take by mouth.   aspirin-acetaminophen-caffeine (EXCEDRIN MIGRAINE) 250-250-65 MG tablet Take 1 tablet by mouth daily as needed for headache or  migraine.   atorvastatin (LIPITOR) 10 MG tablet Take 10 mg by mouth daily.    baclofen (LIORESAL) 10 MG tablet Take 1 tablet by mouth 3 (three) times daily.   calcium-vitamin D (OSCAL WITH D) 500-200 MG-UNIT TABS tablet Take 1 tablet by mouth every evening.   Carboxymeth-Cellulose-CitricAc (PLENITY) CAPS Take 1 capsule by mouth 2 (two) times daily before lunch and supper.   Certolizumab Pegol (CIMZIA PREFILLED) 2 X 200 MG/ML PSKT Inject into the skin.   Cholecalciferol (D2000 ULTRA STRENGTH) 50 MCG (2000 UT) CAPS Take by mouth.   cyanocobalamin 1000 MCG tablet Take 1,000 mcg by mouth daily.   cyclobenzaprine (FLEXERIL) 10 MG tablet Take by mouth.   denosumab (PROLIA) 60 MG/ML SOSY injection Inject 60 mg into the skin every 6 (six) months. Last injection July 2019   diclofenac Sodium (VOLTAREN) 1 % GEL Apply 2 g topically 4 (four) times daily.   diphenhydrAMINE (BENADRYL) 25 mg capsule Take by mouth.   esomeprazole (NEXIUM) 40 MG capsule Take by mouth.   famotidine (PEPCID) 20 MG tablet Take 20 mg by mouth at bedtime.   fenofibrate (TRICOR) 48 MG tablet Take 1 tablet (48 mg total) by mouth daily.   Flaxseed, Linseed, (FLAXSEED OIL) 1000 MG CAPS Take 1,000 mg by mouth  daily with supper.    glipiZIDE (GLUCOTROL) 5 MG tablet Take 2.5 mg by mouth 2 (two) times daily.   hyoscyamine (LEVSIN SL) 0.125 MG SL tablet Place under the tongue.   leflunomide (ARAVA) 10 MG tablet Take 1 tablet by mouth daily.   lidocaine (LIDODERM) 5 % Place 1 patch onto the skin daily as needed (pain). Remove & Discard patch within 12 hours or as directed by MD   lidocaine (XYLOCAINE) 2 % jelly 1 application as needed.   lidocaine (XYLOCAINE) 5 % ointment Apply 1 application topically as needed.   Magnesium Oxide 420 MG TABS Take 1 tablet by mouth daily.   methocarbamol (ROBAXIN) 500 MG tablet Take 1 tablet (500 mg total) by mouth every 8 (eight) hours as needed for muscle spasms.   metoprolol tartrate (LOPRESSOR) 50 MG  tablet Take by mouth.   montelukast (SINGULAIR) 10 MG tablet Take 1 tablet (10 mg total) by mouth at bedtime.   Multiple Vitamin (MULTIVITAMIN WITH MINERALS) TABS tablet Take 1 tablet by mouth daily with supper.   nystatin (MYCOSTATIN/NYSTOP) powder Apply topically.   ondansetron (ZOFRAN) 8 MG tablet Take by mouth.   OZEMPIC, 1 MG/DOSE, 4 MG/3ML SOPN DIAL AND INJECT UNDER THE SKIN 1 MG WEEKLY   pantoprazole (PROTONIX) 40 MG tablet Take 40 mg by mouth daily.   sertraline (ZOLOFT) 100 MG tablet Take by mouth.   topiramate (TOPAMAX) 25 MG tablet Take 2 tablets (50 mg total) by mouth 2 (two) times daily.   triamcinolone acetonide (TRIESENCE) 40 MG/ML SUSP Inject into the articular space.   triamcinolone cream (KENALOG) 0.1 % Apply 1 application topically 2 (two) times daily.   TURMERIC PO Take 900 mg by mouth 3 (three) times daily with meals.   [DISCONTINUED] ASPIRIN 81 PO Take 81 mg by mouth daily.   [DISCONTINUED] azithromycin (ZITHROMAX) 250 MG tablet Take 1 tablet (250 mg total) by mouth daily. Take first 2 tablets together, then 1 every day until finished.   [DISCONTINUED] bupivacaine (MARCAINE) 0.25 % injection Inject into the skin.   [DISCONTINUED] cycloSPORINE (RESTASIS) 0.05 % ophthalmic emulsion INSTILL 1 DROP IN EACH EYE EVERY 12 HOURS   [DISCONTINUED] fluorometholone (FML) 0.1 % ophthalmic suspension Apply to eye.   [DISCONTINUED] guaiFENesin (ROBITUSSIN) 100 MG/5ML liquid Take 10 mLs by mouth every 6 (six) hours as needed for cough or to loosen phlegm.   [DISCONTINUED] iron sucrose (VENOFER) 20 MG/ML injection Inject into the vein.   [DISCONTINUED] Lifitegrast 5 % SOLN Apply to eye.   [DISCONTINUED] prednisoLONE acetate (PRED FORTE) 1 % ophthalmic suspension 1 drop 2 (two) times daily.   No facility-administered encounter medications on file as of 09/02/2021.    Allergies (verified) Butalbital-asa-caff-codeine [fiorinal-codeine], Darvon, Fiorinal [butalbital-aspirin-caffeine],  Penciclovir, Penicillins, Buprenorphine, Amitriptyline, Ativan [lorazepam], Bactrim [sulfamethoxazole-trimethoprim], Ciprofloxacin hcl, Gabapentin, Lisinopril, Tape, Ambien [zolpidem tartrate], Benzodiazepines, Metoclopramide, Propoxyphene, and Zolpidem tartrate   History: Past Medical History:  Diagnosis Date   Bacterial overgrowth syndrome    Breast mass    Carpal tunnel syndrome    Colon polyps    Degenerative disc disease    Depression    Fall    GERD (gastroesophageal reflux disease)    Gouty arthropathy    Headache    Hyperlipidemia    Hypertension    IBS (irritable bowel syndrome)    constipation   Internal prolapsed hemorrhoids    Iron deficiency anemia    Melanoma ([place])    Osteoarthritis    Osteoporosis    Ovarian cancer ([place])  Pelvic floor dysfunction 05/05/2016   Peripheral neuropathy    Pulmonary nodules    Renal disease    Rheumatoid arthritis(714.0)    Squamous cell carcinoma skin of abdomen    Uterine cancer ([place])    Vitamin D deficiency    Past Surgical History:  Procedure Laterality Date   ABDOMINAL HYSTERECTOMY     partial   ADENOIDECTOMY     ANAL RECTAL MANOMETRY N/A 04/30/2016   Procedure: ANO RECTAL MANOMETRY;  Surgeon: Mauri Pole, MD;  Location: [facility];  Service: Endoscopy;  Laterality: N/A;   APPENDECTOMY     BREAST SURGERY Bilateral    benign fatty tumor removed   CARPAL TUNNEL RELEASE Right    CERVICAL SPINE SURGERY     CHOLECYSTECTOMY     COLONOSCOPY     KNEE ARTHROPLASTY Right    LUMBAR [place], PARTIAL     MELANOMA EXCISION     OPERATIVE HYSTEROSCOPY     TONSILLECTOMY     Family History  Problem Relation Age of Onset   Alzheimer's disease Mother    Colon cancer Mother 69   Lung cancer Mother    Dementia Mother    Melanoma Father    Non-Hodgkin's lymphoma Father    Heart disease Maternal Grandmother    Colon cancer Maternal Grandfather    Colon cancer Paternal Grandmother    Colon cancer  Paternal Grandfather    Social History   Socioeconomic History   Marital status: Married    Spouse name: [name]   Number of children: 2   Years of education: 35   Highest education level: Not on file  Occupational History   Occupation: disabled  Tobacco Use   Smoking status: Never   Smokeless tobacco: Never  Vaping Use   Vaping Use: Never used  Substance and Sexual Activity   Alcohol use: No   Drug use: No   Sexual activity: Not Currently  Other Topics Concern   Not on file  Social History Narrative   Patient is married [name]) and lives with her husband.   Patient has two children.   Patient is retired.   Patient has a college education.   Patient is right handed.   Patient drinks 3-4 cups of coffee and tea daily.   [employer]   Social Determinants of Health   Financial Resource Strain: Low Risk  (08/27/2020)   Overall Financial Resource Strain (CARDIA)    Difficulty of Paying Living Expenses: Not hard at all  Food Insecurity: No Food Insecurity (08/27/2020)   Hunger Vital Sign    Worried About Running Out of Food in the Last Year: Never true    [place] in the Last Year: Never true  Transportation Needs: No Transportation Needs (08/27/2020)   PRAPARE - Hydrologist (Medical): No    Lack of Transportation (Non-Medical): No  Physical Activity: Insufficiently Active (08/27/2020)   Exercise Vital Sign    Days of Exercise per Week: 2 days    Minutes of Exercise per Session: 40 min  Stress: No Stress Concern Present (08/27/2020)   [place]    Feeling of Stress : Not at all  Social Connections: Moderately Isolated (08/27/2020)   Social Connection and Isolation Panel [NHANES]    Frequency of Communication with Friends and Family: More than three times a week    Frequency of Social Gatherings with Friends and  Family: More than three times a week    Attends Religious Services:  Never    Active Member of Clubs or Organizations: No    Attends Music therapist: Never    Marital Status: Married    Tobacco Counseling Counseling given: Not Answered   Clinical Intake:  Pre-visit preparation completed: Yes  Pain : 0-10 Pain Score: 7  Pain Type: Chronic pain Pain Location: Shoulder Pain Orientation: Left Pain Descriptors / Indicators: Aching, Dull, Sore Pain Onset: Yesterday Pain Frequency: Constant     BMI - recorded: 30.11 Nutritional Status: BMI > 30  Obese Nutritional Risks: Unintentional weight loss Diabetes: Yes CBG done?: No Did pt. bring in CBG monitor from home?: No  How often do you need to have someone help you when you read instructions, pamphlets, or other written materials from your doctor or pharmacy?: 1 - Never  Diabetic?yes Nutrition Risk Assessment:  Has the patient had any N/V/D within the last 2 months?  No  Does the patient have any non-healing wounds?  No  Has the patient had any unintentional weight loss or weight gain?  Yes   Diabetes:  Is the patient diabetic?  Yes  If diabetic, was a CBG obtained today?  No  Did the patient bring in their glucometer from home?  No  How often do you monitor your CBG's? Twice a week.   Financial Strains and Diabetes Management:  Are you having any financial strains with the device, your supplies or your medication? No .  Does the patient want to be seen by Chronic Care Management for management of their diabetes?  No  Would the patient like to be referred to a Nutritionist or for Diabetic Management?  No   Diabetic Exams:  Diabetic Eye Exam: Overdue for diabetic eye exam. Pt has been advised about the importance in completing this exam. Patient advised to call and schedule an eye exam. Diabetic Foot Exam: Overdue, Pt has been advised about the importance in completing this exam. Pt is scheduled for diabetic foot exam on not yet.   Interpreter Needed?: No  Information  entered by :: [place] [facility] Daily Living    09/02/2021    1:25 PM  In your present state of health, do you have any difficulty performing the following activities:  Hearing? 1  Vision? 0  Difficulty concentrating or making decisions? 0  Walking or climbing stairs? 0  Dressing or bathing? 0  Doing errands, shopping? 0  Preparing Food and eating ? N  Using the Toilet? N  In the past six months, have you accidently leaked urine? N  Do you have problems with loss of bowel control? N  Managing your Medications? N  Managing your Finances? N  Housekeeping or managing your Housekeeping? N    Patient Care Team: Shelda Pal, DO as PCP - General (Family Medicine)  Indicate any recent Medical Services you may have received from other than [facility] providers in the past year (date may be approximate).     Assessment:   This is a routine wellness examination for [place].  Hearing/Vision screen No results found.  Dietary issues and exercise activities discussed: Current Exercise Habits: Home exercise routine, Type of exercise: walking, Time (Minutes): 30, Frequency (Times/Week): 7, Weekly Exercise (Minutes/Week): 210, Intensity: Mild, Exercise limited by: None identified   Goals Addressed             This Visit's Progress    Patient Stated  On track    Maintain healthy active life         Depression Screen    09/02/2021    1:18 PM 04/28/2021    1:43 PM 08/27/2020    1:06 PM 08/12/2020    1:45 PM 08/12/2020    1:44 PM 02/26/2019    1:16 PM 01/23/2016    7:38 AM  PHQ 2/9 Scores  PHQ - 2 Score 0 0 0 0 0 1 2  PHQ- 9 Score  1  0   8    Fall Risk    09/02/2021    1:18 PM 04/28/2021    1:42 PM 08/27/2020    1:05 PM 08/12/2020    1:44 PM 02/26/2019    1:14 PM  Fall Risk   Falls in the past year? 1 1 0 0 1  Number falls in past yr: 1 1 0 0 1  Injury with Fall? 0 1 0 0 0  Risk for fall due to : No Fall Risks No Fall Risks  No Fall Risks   Follow up Falls  evaluation completed Falls evaluation completed Falls prevention discussed Falls evaluation completed Education provided;Falls prevention discussed    FALL RISK PREVENTION PERTAINING TO THE HOME:  Any stairs in or around the home? No  If so, are there any without handrails?  N/a Home free of loose throw rugs in walkways, pet beds, electrical cords, etc? Yes  Adequate lighting in your home to reduce risk of falls? Yes   ASSISTIVE DEVICES UTILIZED TO PREVENT FALLS:  Life alert? No  Use of a cane, walker or w/c? No  Grab bars in the bathroom? Yes  Shower chair or bench in shower? Yes  Elevated toilet seat or a handicapped toilet? Yes   TIMED UP AND GO:  Was the test performed? Yes .  Length of time to ambulate 10 feet: 12 sec.   Gait steady and fast without use of assistive device  Cognitive Function:        09/02/2021    1:29 PM  6CIT Screen  What Year? 0 points  What month? 0 points  What time? 0 points  Count back from 20 0 points  Months in reverse 0 points  Repeat phrase 0 points  Total Score 0 points    Immunizations Immunization History  Administered Date(s) Administered   Fluad Quad(high Dose 65+) 11/23/2014, 11/23/2018, 12/10/2019   Influenza, High Dose Seasonal PF 01/23/2016, 12/22/2020   Moderna Sars-Covid-2 Vaccination 04/07/2019, 05/05/2019, 11/05/2019, 06/03/2020   Pneumococcal Conjugate-13 09/19/2013   Pneumococcal Polysaccharide-23 05/21/2014, 12/08/2018   Pneumococcal-Unspecified 02/22/2013   Tdap 07/07/2011   Tetanus 02/23/2012    TDAP status: Up to date  Flu Vaccine status: Up to date  Pneumococcal vaccine status: Up to date  Covid-19 vaccine status: Information provided on how to obtain vaccines.   Qualifies for Shingles Vaccine? Yes   Zostavax completed No   Shingrix Completed?: No.    Education has been provided regarding the importance of this vaccine. Patient has been advised to call insurance company to determine out of pocket  expense if they have not yet received this vaccine. Advised may also receive vaccine at local pharmacy or Health Dept. Verbalized acceptance and understanding.  Screening Tests Health Maintenance  Topic Date Due   FOOT EXAM  07/04/2021   OPHTHALMOLOGY EXAM  08/22/2021   COVID-19 Vaccine (5 - Booster for Moderna series) 10/29/2021 (Originally 07/29/2020)   Zoster Vaccines- Shingrix (1 of 2) 10/29/2021 (Originally 10/08/1964)  INFLUENZA VACCINE  09/22/2021   HEMOGLOBIN A1C  10/29/2021   TETANUS/TDAP  02/22/2022   URINE MICROALBUMIN  06/10/2022   MAMMOGRAM  03/06/2023   COLONOSCOPY (Pts 45-81yr Insurance coverage will need to be confirmed)  07/07/2023   Pneumonia Vaccine 75 Years old  Completed   DEXA SCAN  Completed   Hepatitis C Screening  Completed   HPV VACCINES  Aged Out    Health Maintenance  Health Maintenance Due  Topic Date Due   FOOT EXAM  07/04/2021   OPHTHALMOLOGY EXAM  08/22/2021    Colorectal cancer screening: Type of screening: Colonoscopy. Completed 07/06/13. Repeat every 10 years  Mammogram status: Completed 03/05/21. Repeat every year  Bone Density status: Ordered will send copy. Pt provided with contact info and advised to call to schedule appt.  Lung Cancer Screening: (Low Dose CT Chest recommended if Age 75-80years, 30 pack-year currently smoking OR have quit w/in 15years.) does not qualify.   Lung Cancer Screening Referral: n/a  Additional Screening:  Hepatitis C Screening: does qualify; Completed 02/22/02  Vision Screening: Recommended annual ophthalmology exams for early detection of glaucoma and other disorders of the eye. Is the patient up to date with their annual eye exam?  Yes  Who is the provider or what is the name of the office in which the patient attends annual eye exams? Dr. WKathlen ModyIf pt is not established with a provider, would they like to be referred to a provider to establish care? No .   Dental Screening: Recommended annual dental  exams for proper oral hygiene  Community Resource Referral / Chronic Care Management: CRR required this visit?  No   CCM required this visit?  No      Plan:     I have personally reviewed and noted the following in the patient's chart:   Medical and social history Use of alcohol, tobacco or illicit drugs  Current medications and supplements including opioid prescriptions.  Functional ability and status Nutritional status Physical activity Advanced directives List of other physicians Hospitalizations, surgeries, and ER visits in previous 12 months Vitals Screenings to include cognitive, depression, and falls Referrals and appointments  In addition, I have reviewed and discussed with patient certain preventive protocols, quality metrics, and best practice recommendations. A written personalized care plan for preventive services as well as general preventive health recommendations were provided to patient.     SDuard BradyChism, CL[place]  09/02/2021   Nurse Notes: none

## 2021-09-04 ENCOUNTER — Ambulatory Visit: Payer: Medicare HMO | Admitting: Podiatry

## 2021-09-06 ENCOUNTER — Other Ambulatory Visit: Payer: Self-pay | Admitting: Family Medicine

## 2021-09-10 ENCOUNTER — Encounter: Payer: Self-pay | Admitting: Family Medicine

## 2021-09-11 ENCOUNTER — Other Ambulatory Visit: Payer: Self-pay | Admitting: Family Medicine

## 2021-09-11 MED ORDER — TIRZEPATIDE 7.5 MG/0.5ML ~~LOC~~ SOAJ: 7.5000 mg | SUBCUTANEOUS | 2 refills | Status: DC

## 2021-09-16 ENCOUNTER — Encounter: Payer: Self-pay | Admitting: Family Medicine

## 2021-09-16 ENCOUNTER — Telehealth: Payer: Self-pay

## 2021-09-16 NOTE — Telephone Encounter (Signed)
PA initiated via Covermymeds; KEY: [id number]. PA approved.   PA Case: [id number], Status: Approved, Coverage Starts on: 02/22/2021 12:00:00 AM, Coverage Ends on: 02/21/2022 12:00:00 AM. Questions? Contact [phone].

## 2021-09-17 NOTE — Telephone Encounter (Signed)
Receive approval until  02/21/2022.  Pharmacy/patient notified of approval.

## 2021-10-05 DIAGNOSIS — Z9049 Acquired absence of other specified parts of digestive tract: Secondary | ICD-10-CM | POA: Diagnosis not present

## 2021-10-05 DIAGNOSIS — K838 Other specified diseases of biliary tract: Secondary | ICD-10-CM | POA: Diagnosis not present

## 2021-10-08 ENCOUNTER — Other Ambulatory Visit: Payer: Self-pay | Admitting: Family Medicine

## 2021-10-23 DIAGNOSIS — K5289 Other specified noninfective gastroenteritis and colitis: Secondary | ICD-10-CM | POA: Diagnosis not present

## 2021-10-23 DIAGNOSIS — E119 Type 2 diabetes mellitus without complications: Secondary | ICD-10-CM | POA: Diagnosis not present

## 2021-10-23 DIAGNOSIS — D649 Anemia, unspecified: Secondary | ICD-10-CM | POA: Diagnosis not present

## 2021-10-23 DIAGNOSIS — E785 Hyperlipidemia, unspecified: Secondary | ICD-10-CM | POA: Diagnosis not present

## 2021-10-23 DIAGNOSIS — K5900 Constipation, unspecified: Secondary | ICD-10-CM | POA: Diagnosis not present

## 2021-10-23 DIAGNOSIS — N2889 Other specified disorders of kidney and ureter: Secondary | ICD-10-CM | POA: Diagnosis not present

## 2021-10-23 DIAGNOSIS — K5909 Other constipation: Secondary | ICD-10-CM | POA: Diagnosis not present

## 2021-10-23 DIAGNOSIS — M069 Rheumatoid arthritis, unspecified: Secondary | ICD-10-CM | POA: Diagnosis not present

## 2021-10-23 DIAGNOSIS — K219 Gastro-esophageal reflux disease without esophagitis: Secondary | ICD-10-CM | POA: Diagnosis not present

## 2021-10-23 DIAGNOSIS — R000 Tachycardia, unspecified: Secondary | ICD-10-CM | POA: Diagnosis not present

## 2021-10-23 DIAGNOSIS — K644 Residual hemorrhoidal skin tags: Secondary | ICD-10-CM | POA: Diagnosis not present

## 2021-10-23 DIAGNOSIS — K5730 Diverticulosis of large intestine without perforation or abscess without bleeding: Secondary | ICD-10-CM | POA: Diagnosis not present

## 2021-10-24 DIAGNOSIS — K5730 Diverticulosis of large intestine without perforation or abscess without bleeding: Secondary | ICD-10-CM | POA: Diagnosis not present

## 2021-10-24 DIAGNOSIS — N2889 Other specified disorders of kidney and ureter: Secondary | ICD-10-CM | POA: Diagnosis not present

## 2021-11-27 ENCOUNTER — Other Ambulatory Visit: Payer: Self-pay | Admitting: Family Medicine

## 2021-11-27 ENCOUNTER — Encounter: Payer: Self-pay | Admitting: Podiatry

## 2021-11-27 ENCOUNTER — Encounter: Payer: Self-pay | Admitting: Family Medicine

## 2021-11-27 MED ORDER — ALPRAZOLAM 0.5 MG PO TABS
0.5000 mg | ORAL_TABLET | Freq: Two times a day (BID) | ORAL | 5 refills | Status: DC | PRN
Start: 2021-11-27 — End: 2022-05-11

## 2021-11-30 NOTE — Telephone Encounter (Signed)
Can we schedule her a virtual appointment this week to discuss?

## 2021-12-03 ENCOUNTER — Telehealth: Payer: Self-pay | Admitting: Podiatry

## 2021-12-03 NOTE — Telephone Encounter (Signed)
Pt called stating she got an email to call to schedule an appt to discuss [facility] and her foot is still bothering her.  I have her coming in at 1245 '@10'$ .14.2023.

## 2021-12-05 ENCOUNTER — Ambulatory Visit: Payer: Medicare HMO | Admitting: Podiatry

## 2021-12-05 DIAGNOSIS — M778 Other enthesopathies, not elsewhere classified: Secondary | ICD-10-CM

## 2021-12-05 MED ORDER — DEXAMETHASONE SODIUM PHOSPHATE 120 MG/30ML IJ SOLN
2.0000 mg | Freq: Once | INTRAMUSCULAR | Status: AC
  Administered 2021-12-05: 2 mg via INTRA_ARTICULAR

## 2021-12-05 MED ORDER — TRIAMCINOLONE ACETONIDE 10 MG/ML IJ SUSP
2.5000 mg | Freq: Once | INTRAMUSCULAR | Status: AC
  Administered 2021-12-05: 2.5 mg

## 2021-12-05 NOTE — Patient Instructions (Signed)

## 2021-12-05 NOTE — Progress Notes (Signed)
Subjective: Chief Complaint  Patient presents with   Results    Patient reports having continuous pain in the right foot. Patient states nothing has helped with the discomfort. Patient would like to discuss MRI results and the plan moving forward.     76 year old female presents with the above concerns.  She said the area is still tender.  There is one particular spot she feels like there is some sharp going right into the area.  Last injection was not very helpful.  No injuries.  She said that he could not see it but is causing pain.  MRI 11/11/2021 IMPRESSION:  1. Mild flexor tenosynovitis of the 5th toe may contribute to the  patient's symptoms. No other significant abnormalities are seen in  the area of the patient's pain.  2. 1st MTP degenerative changes with a hallux valgus deformity.  3. Postsurgical and degenerative changes as described.   Objective: AAO x3, NAD DP/PT pulses palpable bilaterally, CRT less than 3 seconds There is 1 specific area of tenderness and this is more on the fourth interspace today.  No edema, erythema.  No fluctuation or crepitation.  No open lesions.  No soft tissue mass identified.  No pain with calf compression, swelling, warmth, erythema  Assessment: Capsulitis right foot  Plan: -All treatment options discussed with the patient including all alternatives, risks, complications.  -Reviewed the MRI.  Discussed a repeat steroid injection.  She wants to proceed with this.  I cleaned the skin with alcohol and a mixture of 0.5 cc of Kenalog 10, 0.25 cc of dexamethasone, 0.5 mL of lidocaine plain, 0.5 mL of lidocaine plain was infiltrated directly into the area pain.  She tolerated well.  Postinjection care discussed.  Continue supportive shoes.  [name] DPM  -Patient encouraged to call the office with any questions, concerns, change in symptoms.

## 2021-12-07 DIAGNOSIS — M5136 Other intervertebral disc degeneration, lumbar region: Secondary | ICD-10-CM | POA: Diagnosis not present

## 2021-12-07 DIAGNOSIS — M4807 Spinal stenosis, lumbosacral region: Secondary | ICD-10-CM | POA: Diagnosis not present

## 2021-12-08 DIAGNOSIS — M542 Cervicalgia: Secondary | ICD-10-CM | POA: Diagnosis not present

## 2021-12-08 DIAGNOSIS — M25512 Pain in left shoulder: Secondary | ICD-10-CM | POA: Diagnosis not present

## 2021-12-08 DIAGNOSIS — M6281 Muscle weakness (generalized): Secondary | ICD-10-CM | POA: Diagnosis not present

## 2021-12-09 ENCOUNTER — Ambulatory Visit (INDEPENDENT_AMBULATORY_CARE_PROVIDER_SITE_OTHER): Payer: Medicare HMO | Admitting: Family Medicine

## 2021-12-09 ENCOUNTER — Encounter: Payer: Self-pay | Admitting: Family Medicine

## 2021-12-09 VITALS — BP 132/82 | HR 86 | Temp 98.2°F | Ht 64.0 in | Wt 165.0 lb

## 2021-12-09 DIAGNOSIS — E782 Mixed hyperlipidemia: Secondary | ICD-10-CM | POA: Diagnosis not present

## 2021-12-09 DIAGNOSIS — E1140 Type 2 diabetes mellitus with diabetic neuropathy, unspecified: Secondary | ICD-10-CM | POA: Diagnosis not present

## 2021-12-09 LAB — COMPREHENSIVE METABOLIC PANEL
ALT: 15 U/L (ref 0–35)
AST: 17 U/L (ref 0–37)
Albumin: 4.3 g/dL (ref 3.5–5.2)
Alkaline Phosphatase: 91 U/L (ref 39–117)
BUN: 14 mg/dL (ref 6–23)
CO2: 23 mEq/L (ref 19–32)
Calcium: 9.9 mg/dL (ref 8.4–10.5)
Chloride: 107 mEq/L (ref 96–112)
Creatinine, Ser: 1.1 mg/dL (ref 0.40–1.20)
GFR: 49.27 mL/min — ABNORMAL LOW (ref >60.00)
Glucose, Bld: 76 mg/dL (ref 70–99)
Potassium: 4.7 mEq/L (ref 3.5–5.1)
Sodium: 139 mEq/L (ref 135–145)
Total Bilirubin: 0.3 mg/dL (ref 0.2–1.2)
Total Protein: 7.3 g/dL (ref 6.0–8.3)

## 2021-12-09 LAB — LIPID PANEL
Cholesterol: 199 mg/dL (ref 0–200)
HDL: 50.1 mg/dL (ref >39.00)
NonHDL: 148.98
Total CHOL/HDL Ratio: 4
Triglycerides: 216 mg/dL — ABNORMAL HIGH (ref 0.0–149.0)
VLDL: 43.2 mg/dL — ABNORMAL HIGH (ref 0.0–40.0)

## 2021-12-09 LAB — HEMOGLOBIN A1C: Hgb A1c MFr Bld: 6.1 % (ref 4.6–6.5)

## 2021-12-09 LAB — LDL CHOLESTEROL, DIRECT: Direct LDL: 119 mg/dL

## 2021-12-09 MED ORDER — METFORMIN HCL 500 MG PO TABS: 500.0000 mg | ORAL_TABLET | Freq: Two times a day (BID) | ORAL | 3 refills | Status: DC

## 2021-12-09 NOTE — Progress Notes (Signed)
Subjective:   Chief Complaint  Patient presents with   Follow-up    Kristina Patel is a 76 y.o. female here for follow-up of diabetes.   Kristina Patel does not routinely monitor her sugars.  Patient does not require insulin.   Medications include: Metformin 500 mg bid Diet is OK.  Exercise: none  Hyperlipidemia Patient presents for mixed hyperlipidemia follow up. Currently taking Lipitor 10 mg/d, Tricor  and compliance with treatment thus far has been good. She denies myalgias. Diet/exercise as above.  The patient is not known to have coexisting coronary artery disease. No CP or SOB.   Past Medical History:  Diagnosis Date   Bacterial overgrowth syndrome    Breast mass    Carpal tunnel syndrome    Colon polyps    Degenerative disc disease    Depression    Fall    GERD (gastroesophageal reflux disease)    Gouty arthropathy    Headache    Hyperlipidemia    Hypertension    IBS (irritable bowel syndrome)    constipation   Internal prolapsed hemorrhoids    Iron deficiency anemia    Melanoma ([facility])    Osteoarthritis    Osteoporosis    Ovarian cancer ([facility])    Pelvic floor dysfunction 05/05/2016   Peripheral neuropathy    Pulmonary nodules    Renal disease    Rheumatoid arthritis(714.0)    Squamous cell carcinoma skin of abdomen    Uterine cancer ([facility])    Vitamin D deficiency      Related testing: Retinal exam: Done Pneumovax: done  Objective:  BP 132/82 (BP Location: Left Arm, Patient Position: Sitting, Cuff Size: Normal)   Pulse 86   Temp 98.2 F (36.8 C) (Oral)   Ht '5\' 4"'$  (1.626 m)   Wt 165 lb (74.8 kg)   SpO2 99%   BMI 28.32 kg/m  General:  Well developed, well nourished, in no apparent distress Skin:  Warm, no pallor or diaphoresis Lungs:  CTAB, no access msc use Cardio:  RRR, no bruits, no LE edema Psych: Age appropriate judgment and insight  Assessment:   Type 2 diabetes mellitus with diabetic neuropathy, without long-term current use of insulin  (HCC) - Plan: Comprehensive metabolic panel, Lipid panel, Hemoglobin A1c  Mixed hyperlipidemia   Plan:   Chronic, hopefully stable. Cont metformin 500 mg bid. Counseled on diet and exercise. Chronic, stable. Cont Lipitor 10 mg/d, Tricor 48 mg/d.  F/u in 6 mo pending above. The patient voiced understanding and agreement to the plan.  [place], DO 12/09/21 1:13 PM

## 2021-12-09 NOTE — Patient Instructions (Signed)
Give us 2-3 business days to get the results of your labs back.   Keep the diet clean and stay active.  Aim to do some physical exertion for 150 minutes per week. This is typically divided into 5 days per week, 30 minutes per day. The activity should be enough to get your heart rate up. Anything is better than nothing if you have time constraints.  Let us know if you need anything.  

## 2021-12-10 DIAGNOSIS — M1712 Unilateral primary osteoarthritis, left knee: Secondary | ICD-10-CM | POA: Diagnosis not present

## 2021-12-10 DIAGNOSIS — M19012 Primary osteoarthritis, left shoulder: Secondary | ICD-10-CM | POA: Diagnosis not present

## 2021-12-10 DIAGNOSIS — M7502 Adhesive capsulitis of left shoulder: Secondary | ICD-10-CM | POA: Diagnosis not present

## 2021-12-15 DIAGNOSIS — M6281 Muscle weakness (generalized): Secondary | ICD-10-CM | POA: Diagnosis not present

## 2021-12-15 DIAGNOSIS — M542 Cervicalgia: Secondary | ICD-10-CM | POA: Diagnosis not present

## 2021-12-15 DIAGNOSIS — M25512 Pain in left shoulder: Secondary | ICD-10-CM | POA: Diagnosis not present

## 2021-12-22 DIAGNOSIS — M6281 Muscle weakness (generalized): Secondary | ICD-10-CM | POA: Diagnosis not present

## 2021-12-22 DIAGNOSIS — M542 Cervicalgia: Secondary | ICD-10-CM | POA: Diagnosis not present

## 2021-12-22 DIAGNOSIS — M25512 Pain in left shoulder: Secondary | ICD-10-CM | POA: Diagnosis not present

## 2021-12-24 DIAGNOSIS — M542 Cervicalgia: Secondary | ICD-10-CM | POA: Diagnosis not present

## 2021-12-24 DIAGNOSIS — M25512 Pain in left shoulder: Secondary | ICD-10-CM | POA: Diagnosis not present

## 2021-12-24 DIAGNOSIS — M6281 Muscle weakness (generalized): Secondary | ICD-10-CM | POA: Diagnosis not present

## 2022-01-05 DIAGNOSIS — M25512 Pain in left shoulder: Secondary | ICD-10-CM | POA: Diagnosis not present

## 2022-01-05 DIAGNOSIS — M542 Cervicalgia: Secondary | ICD-10-CM | POA: Diagnosis not present

## 2022-01-05 DIAGNOSIS — M6281 Muscle weakness (generalized): Secondary | ICD-10-CM | POA: Diagnosis not present

## 2022-01-19 DIAGNOSIS — M25512 Pain in left shoulder: Secondary | ICD-10-CM | POA: Diagnosis not present

## 2022-01-19 DIAGNOSIS — M6281 Muscle weakness (generalized): Secondary | ICD-10-CM | POA: Diagnosis not present

## 2022-01-19 DIAGNOSIS — M542 Cervicalgia: Secondary | ICD-10-CM | POA: Diagnosis not present

## 2022-01-21 DIAGNOSIS — M6281 Muscle weakness (generalized): Secondary | ICD-10-CM | POA: Diagnosis not present

## 2022-01-21 DIAGNOSIS — M25512 Pain in left shoulder: Secondary | ICD-10-CM | POA: Diagnosis not present

## 2022-01-21 DIAGNOSIS — M542 Cervicalgia: Secondary | ICD-10-CM | POA: Diagnosis not present

## 2022-01-28 ENCOUNTER — Encounter: Payer: Self-pay | Admitting: Pharmacist

## 2022-01-28 ENCOUNTER — Telehealth: Payer: Self-pay | Admitting: Pharmacist

## 2022-01-28 DIAGNOSIS — M6281 Muscle weakness (generalized): Secondary | ICD-10-CM | POA: Diagnosis not present

## 2022-01-28 DIAGNOSIS — M542 Cervicalgia: Secondary | ICD-10-CM | POA: Diagnosis not present

## 2022-01-28 DIAGNOSIS — M25512 Pain in left shoulder: Secondary | ICD-10-CM | POA: Diagnosis not present

## 2022-01-28 NOTE — Telephone Encounter (Signed)
Patient was on SUPD list as having type 2 DM but not taking statin. She has atorvastatin on her med list but looks like she needs an updated RX.  Called patient and she reports she is no longer taking atorvastatin. She was not sure why but thought it was because she has a cough with atorvastatin.  Looks like she had a cough with lisinopril.  At patient request sent her a MyChart message about current medications on her list for cholesterol and allergies. Also provided last lipids results and goal.  Patient to get back with me about sending in Rx for statin.

## 2022-02-02 DIAGNOSIS — K219 Gastro-esophageal reflux disease without esophagitis: Secondary | ICD-10-CM | POA: Diagnosis not present

## 2022-02-02 DIAGNOSIS — G43909 Migraine, unspecified, not intractable, without status migrainosus: Secondary | ICD-10-CM | POA: Diagnosis not present

## 2022-02-02 DIAGNOSIS — M109 Gout, unspecified: Secondary | ICD-10-CM | POA: Diagnosis not present

## 2022-02-02 DIAGNOSIS — F32A Depression, unspecified: Secondary | ICD-10-CM | POA: Diagnosis not present

## 2022-02-02 DIAGNOSIS — M4726 Other spondylosis with radiculopathy, lumbar region: Secondary | ICD-10-CM | POA: Diagnosis not present

## 2022-02-02 DIAGNOSIS — F411 Generalized anxiety disorder: Secondary | ICD-10-CM | POA: Diagnosis not present

## 2022-02-02 DIAGNOSIS — E785 Hyperlipidemia, unspecified: Secondary | ICD-10-CM | POA: Diagnosis not present

## 2022-02-02 DIAGNOSIS — I10 Essential (primary) hypertension: Secondary | ICD-10-CM | POA: Diagnosis not present

## 2022-02-02 DIAGNOSIS — G620 Drug-induced polyneuropathy: Secondary | ICD-10-CM | POA: Diagnosis not present

## 2022-02-02 NOTE — Telephone Encounter (Signed)
See notes from [employer].

## 2022-02-08 DIAGNOSIS — M542 Cervicalgia: Secondary | ICD-10-CM | POA: Diagnosis not present

## 2022-02-08 DIAGNOSIS — M25512 Pain in left shoulder: Secondary | ICD-10-CM | POA: Diagnosis not present

## 2022-02-08 DIAGNOSIS — M6281 Muscle weakness (generalized): Secondary | ICD-10-CM | POA: Diagnosis not present

## 2022-02-11 NOTE — Telephone Encounter (Signed)
Error

## 2022-02-16 DIAGNOSIS — M542 Cervicalgia: Secondary | ICD-10-CM | POA: Diagnosis not present

## 2022-02-16 DIAGNOSIS — M25512 Pain in left shoulder: Secondary | ICD-10-CM | POA: Diagnosis not present

## 2022-02-16 DIAGNOSIS — M6281 Muscle weakness (generalized): Secondary | ICD-10-CM | POA: Diagnosis not present

## 2022-02-18 DIAGNOSIS — M25512 Pain in left shoulder: Secondary | ICD-10-CM | POA: Diagnosis not present

## 2022-02-18 DIAGNOSIS — M542 Cervicalgia: Secondary | ICD-10-CM | POA: Diagnosis not present

## 2022-02-18 DIAGNOSIS — M6281 Muscle weakness (generalized): Secondary | ICD-10-CM | POA: Diagnosis not present

## 2022-03-22 ENCOUNTER — Encounter: Payer: Self-pay | Admitting: Family Medicine

## 2022-03-22 ENCOUNTER — Telehealth (INDEPENDENT_AMBULATORY_CARE_PROVIDER_SITE_OTHER): Payer: Medicare HMO | Admitting: Family Medicine

## 2022-03-22 DIAGNOSIS — U071 COVID-19: Secondary | ICD-10-CM

## 2022-03-22 MED ORDER — NIRMATRELVIR/RITONAVIR (PAXLOVID) TABLET (RENAL DOSING): 2.0000 | ORAL_TABLET | Freq: Two times a day (BID) | ORAL | 0 refills | Status: AC

## 2022-03-22 MED ORDER — ONDANSETRON 4 MG PO TBDP: 4.0000 mg | ORAL_TABLET | Freq: Three times a day (TID) | ORAL | 0 refills | Status: DC | PRN

## 2022-03-22 MED ORDER — PROMETHAZINE-DM 6.25-15 MG/5ML PO SYRP: 5.0000 mL | ORAL_SOLUTION | Freq: Four times a day (QID) | ORAL | 0 refills | Status: DC | PRN

## 2022-03-22 NOTE — Progress Notes (Signed)
Chief Complaint  Patient presents with   Covid Positive    Kristina Patel here for URI complaints. Due to COVID-19 pandemic, we are interacting via web portal for an electronic face-to-face visit. I verified patient's ID using 2 identifiers. Patient agreed to proceed with visit via this method. Patient is at home, I am at office. Patient, spouse, and I are present for visit.   Duration: 3 days  Associated symptoms: sinus headache Fever (99 F), sinus headache, rhinorrhea, sore throat, myalgia, and N/D Denies: Ear drainage, sinus congestion, no vomiting Treatment to date: Various OTC medications Sick contacts: No Tested + for covid 3 d ago.   Past Medical History:  Diagnosis Date   Bacterial overgrowth syndrome    Breast mass    Carpal tunnel syndrome    Colon polyps    Degenerative disc disease    Depression    Fall    GERD (gastroesophageal reflux disease)    Gouty arthropathy    Headache    Hyperlipidemia    Hypertension    IBS (irritable bowel syndrome)    constipation   Internal prolapsed hemorrhoids    Iron deficiency anemia    Melanoma (HCC)    Osteoarthritis    Osteoporosis    Ovarian cancer ([place])    Pelvic floor dysfunction 05/05/2016   Peripheral neuropathy    Pulmonary nodules    Renal disease    Rheumatoid arthritis(714.0)    Squamous cell carcinoma skin of abdomen    Uterine cancer (HCC)    Vitamin D deficiency     Objective No conversational dyspnea Age appropriate judgment and insight Nml affect and mood  COVID-19 - Plan: nirmatrelvir/ritonavir, renal dosing, (PAXLOVID) 10 x 150 MG & 10 x '100MG'$  TABS, ondansetron (ZOFRAN-ODT) 4 MG disintegrating tablet, promethazine-dextromethorphan (PROMETHAZINE-DM) 6.25-15 MG/5ML syrup  Renal dosing for Paxlovid provided.  She will hold her Lipitor for the next 8 days.  Zofran as needed.  Phenergan syrup as needed.  Warnings about drowsiness with the syrup provided.  Continue to push fluids, practice good hand  hygiene, cover mouth when coughing. Discussed CDC quarantining guidelines.  F/u prn. If starting to experience irreplaceable fluid loss, shaking, or shortness of breath, seek immediate care. Pt voiced understanding and agreement to the plan.  [place], DO 03/22/22 4:38 PM

## 2022-04-05 ENCOUNTER — Encounter: Payer: Self-pay | Admitting: Family Medicine

## 2022-04-13 DIAGNOSIS — Z5181 Encounter for therapeutic drug level monitoring: Secondary | ICD-10-CM | POA: Diagnosis not present

## 2022-04-13 DIAGNOSIS — Z1330 Encounter for screening examination for mental health and behavioral disorders, unspecified: Secondary | ICD-10-CM | POA: Diagnosis not present

## 2022-04-13 DIAGNOSIS — Z89421 Acquired absence of other right toe(s): Secondary | ICD-10-CM | POA: Diagnosis not present

## 2022-04-13 DIAGNOSIS — M5030 Other cervical disc degeneration, unspecified cervical region: Secondary | ICD-10-CM | POA: Diagnosis not present

## 2022-04-13 DIAGNOSIS — E1140 Type 2 diabetes mellitus with diabetic neuropathy, unspecified: Secondary | ICD-10-CM | POA: Diagnosis not present

## 2022-04-13 DIAGNOSIS — Z79891 Long term (current) use of opiate analgesic: Secondary | ICD-10-CM | POA: Diagnosis not present

## 2022-04-13 DIAGNOSIS — G894 Chronic pain syndrome: Secondary | ICD-10-CM | POA: Diagnosis not present

## 2022-04-19 DIAGNOSIS — M19012 Primary osteoarthritis, left shoulder: Secondary | ICD-10-CM | POA: Diagnosis not present

## 2022-04-26 ENCOUNTER — Ambulatory Visit: Payer: No Typology Code available for payment source | Admitting: Family Medicine

## 2022-05-04 ENCOUNTER — Ambulatory Visit: Payer: Medicare HMO | Admitting: Podiatry

## 2022-05-11 ENCOUNTER — Other Ambulatory Visit: Payer: Self-pay | Admitting: Family Medicine

## 2022-05-11 MED ORDER — ALPRAZOLAM 0.5 MG PO TABS: 0.5000 mg | ORAL_TABLET | Freq: Two times a day (BID) | ORAL | 0 refills | Status: DC | PRN

## 2022-05-11 NOTE — Telephone Encounter (Signed)
Called left message to call back 

## 2022-05-11 NOTE — Telephone Encounter (Signed)
Last OV--03/22/2022 Last RF--11/27/2021--#60 with 5 Refills UDS-06/09/2021 CSC-04/28/2021

## 2022-05-17 ENCOUNTER — Other Ambulatory Visit: Payer: Self-pay | Admitting: Family Medicine

## 2022-05-31 ENCOUNTER — Other Ambulatory Visit: Payer: Self-pay | Admitting: Family Medicine

## 2022-05-31 ENCOUNTER — Encounter: Payer: Self-pay | Admitting: Family Medicine

## 2022-05-31 MED ORDER — FAMOTIDINE 20 MG PO TABS: 20.0000 mg | ORAL_TABLET | Freq: Every day | ORAL | 2 refills | Status: DC

## 2022-05-31 MED ORDER — CYCLOBENZAPRINE HCL 10 MG PO TABS: 10.0000 mg | ORAL_TABLET | Freq: Three times a day (TID) | ORAL | 5 refills | Status: DC | PRN

## 2022-05-31 MED ORDER — ALPRAZOLAM 0.5 MG PO TABS: 0.5000 mg | ORAL_TABLET | Freq: Two times a day (BID) | ORAL | 5 refills | Status: DC | PRN

## 2022-06-01 DIAGNOSIS — M5030 Other cervical disc degeneration, unspecified cervical region: Secondary | ICD-10-CM | POA: Diagnosis not present

## 2022-06-01 DIAGNOSIS — M4802 Spinal stenosis, cervical region: Secondary | ICD-10-CM | POA: Diagnosis not present

## 2022-06-01 DIAGNOSIS — Z79899 Other long term (current) drug therapy: Secondary | ICD-10-CM | POA: Diagnosis not present

## 2022-06-01 DIAGNOSIS — Z885 Allergy status to narcotic agent status: Secondary | ICD-10-CM | POA: Diagnosis not present

## 2022-06-01 DIAGNOSIS — G8929 Other chronic pain: Secondary | ICD-10-CM | POA: Diagnosis not present

## 2022-06-01 DIAGNOSIS — Z880 Allergy status to penicillin: Secondary | ICD-10-CM | POA: Diagnosis not present

## 2022-06-01 DIAGNOSIS — M5010 Cervical disc disorder with radiculopathy, unspecified cervical region: Secondary | ICD-10-CM | POA: Diagnosis not present

## 2022-06-01 DIAGNOSIS — M5412 Radiculopathy, cervical region: Secondary | ICD-10-CM | POA: Diagnosis not present

## 2022-06-01 DIAGNOSIS — M4722 Other spondylosis with radiculopathy, cervical region: Secondary | ICD-10-CM | POA: Diagnosis not present

## 2022-06-01 DIAGNOSIS — Z888 Allergy status to other drugs, medicaments and biological substances status: Secondary | ICD-10-CM | POA: Diagnosis not present

## 2022-06-03 ENCOUNTER — Other Ambulatory Visit: Payer: Self-pay | Admitting: Family Medicine

## 2022-06-03 MED ORDER — OZEMPIC (0.25 OR 0.5 MG/DOSE) 2 MG/3ML ~~LOC~~ SOPN: 0.5000 mg | PEN_INJECTOR | SUBCUTANEOUS | 5 refills | Status: DC

## 2022-06-28 DIAGNOSIS — R053 Chronic cough: Secondary | ICD-10-CM | POA: Diagnosis not present

## 2022-06-28 DIAGNOSIS — K219 Gastro-esophageal reflux disease without esophagitis: Secondary | ICD-10-CM | POA: Diagnosis not present

## 2022-07-14 DIAGNOSIS — G894 Chronic pain syndrome: Secondary | ICD-10-CM | POA: Diagnosis not present

## 2022-07-14 DIAGNOSIS — Z5181 Encounter for therapeutic drug level monitoring: Secondary | ICD-10-CM | POA: Diagnosis not present

## 2022-07-14 DIAGNOSIS — M47812 Spondylosis without myelopathy or radiculopathy, cervical region: Secondary | ICD-10-CM | POA: Diagnosis not present

## 2022-07-14 DIAGNOSIS — Z79891 Long term (current) use of opiate analgesic: Secondary | ICD-10-CM | POA: Diagnosis not present

## 2022-07-22 DIAGNOSIS — Z96651 Presence of right artificial knee joint: Secondary | ICD-10-CM | POA: Diagnosis not present

## 2022-07-22 DIAGNOSIS — M7051 Other bursitis of knee, right knee: Secondary | ICD-10-CM | POA: Diagnosis not present

## 2022-07-25 DIAGNOSIS — Z96651 Presence of right artificial knee joint: Secondary | ICD-10-CM | POA: Diagnosis not present

## 2022-07-25 DIAGNOSIS — M7051 Other bursitis of knee, right knee: Secondary | ICD-10-CM | POA: Diagnosis not present

## 2022-08-10 DIAGNOSIS — Z79620 Long term (current) use of immunosuppressive biologic: Secondary | ICD-10-CM | POA: Diagnosis not present

## 2022-08-10 DIAGNOSIS — Z880 Allergy status to penicillin: Secondary | ICD-10-CM | POA: Diagnosis not present

## 2022-08-10 DIAGNOSIS — K5903 Drug induced constipation: Secondary | ICD-10-CM | POA: Diagnosis not present

## 2022-08-10 DIAGNOSIS — Z888 Allergy status to other drugs, medicaments and biological substances status: Secondary | ICD-10-CM | POA: Diagnosis not present

## 2022-08-10 DIAGNOSIS — G894 Chronic pain syndrome: Secondary | ICD-10-CM | POA: Diagnosis not present

## 2022-08-10 DIAGNOSIS — M5134 Other intervertebral disc degeneration, thoracic region: Secondary | ICD-10-CM | POA: Diagnosis not present

## 2022-08-10 DIAGNOSIS — M47812 Spondylosis without myelopathy or radiculopathy, cervical region: Secondary | ICD-10-CM | POA: Diagnosis not present

## 2022-08-10 DIAGNOSIS — M47816 Spondylosis without myelopathy or radiculopathy, lumbar region: Secondary | ICD-10-CM | POA: Diagnosis not present

## 2022-08-10 DIAGNOSIS — Z79899 Other long term (current) drug therapy: Secondary | ICD-10-CM | POA: Diagnosis not present

## 2022-08-20 ENCOUNTER — Telehealth: Payer: Self-pay | Admitting: Pharmacy Technician

## 2022-08-20 DIAGNOSIS — Z5986 Financial insecurity: Secondary | ICD-10-CM

## 2022-08-20 NOTE — Progress Notes (Signed)
[facility] Customer service manager [facility]) Quality Pharmacy Team [facility]   08/20/2022  LAKESA HARKLESS 1945/08/05 [id number]  Reason for referral: Medication Assistance  Referral source:  [facility] CPhT Dylan Burnette from [facility] MAD rport Current insurance: [employer]  Reason for call: prescreen patient for [employer] patient assistance foundation  Outreach:  Unsuccessful telephone call attempt #1 to patient.   HIPAA compliant voicemail left requesting a return call  Plan:  I will make another outreach attempt.  Thank you for allowing [facility] pharmacy to be a part of this patient's care.   [name], CPhT [facility] Office: [phone] Fax: [phone] Email: [email or web address]

## 2022-08-24 ENCOUNTER — Telehealth: Payer: Self-pay | Admitting: Pharmacy Technician

## 2022-08-24 DIAGNOSIS — Z5986 Financial insecurity: Secondary | ICD-10-CM

## 2022-08-24 NOTE — Progress Notes (Signed)
[facility] Customer service manager [facility]) Quality Pharmacy Team [facility]   08/24/2022  KERSTAN BUELOW 1945-03-25 [id number]  Reason for referral: Medication Assistance for Ozempic  Referral source: [facility] CPhT Richardson Dopp from [facility] MAD report   Current insurance: [employer]  Reason for call: prescreen patient for [employer] patient assistance foundation   Outreach:  Unsuccessful telephone call attempt #2 to patient.   HIPAA compliant voicemail left requesting a return call  Plan:  I will make another outreach attempt.  Thank you for allowing [facility] pharmacy to be a part of this patient's care.   [name], CPhT [place]  [facility] Office: [phone] Fax: [phone] Email: [email or web address]

## 2022-09-01 ENCOUNTER — Telehealth: Payer: Self-pay | Admitting: Pharmacy Technician

## 2022-09-01 DIAGNOSIS — Z5986 Financial insecurity: Secondary | ICD-10-CM

## 2022-09-01 NOTE — Progress Notes (Signed)
[facility] Customer service manager [facility]) Quality Pharmacy Team [facility]   09/01/2022  TENILLE MORRILL 1945/10/21 [id number]  Reason for referral: Medication Assistance for Ozempic  Referral source: [facility] CPhT Richardson Dopp from [facility] MAD report  Current insurance: [employer]  Reason for call: prescreen patient for [employer] patient assistance foundation   Outreach:  Unsuccessful telephone call attempt #3 to patient.   HIPAA compliant voicemail left requesting a return call  Plan:  -I will close [facility] pharmacy case at this time as I have been unable to establish and/or maintain contact with patient.  Thank you for allowing [facility] pharmacy to be a part of this patient's care.   [name], CPhT [place]  [facility] Office: [phone] Fax: [phone] Email: [email or web address]

## 2022-09-02 ENCOUNTER — Other Ambulatory Visit: Payer: Self-pay | Admitting: Family Medicine

## 2022-09-02 DIAGNOSIS — U071 COVID-19: Secondary | ICD-10-CM

## 2022-09-07 ENCOUNTER — Telehealth: Payer: Self-pay | Admitting: Pharmacy Technician

## 2022-09-07 DIAGNOSIS — Z5986 Financial insecurity: Secondary | ICD-10-CM

## 2022-09-07 NOTE — Progress Notes (Signed)
[facility] Customer service manager [facility]) Quality Pharmacy Team [facility]   09/07/2022  Kristina Patel [birth year] [id number]  Reason for referral: Medication Assistance for Ozempic  Referral source: [facility] CPhT Richardson Dopp from [facility] MAD report  Current insurance: [employer]  Reason for call: prescreen patient for [employer] patient assistance program  Outreach:  Unsuccessful telephone call attempt #4 to patient.   Unable to leave message as phone will ring several times and then hang up.  Plan:  MYChart message sent to patient requesting patient return the call -I am happy to assist in the future as needed.  Thank you for allowing [facility] pharmacy to be a part of this patient's care.   [name], CPhT [place]  [facility] Office: [phone] Fax: [phone] Email: [name]

## 2022-09-08 ENCOUNTER — Telehealth: Payer: Self-pay | Admitting: Pharmacy Technician

## 2022-09-08 DIAGNOSIS — Z5986 Financial insecurity: Secondary | ICD-10-CM

## 2022-09-08 NOTE — Progress Notes (Addendum)
[facility])  [facility] Quality Pharmacy Team   09/08/2022  Kristina Patel Mar 17, 1945 [id number]  Reason for referral: Medication assistance  Referral source: [facility] CPhT Richardson Dopp from [facility] MAD report  Current insurance: [employer]  Outreach:  Successful telephone call with patient.  HIPAA identifiers verified. Patient informs she is a household of 2, does not file taxes and income is from [employer]. Based on reported income, patient appears to meet the guidelines of [employer] patient assistance program. She informs she has [facility] and the prescriber is Dr. Arva Chafe. Will mail patient an application.  Medication Assistance Findings:  Medication assistance needs identified: Ozempic  Extra Help:  Not eligible for Extra Help Low Income Subsidy based on reported income and assets  Additional medication assistance options reviewed with patient as warranted:  No other options identified  Plan: I will route patient assistance letter to [facility] pharmacy technician who will coordinate patient assistance program application process for medications listed above.  [facility] pharmacy technician will assist with obtaining all required documents from both patient and provider(s) and submit application(s) once completed.  Thank you for allowing [facility] pharmacy to be a part of this patient's care.   [name], CPhT [place]  [facility] Office: [phone] Fax: [phone] Email: [email or web address]

## 2022-09-09 ENCOUNTER — Telehealth: Payer: Self-pay | Admitting: Pharmacy Technician

## 2022-09-09 DIAGNOSIS — Z5986 Financial insecurity: Secondary | ICD-10-CM

## 2022-09-09 NOTE — Progress Notes (Signed)
[facility] Customer service manager [facility])                                            [facility] Quality Pharmacy Team    09/09/2022  OTTIE NEGLIA 03-17-1945 [id number]                                      Medication Assistance Referral  Referral From: [facility] CPhT Richardson Dopp from [facility] MAD report   Medication/Company: Franki Monte / [employer] Patient application portion:  Mining engineer portion: Faxed  to Dr. Kathyrn Sheriff Provider address/fax verified via: Office website   [name], CPhT [place]  [facility] Office: [phone] Fax: [phone] Email: [email or web address]

## 2022-09-14 ENCOUNTER — Encounter (HOSPITAL_BASED_OUTPATIENT_CLINIC_OR_DEPARTMENT_OTHER): Payer: Self-pay | Admitting: Emergency Medicine

## 2022-09-14 ENCOUNTER — Emergency Department (HOSPITAL_BASED_OUTPATIENT_CLINIC_OR_DEPARTMENT_OTHER)
Admission: EM | Admit: 2022-09-14 | Discharge: 2022-09-14 | Disposition: A | Discharge status: Home / Self Care | Payer: No Typology Code available for payment source | Attending: Emergency Medicine | Admitting: Emergency Medicine

## 2022-09-14 ENCOUNTER — Emergency Department (HOSPITAL_BASED_OUTPATIENT_CLINIC_OR_DEPARTMENT_OTHER): Payer: No Typology Code available for payment source

## 2022-09-14 ENCOUNTER — Other Ambulatory Visit: Payer: Self-pay

## 2022-09-14 DIAGNOSIS — Z7984 Long term (current) use of oral hypoglycemic drugs: Secondary | ICD-10-CM | POA: Diagnosis not present

## 2022-09-14 DIAGNOSIS — M25512 Pain in left shoulder: Secondary | ICD-10-CM | POA: Insufficient documentation

## 2022-09-14 DIAGNOSIS — Z79899 Other long term (current) drug therapy: Secondary | ICD-10-CM | POA: Insufficient documentation

## 2022-09-14 DIAGNOSIS — Z859 Personal history of malignant neoplasm, unspecified: Secondary | ICD-10-CM | POA: Diagnosis not present

## 2022-09-14 MED ORDER — OXYCODONE-ACETAMINOPHEN 5-325 MG PO TABS: 1.0000 | ORAL_TABLET | Freq: Four times a day (QID) | ORAL | 0 refills | Status: DC | PRN

## 2022-09-14 MED ORDER — OXYCODONE-ACETAMINOPHEN 5-325 MG PO TABS
1.0000 | ORAL_TABLET | Freq: Once | ORAL | Status: AC
  Administered 2022-09-14: 1 via ORAL
  Filled 2022-09-14: qty 1

## 2022-09-14 NOTE — ED Triage Notes (Signed)
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 Pt has been getting steroid shots and doing PT but pain worsened Saturday. Last shot to shoulder was end of February.

## 2022-09-14 NOTE — ED Provider Notes (Signed)
[facility] Provider Note   CSN: [id number] Arrival date & time: 09/14/22  1316     History {Add pertinent medical, surgical, social history, OB history to HPI:1} Chief Complaint  Patient presents with   Shoulder Pain    Kristina Patel is a 77 y.o. female.  She has had chronic left shoulder pain for 3 years, follows at [facility] and with an orthopedist in [facility].  Has had injections for torn rotator cuff.  Last 1 was over 2 months ago.  For the last week has had more intense pain around the shoulder increased with any type of movement.  No numbness.  Was at the [place] today and they recommended she come to the emergency department for further evaluation.  She has been taking Tylenol and ibuprofen without improvement.  She has prior been on narcotics for other rheumatoid and cancer pain.  She did have a fall a week ago.  The history is provided by the patient and the spouse.  Shoulder Pain Location:  Shoulder Shoulder location:  L shoulder Injury: yes   Time since incident:  1 week Mechanism of injury: fall   Pain details:    Quality:  Shooting, sharp and tearing   Severity:  Severe   Onset quality:  Gradual   Duration:  1 week   Timing:  Constant   Progression:  Unchanged Handedness:  Right-handed Dislocation: no   Relieved by:  Nothing Worsened by:  Movement Ineffective treatments:  Acetaminophen and NSAIDs Associated symptoms: decreased range of motion and neck pain   Associated symptoms: no fever, no numbness and no tingling        Home Medications Prior to Admission medications   Medication Sig Start Date End Date Taking? Authorizing Provider  acetaminophen (TYLENOL) 325 MG tablet Take 325 mg by mouth every 6 (six) hours as needed for headache (pain).    [provider]  ACIDOPHILUS LACTOBACILLUS PO Take 2 tablets by mouth 3 (three) times daily with meals.     [provider]  ALPRAZolam Prudy Feeler) 0.5 MG  tablet Take 1 tablet (0.5 mg total) by mouth every 12 (twelve) hours as needed. 05/31/22   Sharlene Dory, DO  ascorbic acid (VITAMIN C) 500 MG tablet Take by mouth.    [provider]  aspirin-acetaminophen-caffeine (EXCEDRIN MIGRAINE) [id number] MG tablet Take 1 tablet by mouth daily as needed for headache or migraine.    [provider]  atorvastatin (LIPITOR) 10 MG tablet Take 10 mg by mouth daily.     [provider]  baclofen (LIORESAL) 10 MG tablet Take 1 tablet by mouth 3 (three) times daily. 05/11/19   [provider]  calcium-vitamin D (OSCAL WITH D) 500-200 MG-UNIT TABS tablet Take 1 tablet by mouth every evening.    [provider]  Carboxymeth-Cellulose-CitricAc (PLENITY) CAPS Take 1 capsule by mouth 2 (two) times daily before lunch and supper. 08/13/20   Sharlene Dory, DO  Certolizumab Pegol (CIMZIA PREFILLED) 2 X 200 MG/ML PSKT Inject into the skin. 03/24/20   [provider]  Cholecalciferol (D2000 ULTRA STRENGTH) 50 MCG (2000 UT) CAPS Take by mouth.    [provider]  cyanocobalamin 1000 MCG tablet Take 1,000 mcg by mouth daily.    [provider]  cyclobenzaprine (FLEXERIL) 10 MG tablet Take 1 tablet (10 mg total) by mouth 3 (three) times daily as needed for muscle spasms. 05/31/22   Sharlene Dory, DO  denosumab (PROLIA) 60 MG/ML SOSY injection Inject 60 mg into the skin every 6 (six) months. Last injection July 2019    [provider]  diclofenac Sodium (VOLTAREN) 1 % GEL Apply 2 g topically 4 (four) times daily. 01/09/19   Vivi Barrack, DPM  diphenhydrAMINE (BENADRYL) 25 mg capsule Take by mouth.    [provider]  esomeprazole (NEXIUM) 40 MG capsule Take by mouth.    [provider]  famotidine (PEPCID) 20 MG tablet Take 1 tablet (20 mg total) by mouth at bedtime. 05/31/22   Sharlene Dory, DO  fenofibrate (TRICOR) 48 MG tablet TAKE ONE TABLET BY  MOUTH DAILY 09/07/21   Wendling, Jilda Roche, DO  Flaxseed, Linseed, (FLAXSEED OIL) 1000 MG CAPS Take 1,000 mg by mouth daily with supper.     [provider]  leflunomide (ARAVA) 10 MG tablet Take 1 tablet by mouth daily. 02/27/19   [provider]  lidocaine (XYLOCAINE) 2 % jelly 1 application as needed.    [provider]  Magnesium Oxide 420 MG TABS Take 1 tablet by mouth daily. 04/17/19   [provider]  metFORMIN (GLUCOPHAGE) 500 MG tablet Take 1 tablet (500 mg total) by mouth 2 (two) times daily with a meal. 12/09/21   Wendling, Jilda Roche, DO  metoprolol tartrate (LOPRESSOR) 50 MG tablet Take by mouth.    [provider]  montelukast (SINGULAIR) 10 MG tablet Take 1 tablet (10 mg total) by mouth at bedtime. 12/06/18   Sharlene Dory, DO  Multiple Vitamin (MULTIVITAMIN WITH MINERALS) TABS tablet Take 1 tablet by mouth daily with supper.    [provider]  nystatin (MYCOSTATIN/NYSTOP) powder Apply topically. 12/12/19   [provider]  ondansetron (ZOFRAN-ODT) 4 MG disintegrating tablet PLACE 1 TABLET BY MOUTH EVERY 8 HOURS AS NEEDED FOR NAUSEA AND/OR VOMITING 09/02/22   Sharlene Dory, DO  pantoprazole (PROTONIX) 40 MG tablet Take 40 mg by mouth daily.    [provider]  Semaglutide,0.25 or 0.5MG /DOS, (OZEMPIC, 0.25 OR 0.5 MG/DOSE,) 2 MG/3ML SOPN Inject 0.5 mg into the skin once a week. 06/03/22   Sharlene Dory, DO  sertraline (ZOLOFT) 100 MG tablet Take by mouth. 02/01/20   [provider]  topiramate (TOPAMAX) 25 MG tablet Take 2 tablets (50 mg total) by mouth 2 (two) times daily. 09/23/15   Micki Riley, MD  triamcinolone acetonide (TRIESENCE) 40 MG/ML SUSP Inject into the articular space. 01/28/20   [provider]  triamcinolone cream (KENALOG) 0.1 % Apply 1 application topically 2 (two) times daily. 03/31/21   Vivi Barrack, DPM  TURMERIC PO Take 900 mg by mouth 3  (three) times daily with meals.    [provider]      Allergies    Butalbital-asa-caff-codeine [fiorinal-codeine], Darvon, Fiorinal [butalbital-aspirin-caffeine], Penciclovir, Penicillins, Buprenorphine, Amitriptyline, Ativan [lorazepam], Bactrim [sulfamethoxazole-trimethoprim], Ciprofloxacin hcl, Gabapentin, Lisinopril, Tape, Ambien [zolpidem tartrate], Benzodiazepines, Metoclopramide, Propoxyphene, and Zolpidem tartrate    Review of Systems   Review of Systems  Constitutional:  Negative for fever.  Respiratory:  Negative for shortness of breath.   Cardiovascular:  Negative for chest pain.  Musculoskeletal:  Positive for neck pain.  Neurological:  Negative for weakness and numbness.    Physical Exam Updated Vital Signs BP (!) 144/96 (BP Location: Right Arm)   Pulse 90   Temp 97.7 F (36.5 C)   Resp 20   Ht 5\' 4"  (1.626 m)   Wt 77.1 kg   SpO2  96%   BMI 29.18 kg/m  Physical Exam Vitals and nursing note reviewed.  Constitutional:      General: She is not in acute distress.    Appearance: Normal appearance. She is well-developed.  HENT:     Head: Normocephalic and atraumatic.  Eyes:     Conjunctiva/sclera: Conjunctivae normal.  Cardiovascular:     Rate and Rhythm: Normal rate and regular rhythm.     Heart sounds: No murmur heard. Pulmonary:     Effort: Pulmonary effort is normal. No respiratory distress.     Breath sounds: Normal breath sounds.  Abdominal:     Palpations: Abdomen is soft.     Tenderness: There is no abdominal tenderness.  Musculoskeletal:        General: Tenderness present. No deformity.     Cervical back: Neck supple.     Comments: She has diffuse tenderness around her left shoulder and palpation reproduces her pain.  She has pain with any type of range of motion.  Landmarks are normal.  Nontender elbow wrist.  Distal pulses motor and sensation intact.  No overlying bruising or other skin findings.  Skin:    General: Skin is warm and dry.      Capillary Refill: Capillary refill takes less than 2 seconds.  Neurological:     General: No focal deficit present.     Mental Status: She is alert.     ED Results / Procedures / Treatments   Labs (all labs ordered are listed, but only abnormal results are displayed) Labs Reviewed - No data to display  EKG None  Radiology No results found.  Procedures Procedures  {Document cardiac monitor, telemetry assessment procedure when appropriate:1}  Medications Ordered in ED Medications  oxyCODONE-acetaminophen (PERCOCET/ROXICET) 5-325 MG per tablet 1 tablet (has no administration in time range)    ED Course/ Medical Decision Making/ A&P   {   Click here for ABCD2, HEART and other calculatorsREFRESH Note before signing :1}                          Medical Decision Making Amount and/or Complexity of Data Reviewed Radiology: ordered.  Risk Prescription drug management.   This patient complains of ***; this involves an extensive number of treatment Options and is a complaint that carries with it a high risk of complications and morbidity. The differential includes ***  I ordered, reviewed and interpreted labs, which included *** I ordered medication *** and reviewed PMP when indicated. I ordered imaging studies which included *** and I independently    visualized and interpreted imaging which showed *** Additional history obtained from *** Previous records obtained and reviewed *** I consulted *** and discussed lab and imaging findings and discussed disposition.  Cardiac monitoring reviewed, *** Social determinants considered, *** Critical Interventions: ***  After the interventions stated above, I reevaluated the patient and found *** Admission and further testing considered, ***   {Document critical care time when appropriate:1} {Document review of labs and clinical decision tools ie heart score, Chads2Vasc2 etc:1}  {Document your independent review of radiology  images, and any outside records:1} {Document your discussion with family members, caretakers, and with consultants:1} {Document social determinants of health affecting pt's care:1} {Document your decision making why or why not admission, treatments were needed:1} Final Clinical Impression(s) / ED Diagnoses Final diagnoses:  None    Rx / DC Orders ED Discharge Orders     None

## 2022-09-14 NOTE — Discharge Instructions (Addendum)
You are seen in the emergency department for worsening of your chronic left shoulder pain.  You had x-rays and a CAT scan that did not show any fracture or dislocation.  There was significant arthritis changes and rotator cuff findings that likely explain your symptoms.  Be important for you to follow-up with her orthopedist.  Sling as needed.  Pain medicines being prescribed.  Please use caution with these as they can make you dizzy nauseous constipated.  Included below is the final report of the CAT scan.  IMPRESSION:  1. No acute fracture.  2. Severe glenohumeral joint osteoarthritis.  3. Mild acromioclavicular joint osteoarthritis. Mild narrowing of  the distal lateral subacromial space as can be seen with subacromial  impingement.  4. Moderate supraspinatus muscle atrophy. Note is made of high-grade  partial and focal full-thickness tears of the anterior supraspinatus  tendon footprint seen on prior MRI. High-grade partial-thickness  tear of the articular side of the supraspinatus musculotendinous  junction also better seen on prior MRI.  5. Moderate fluid within the subacromial/subdeltoid bursa, similar  to prior MRI.  6. Dense coronary artery calcifications.

## 2022-09-14 NOTE — ED Notes (Signed)
Hot pack for shoulder pain

## 2022-09-14 NOTE — ED Notes (Signed)
Pt verbalized understanding of discharge instructions. Opportunity for questions provided.  

## 2022-09-14 NOTE — ED Notes (Signed)
ED Provider at bedside. 

## 2022-09-21 NOTE — Progress Notes (Unsigned)
Established patient visit   Patient: Kristina Patel   DOB: 10/10/1945   77 y.o. Female  MRN: [id number] Visit Date: 09/22/2022  Today's healthcare provider: Alfredia Ferguson, PA-C   No chief complaint on file.  Subjective    HPI  ***  Medications: Outpatient Medications Prior to Visit  Medication Sig   acetaminophen (TYLENOL) 325 MG tablet Take 325 mg by mouth every 6 (six) hours as needed for headache (pain).   ACIDOPHILUS LACTOBACILLUS PO Take 2 tablets by mouth 3 (three) times daily with meals.    ALPRAZolam (XANAX) 0.5 MG tablet Take 1 tablet (0.5 mg total) by mouth every 12 (twelve) hours as needed.   ascorbic acid (VITAMIN C) 500 MG tablet Take by mouth.   aspirin-acetaminophen-caffeine (EXCEDRIN MIGRAINE) 250-250-65 MG tablet Take 1 tablet by mouth daily as needed for headache or migraine.   atorvastatin (LIPITOR) 10 MG tablet Take 10 mg by mouth daily.    baclofen (LIORESAL) 10 MG tablet Take 1 tablet by mouth 3 (three) times daily.   calcium-vitamin D (OSCAL WITH D) 500-200 MG-UNIT TABS tablet Take 1 tablet by mouth every evening.   Carboxymeth-Cellulose-CitricAc (PLENITY) CAPS Take 1 capsule by mouth 2 (two) times daily before lunch and supper.   Certolizumab Pegol (CIMZIA PREFILLED) 2 X 200 MG/ML PSKT Inject into the skin.   Cholecalciferol (D2000 ULTRA STRENGTH) 50 MCG (2000 UT) CAPS Take by mouth.   cyanocobalamin 1000 MCG tablet Take 1,000 mcg by mouth daily.   cyclobenzaprine (FLEXERIL) 10 MG tablet Take 1 tablet (10 mg total) by mouth 3 (three) times daily as needed for muscle spasms.   denosumab (PROLIA) 60 MG/ML SOSY injection Inject 60 mg into the skin every 6 (six) months. Last injection July 2019   diclofenac Sodium (VOLTAREN) 1 % GEL Apply 2 g topically 4 (four) times daily.   diphenhydrAMINE (BENADRYL) 25 mg capsule Take by mouth.   esomeprazole (NEXIUM) 40 MG capsule Take by mouth.   famotidine (PEPCID) 20 MG tablet Take 1 tablet (20 mg total) by  mouth at bedtime.   fenofibrate (TRICOR) 48 MG tablet TAKE ONE TABLET BY MOUTH DAILY   Flaxseed, Linseed, (FLAXSEED OIL) 1000 MG CAPS Take 1,000 mg by mouth daily with supper.    leflunomide (ARAVA) 10 MG tablet Take 1 tablet by mouth daily.   lidocaine (XYLOCAINE) 2 % jelly 1 application as needed.   Magnesium Oxide 420 MG TABS Take 1 tablet by mouth daily.   metFORMIN (GLUCOPHAGE) 500 MG tablet Take 1 tablet (500 mg total) by mouth 2 (two) times daily with a meal.   metoprolol tartrate (LOPRESSOR) 50 MG tablet Take by mouth.   montelukast (SINGULAIR) 10 MG tablet Take 1 tablet (10 mg total) by mouth at bedtime.   Multiple Vitamin (MULTIVITAMIN WITH MINERALS) TABS tablet Take 1 tablet by mouth daily with supper.   nystatin (MYCOSTATIN/NYSTOP) powder Apply topically.   ondansetron (ZOFRAN-ODT) 4 MG disintegrating tablet PLACE 1 TABLET BY MOUTH EVERY 8 HOURS AS NEEDED FOR NAUSEA AND/OR VOMITING   oxyCODONE-acetaminophen (PERCOCET/ROXICET) 5-325 MG tablet Take 1 tablet by mouth every 6 (six) hours as needed for severe pain.   pantoprazole (PROTONIX) 40 MG tablet Take 40 mg by mouth daily.   Semaglutide,0.25 or 0.5MG /DOS, (OZEMPIC, 0.25 OR 0.5 MG/DOSE,) 2 MG/3ML SOPN Inject 0.5 mg into the skin once a week.   sertraline (ZOLOFT) 100 MG tablet Take by mouth.   topiramate (TOPAMAX) 25 MG tablet Take 2 tablets (50 mg  total) by mouth 2 (two) times daily.   triamcinolone acetonide (TRIESENCE) 40 MG/ML SUSP Inject into the articular space.   triamcinolone cream (KENALOG) 0.1 % Apply 1 application topically 2 (two) times daily.   TURMERIC PO Take 900 mg by mouth 3 (three) times daily with meals.   No facility-administered medications prior to visit.    Review of Systems     Objective    There were no vitals taken for this visit.   Physical Exam  ***  No results found for any visits on 09/22/22.  Assessment & Plan     ***  No follow-ups on file.      {provider  attestation***:1}   Alfredia Ferguson, PA-C  [facility] at [facility] [phone] (phone) [phone] (fax)  [facility]

## 2022-09-22 ENCOUNTER — Ambulatory Visit (INDEPENDENT_AMBULATORY_CARE_PROVIDER_SITE_OTHER): Payer: Medicare HMO | Admitting: Physician Assistant

## 2022-09-22 VITALS — BP 128/74 | HR 70 | Temp 98.2°F | Resp 20 | Wt 168.0 lb

## 2022-09-22 DIAGNOSIS — R053 Chronic cough: Secondary | ICD-10-CM

## 2022-09-22 DIAGNOSIS — H9313 Tinnitus, bilateral: Secondary | ICD-10-CM | POA: Diagnosis not present

## 2022-09-22 DIAGNOSIS — K121 Other forms of stomatitis: Secondary | ICD-10-CM

## 2022-09-22 DIAGNOSIS — R490 Dysphonia: Secondary | ICD-10-CM | POA: Diagnosis not present

## 2022-09-23 ENCOUNTER — Telehealth: Payer: Self-pay | Admitting: Pharmacy Technician

## 2022-09-23 DIAGNOSIS — Z5986 Financial insecurity: Secondary | ICD-10-CM

## 2022-09-23 NOTE — Progress Notes (Addendum)
[facility])                                            [facility] Quality Pharmacy Team    09/23/2022  Kristina Patel 03/19/1945 [id number]  Received patient and provider portion(s) of patient assistance application(s) for Ozempic. Patient gave verbal consent to allow [employer] to electronically verify income. Faxed completed application and required documents into [employer] on 09/24/22.  [name], CPhT [place]  [facility] Office: [phone] Fax: [phone] Email: [email or web address]

## 2022-09-27 ENCOUNTER — Telehealth: Payer: Self-pay | Admitting: Pharmacy Technician

## 2022-09-27 ENCOUNTER — Telehealth: Payer: Self-pay | Admitting: Family Medicine

## 2022-09-27 DIAGNOSIS — Z5986 Financial insecurity: Secondary | ICD-10-CM

## 2022-09-27 NOTE — Telephone Encounter (Signed)
[employer]) called stating that pt had been approved for the [employer] pt assistance program and they will be sending out a quantity of 5 Ozempic pens out to the pt.

## 2022-09-27 NOTE — Progress Notes (Addendum)
[facility])                                            [facility] Quality Pharmacy Team    09/27/2022  Kristina Patel 1945/09/11 [id number]  Care coordination call placed to [employer] in regard to Ozempic application.  Spoke to [place] who informs patient is APPROVED 09/27/22-02/22/23. Medication and subsequent refills will auto fill and ship to the prescriber's office. If at any time, patient feels current supply is not sufficient until next refill arrives, patient may call [employer] at [phone]. Successful outreach to patient. HIPAA verified. Informed patient of approval.  [name], CPhT [place]  [facility] Office: [phone] Fax: [phone] Email: [email or web address]

## 2022-10-11 DIAGNOSIS — H6123 Impacted cerumen, bilateral: Secondary | ICD-10-CM | POA: Diagnosis not present

## 2022-10-11 DIAGNOSIS — H9313 Tinnitus, bilateral: Secondary | ICD-10-CM | POA: Diagnosis not present

## 2022-10-11 DIAGNOSIS — K120 Recurrent oral aphthae: Secondary | ICD-10-CM | POA: Diagnosis not present

## 2022-10-11 DIAGNOSIS — L739 Follicular disorder, unspecified: Secondary | ICD-10-CM | POA: Diagnosis not present

## 2022-10-13 ENCOUNTER — Telehealth: Payer: Self-pay | Admitting: Family Medicine

## 2022-10-13 NOTE — Telephone Encounter (Signed)
Pt will pick up ozempic medication tomorrow

## 2022-10-19 DIAGNOSIS — M1712 Unilateral primary osteoarthritis, left knee: Secondary | ICD-10-CM | POA: Diagnosis not present

## 2022-10-19 DIAGNOSIS — M19012 Primary osteoarthritis, left shoulder: Secondary | ICD-10-CM | POA: Diagnosis not present

## 2022-10-21 ENCOUNTER — Encounter: Payer: Self-pay | Admitting: Podiatry

## 2022-10-21 ENCOUNTER — Ambulatory Visit: Payer: Medicare HMO | Admitting: Podiatry

## 2022-10-21 DIAGNOSIS — L84 Corns and callosities: Secondary | ICD-10-CM

## 2022-10-21 DIAGNOSIS — E1149 Type 2 diabetes mellitus with other diabetic neurological complication: Secondary | ICD-10-CM | POA: Diagnosis not present

## 2022-10-21 NOTE — Progress Notes (Signed)
  Subjective:  Patient ID: Kristina Patel, female    DOB: 08-Feb-1946,   MRN: [id number]  Chief Complaint  Patient presents with   Nail Problem    Pt present    77 y.o. female presents for concern of painful calluses and deformities in her feet. Relates burning and tingling in their feet. Patient is diabetic and last A1c was  Lab Results  Component Value Date   HGBA1C 6.1 12/09/2021   . Also has a history of RA.   PCP:  Sharlene Dory, DO    . Denies any other pedal complaints. Denies n/v/f/c.   Past Medical History:  Diagnosis Date   Bacterial overgrowth syndrome    Breast mass    Carpal tunnel syndrome    Colon polyps    Degenerative disc disease    Depression    Fall    GERD (gastroesophageal reflux disease)    Gouty arthropathy    Headache    Hyperlipidemia    Hypertension    IBS (irritable bowel syndrome)    constipation   Internal prolapsed hemorrhoids    Iron deficiency anemia    Melanoma (HCC)    Osteoarthritis    Osteoporosis    Ovarian cancer (HCC)    Pelvic floor dysfunction 05/05/2016   Peripheral neuropathy    Pulmonary nodules    Renal disease    Rheumatoid arthritis(714.0)    Squamous cell carcinoma skin of abdomen    Uterine cancer (HCC)    Vitamin D deficiency     Objective:  Physical Exam: Vascular: DP/PT pulses 2/4 bilateral. CFT <3 seconds. Normal hair growth on digits. No edema.  Skin. No lacerations or abrasions bilateral feet. Hyperkeratotic lesions noted sub first metatrsal bilateral and sub third and fifth metatarsal on the right.  Musculoskeletal: MMT 5/5 bilateral lower extremities in DF, PF, Inversion and Eversion. Deceased ROM in DF of ankle joint. Severe HAV deformity noted bilateral. Hammered digits 2-5 bilateral.  Neurological: Sensation intact to light touch.   Assessment:   1. Pre-ulcerative calluses   2. Type II diabetes mellitus with neurological manifestations (HCC)      Plan:  Patient was evaluated and  treated and all questions answered. -Discussed and educated patient on diabetic foot care, especially with  regards to the vascular, neurological and musculoskeletal systems.  -Stressed the importance of good glycemic control and the detriment of not  controlling glucose levels in relation to the foot. -Discussed supportive shoes at all times and checking feet regularly.  -Mechanically debrided al hyperkeratotic lesions with chisel without incident.   -Answered all patient questions -Patient to return  in 9 weeks  for at risk foot care -Patient advised to call the office if any problems or questions arise in the meantime.   Louann Sjogren, DPM

## 2022-10-26 DIAGNOSIS — Z888 Allergy status to other drugs, medicaments and biological substances status: Secondary | ICD-10-CM | POA: Diagnosis not present

## 2022-10-26 DIAGNOSIS — Z79899 Other long term (current) drug therapy: Secondary | ICD-10-CM | POA: Diagnosis not present

## 2022-10-26 DIAGNOSIS — M791 Myalgia, unspecified site: Secondary | ICD-10-CM | POA: Diagnosis not present

## 2022-10-26 DIAGNOSIS — Z880 Allergy status to penicillin: Secondary | ICD-10-CM | POA: Diagnosis not present

## 2022-10-26 DIAGNOSIS — Z794 Long term (current) use of insulin: Secondary | ICD-10-CM | POA: Diagnosis not present

## 2022-10-26 DIAGNOSIS — M47812 Spondylosis without myelopathy or radiculopathy, cervical region: Secondary | ICD-10-CM | POA: Diagnosis not present

## 2022-10-26 DIAGNOSIS — Z885 Allergy status to narcotic agent status: Secondary | ICD-10-CM | POA: Diagnosis not present

## 2022-10-26 DIAGNOSIS — Z881 Allergy status to other antibiotic agents status: Secondary | ICD-10-CM | POA: Diagnosis not present

## 2022-10-26 DIAGNOSIS — M549 Dorsalgia, unspecified: Secondary | ICD-10-CM | POA: Diagnosis not present

## 2022-10-27 DIAGNOSIS — M4807 Spinal stenosis, lumbosacral region: Secondary | ICD-10-CM | POA: Diagnosis not present

## 2022-10-27 DIAGNOSIS — M47812 Spondylosis without myelopathy or radiculopathy, cervical region: Secondary | ICD-10-CM | POA: Diagnosis not present

## 2022-10-27 DIAGNOSIS — M5416 Radiculopathy, lumbar region: Secondary | ICD-10-CM | POA: Diagnosis not present

## 2022-11-07 ENCOUNTER — Other Ambulatory Visit: Payer: Self-pay | Admitting: Family Medicine

## 2022-11-07 DIAGNOSIS — U071 COVID-19: Secondary | ICD-10-CM

## 2022-11-22 DIAGNOSIS — M48061 Spinal stenosis, lumbar region without neurogenic claudication: Secondary | ICD-10-CM | POA: Diagnosis not present

## 2022-11-22 DIAGNOSIS — Z79891 Long term (current) use of opiate analgesic: Secondary | ICD-10-CM | POA: Diagnosis not present

## 2022-11-22 DIAGNOSIS — M5136 Other intervertebral disc degeneration, lumbar region: Secondary | ICD-10-CM | POA: Diagnosis not present

## 2022-11-22 DIAGNOSIS — M4726 Other spondylosis with radiculopathy, lumbar region: Secondary | ICD-10-CM | POA: Diagnosis not present

## 2022-12-03 ENCOUNTER — Other Ambulatory Visit: Payer: Self-pay | Admitting: Family Medicine

## 2022-12-03 DIAGNOSIS — U071 COVID-19: Secondary | ICD-10-CM

## 2022-12-13 DIAGNOSIS — M47812 Spondylosis without myelopathy or radiculopathy, cervical region: Secondary | ICD-10-CM | POA: Diagnosis not present

## 2022-12-17 ENCOUNTER — Other Ambulatory Visit: Payer: Self-pay | Admitting: Family Medicine

## 2022-12-23 ENCOUNTER — Ambulatory Visit: Payer: Medicare HMO | Admitting: Podiatry

## 2022-12-24 ENCOUNTER — Ambulatory Visit: Payer: Medicare HMO | Admitting: Podiatry

## 2022-12-31 ENCOUNTER — Other Ambulatory Visit: Payer: Self-pay | Admitting: Family Medicine

## 2022-12-31 ENCOUNTER — Ambulatory Visit: Payer: Medicare HMO | Admitting: Podiatry

## 2022-12-31 ENCOUNTER — Encounter: Payer: Self-pay | Admitting: Podiatry

## 2022-12-31 DIAGNOSIS — E1149 Type 2 diabetes mellitus with other diabetic neurological complication: Secondary | ICD-10-CM | POA: Diagnosis not present

## 2022-12-31 DIAGNOSIS — M79675 Pain in left toe(s): Secondary | ICD-10-CM

## 2022-12-31 DIAGNOSIS — M79674 Pain in right toe(s): Secondary | ICD-10-CM | POA: Diagnosis not present

## 2022-12-31 DIAGNOSIS — B351 Tinea unguium: Secondary | ICD-10-CM

## 2022-12-31 DIAGNOSIS — L84 Corns and callosities: Secondary | ICD-10-CM

## 2022-12-31 NOTE — Telephone Encounter (Signed)
Requesting: Xanax 0.5 mg  Contract: no UDS: 06/09/2021 Last Visit: 12/09/2021 normal follow up (09/22/2022 urg visit) Next Visit: not scheduled Last Refill: 05/31/2022 #60 with 5 refills  Please Advise

## 2022-12-31 NOTE — Progress Notes (Signed)
  Subjective:  Patient ID: Kristina Patel, female    DOB: [birth year],   MRN: [id number]  No chief complaint on file.   77 y.o. female presents for concern of painful calluses and deformities in her feet. Relates burning and tingling in their feet. Patient is diabetic and last A1c was  Lab Results  Component Value Date   HGBA1C 6.1 12/09/2021   . Also has a history of RA.   PCP:  Sharlene Dory, DO    . Denies any other pedal complaints. Denies n/v/f/c.   Past Medical History:  Diagnosis Date   Bacterial overgrowth syndrome    Breast mass    Carpal tunnel syndrome    Colon polyps    Degenerative disc disease    Depression    Fall    GERD (gastroesophageal reflux disease)    Gouty arthropathy    Headache    Hyperlipidemia    Hypertension    IBS (irritable bowel syndrome)    constipation   Internal prolapsed hemorrhoids    Iron deficiency anemia    Melanoma (HCC)    Osteoarthritis    Osteoporosis    Ovarian cancer (HCC)    Pelvic floor dysfunction 05/05/2016   Peripheral neuropathy    Pulmonary nodules    Renal disease    Rheumatoid arthritis(714.0)    Squamous cell carcinoma skin of abdomen    Uterine cancer (HCC)    Vitamin D deficiency     Objective:  Physical Exam: Vascular: DP/PT pulses 2/4 bilateral. CFT <3 seconds. Normal hair growth on digits. No edema.  Skin. No lacerations or abrasions bilateral feet. Hyperkeratotic lesions noted sub first metatrsal bilateral and sub third and fifth metatarsal on the right.  Musculoskeletal: MMT 5/5 bilateral lower extremities in DF, PF, Inversion and Eversion. Deceased ROM in DF of ankle joint. Severe HAV deformity noted bilateral. Hammered digits 2-5 bilateral.  Neurological: Sensation intact to light touch.   Assessment:   1. Pre-ulcerative calluses   2. Type II diabetes mellitus with neurological manifestations (HCC)   3. Pain in toes of both feet   4. Dermatophytosis of nail      Plan:  Patient was  evaluated and treated and all questions answered. -Discussed and educated patient on diabetic foot care, especially with  regards to the vascular, neurological and musculoskeletal systems.  -Stressed the importance of good glycemic control and the detriment of not  controlling glucose levels in relation to the foot. -Discussed supportive shoes at all times and checking feet regularly.  -Mechanically debrided al hyperkeratotic lesions with chisel without incident.   -Answered all patient questions -Patient to return  in 9 weeks  for at risk foot care -Patient advised to call the office if any problems or questions arise in the meantime.   Louann Sjogren, DPM

## 2023-01-03 ENCOUNTER — Telehealth: Payer: Self-pay

## 2023-01-03 DIAGNOSIS — M47816 Spondylosis without myelopathy or radiculopathy, lumbar region: Secondary | ICD-10-CM | POA: Diagnosis not present

## 2023-01-03 DIAGNOSIS — Z79891 Long term (current) use of opiate analgesic: Secondary | ICD-10-CM | POA: Diagnosis not present

## 2023-01-03 DIAGNOSIS — G894 Chronic pain syndrome: Secondary | ICD-10-CM | POA: Diagnosis not present

## 2023-01-03 NOTE — Telephone Encounter (Signed)
Called pt was advised per Dr.Wendling will need Visit for medication check. Pt stated she will call back to Get appt.

## 2023-01-05 ENCOUNTER — Telehealth: Payer: Self-pay

## 2023-01-05 NOTE — Telephone Encounter (Signed)
Called pt was advised her Ozempic is here ready Ready for pick up.

## 2023-01-10 ENCOUNTER — Other Ambulatory Visit (HOSPITAL_BASED_OUTPATIENT_CLINIC_OR_DEPARTMENT_OTHER): Payer: Self-pay

## 2023-01-10 ENCOUNTER — Encounter: Payer: Self-pay | Admitting: Family Medicine

## 2023-01-10 ENCOUNTER — Ambulatory Visit (INDEPENDENT_AMBULATORY_CARE_PROVIDER_SITE_OTHER): Payer: Medicare HMO | Admitting: Family Medicine

## 2023-01-10 VITALS — BP 126/78 | HR 85 | Temp 98.0°F | Resp 16 | Ht 64.0 in | Wt 168.0 lb

## 2023-01-10 DIAGNOSIS — R109 Unspecified abdominal pain: Secondary | ICD-10-CM | POA: Diagnosis not present

## 2023-01-10 DIAGNOSIS — M069 Rheumatoid arthritis, unspecified: Secondary | ICD-10-CM

## 2023-01-10 DIAGNOSIS — G8929 Other chronic pain: Secondary | ICD-10-CM

## 2023-01-10 DIAGNOSIS — Z79899 Other long term (current) drug therapy: Secondary | ICD-10-CM

## 2023-01-10 DIAGNOSIS — F411 Generalized anxiety disorder: Secondary | ICD-10-CM

## 2023-01-10 MED ORDER — OZEMPIC (0.25 OR 0.5 MG/DOSE) 2 MG/3ML ~~LOC~~ SOPN: 0.5000 mg | PEN_INJECTOR | SUBCUTANEOUS | 5 refills | Status: DC

## 2023-01-10 MED ORDER — ALPRAZOLAM 0.5 MG PO TABS
0.5000 mg | ORAL_TABLET | Freq: Two times a day (BID) | ORAL | 5 refills | Status: DC | PRN
Start: 2023-01-10 — End: 2023-08-01

## 2023-01-10 MED ORDER — OZEMPIC (0.25 OR 0.5 MG/DOSE) 2 MG/3ML ~~LOC~~ SOPN
0.5000 mg | PEN_INJECTOR | SUBCUTANEOUS | 5 refills | Status: DC
Start: 2023-01-10 — End: 2023-08-01
  Filled 2023-01-10: qty 3, 28d supply, fill #0

## 2023-01-10 NOTE — Patient Instructions (Addendum)
Please consider the Shingrix.   If you do not hear anything about your referral in the next 1-2 weeks, call our office and ask for an update.  Consider concentrated peppermint (IB-Gard).   Let us know if you need anything.

## 2023-01-10 NOTE — Progress Notes (Signed)
Chief Complaint  Patient presents with   Medication Refill    Discuss medication    Subjective Kristina Patel presents for f/u anxiety/depression.  Pt is currently being treated with Xanax 0.5 mg bid prn, Zoloft 100 mg/d.  Reports doing well since treatment. No thoughts of harming self or others. No self-medication with alcohol, prescription drugs or illicit drugs. Pt is following with a counselor/psychologist.  Chronic pain Has RA. Takes Flexeril 10 mg 2-3 times daily as needed. It does cause some drowsiness but is helpful.   She has around a decade of chronic abdominal pain and bloating.  Associated constipation.  No longer on chronic narcotics.  She has failed MiraLAX, Linzess, Amitiza.  Does not remember what else she has been on.  Possibly has been on Cymbalta as well.  She does take Zoloft as above.  Past Medical History:  Diagnosis Date   Bacterial overgrowth syndrome    Breast mass    Carpal tunnel syndrome    Colon polyps    Degenerative disc disease    Depression    Fall    GERD (gastroesophageal reflux disease)    Gouty arthropathy    Headache    Hyperlipidemia    Hypertension    IBS (irritable bowel syndrome)    constipation   Internal prolapsed hemorrhoids    Iron deficiency anemia    Melanoma (HCC)    Osteoarthritis    Osteoporosis    Ovarian cancer (HCC)    Pelvic floor dysfunction 05/05/2016   Peripheral neuropathy    Pulmonary nodules    Renal disease    Rheumatoid arthritis(714.0)    Squamous cell carcinoma skin of abdomen    Uterine cancer (HCC)    Vitamin D deficiency    Allergies as of 01/10/2023       Reactions   Butalbital-asa-caff-codeine [fiorinal-codeine] Anaphylaxis, Swelling   Darvon Other (See Comments)   Irritable & hyper   Fiorinal [butalbital-aspirin-caffeine] Anaphylaxis   Penciclovir Anaphylaxis   Penicillins Anaphylaxis   Has patient had a PCN reaction causing immediate rash, facial/tongue/throat swelling, SOB or  lightheadedness with hypotension: Yes Has patient had a PCN reaction causing severe rash involving mucus membranes or skin necrosis: No Has patient had a PCN reaction that required hospitalization: No Has patient had a PCN reaction occurring within the last 10 years: No If all of the above answers are "NO", then may proceed with Cephalosporin use.   Buprenorphine Rash   Amitriptyline Other (See Comments)   hyperactivity   Ativan [lorazepam] Other (See Comments)   Adverse reaction...made patient "act out" - out of her mind   Bactrim [sulfamethoxazole-trimethoprim] Diarrhea, Nausea And Vomiting, Other (See Comments)   Severe cramps   Ciprofloxacin Hcl Nausea And Vomiting, Other (See Comments)   hallucinations   Gabapentin    Lisinopril Cough   Tape Other (See Comments)   Allergic to plastic tape. The adhesive causes redness to skin as well as loss of skin. - please use paper tape   Ambien [zolpidem Tartrate] Other (See Comments)   Sleep walking   Benzodiazepines Cough   Metoclopramide Anxiety   Propoxyphene Rash, Other (See Comments)   Irritable and hyperactive   Zolpidem Tartrate    Other reaction(s): Other (See Comments) Sleep walking Other reaction(s): Other (See Comments) Sleep walking        Medication List        Accurate as of January 10, 2023  5:02 PM. If you have any questions, ask your nurse or  doctor.          STOP taking these medications    ascorbic acid 500 MG tablet Commonly known as: VITAMIN C Stopped by: Jilda Roche Rayshawn Visconti   baclofen 10 MG tablet Commonly known as: LIORESAL Stopped by: Jilda Roche Daaiel Starlin   denosumab 60 MG/ML Sosy injection Commonly known as: PROLIA Stopped by: Sharlene Dory   Plenity Caps Stopped by: Jilda Roche Dyna Figuereo       TAKE these medications    acetaminophen 325 MG tablet Commonly known as: TYLENOL Take 325 mg by mouth every 6 (six) hours as needed for headache (pain).   ACIDOPHILUS  LACTOBACILLUS PO Take 2 tablets by mouth 3 (three) times daily with meals.   ALPRAZolam 0.5 MG tablet Commonly known as: XANAX Take 1 tablet (0.5 mg total) by mouth every 12 (twelve) hours as needed.   aspirin-acetaminophen-caffeine 250-250-65 MG tablet Commonly known as: EXCEDRIN MIGRAINE Take 1 tablet by mouth daily as needed for headache or migraine.   atorvastatin 10 MG tablet Commonly known as: LIPITOR Take 10 mg by mouth daily.   calcium-vitamin D 500-200 MG-UNIT Tabs tablet Commonly known as: OSCAL WITH D Take 1 tablet by mouth every evening.   Cimzia Prefilled 2 X 200 MG/ML prefilled syringe Generic drug: certolizumab pegol Inject into the skin.   cyanocobalamin 1000 MCG tablet Take 1,000 mcg by mouth daily.   cyclobenzaprine 10 MG tablet Commonly known as: FLEXERIL TAKE ONE TABLET BY MOUTH THREE TIMES A DAY AS NEEDED FOR MUSCLE SPASMS   D2000 Ultra Strength 50 MCG (2000 UT) Caps Generic drug: Cholecalciferol Take by mouth.   diclofenac Sodium 1 % Gel Commonly known as: Voltaren Apply 2 g topically 4 (four) times daily.   diphenhydrAMINE 25 mg capsule Commonly known as: BENADRYL Take by mouth.   esomeprazole 40 MG capsule Commonly known as: NEXIUM Take by mouth.   famotidine 20 MG tablet Commonly known as: PEPCID Take 1 tablet (20 mg total) by mouth at bedtime.   fenofibrate 48 MG tablet Commonly known as: TRICOR TAKE ONE TABLET BY MOUTH DAILY   Flaxseed Oil 1000 MG Caps Take 1,000 mg by mouth daily with supper.   leflunomide 10 MG tablet Commonly known as: ARAVA Take 1 tablet by mouth daily.   lidocaine 2 % jelly Commonly known as: XYLOCAINE 1 application as needed.   Magnesium Oxide 420 MG Tabs Take 1 tablet by mouth daily.   metFORMIN 500 MG tablet Commonly known as: GLUCOPHAGE Take 1 tablet (500 mg total) by mouth 2 (two) times daily with a meal.   metoprolol tartrate 50 MG tablet Commonly known as: LOPRESSOR Take by mouth.    montelukast 10 MG tablet Commonly known as: SINGULAIR Take 1 tablet (10 mg total) by mouth at bedtime.   multivitamin with minerals Tabs tablet Take 1 tablet by mouth daily with supper.   nystatin powder Commonly known as: MYCOSTATIN/NYSTOP Apply topically.   ondansetron 4 MG disintegrating tablet Commonly known as: ZOFRAN-ODT PLACE 1 TABLET BY MOUTH EVERY 8 HOURS AS NEEDED FOR NAUSEA AND/OR VOMITING   oxyCODONE-acetaminophen 5-325 MG tablet Commonly known as: PERCOCET/ROXICET Take 1 tablet by mouth every 6 (six) hours as needed for severe pain.   Ozempic (0.25 or 0.5 MG/DOSE) 2 MG/3ML Sopn Generic drug: Semaglutide(0.25 or 0.5MG /DOS) Inject 0.5 mg into the skin once a week.   pantoprazole 40 MG tablet Commonly known as: PROTONIX Take 40 mg by mouth daily.   sertraline 100 MG tablet Commonly known as: ZOLOFT  Take by mouth.   topiramate 25 MG tablet Commonly known as: TOPAMAX Take 2 tablets (50 mg total) by mouth 2 (two) times daily.   triamcinolone acetonide 40 MG/ML Susp Commonly known as: TRIESENCE Inject into the articular space.   triamcinolone cream 0.1 % Commonly known as: KENALOG Apply 1 application topically 2 (two) times daily.   TURMERIC PO Take 900 mg by mouth 3 (three) times daily with meals.        Exam BP 126/78 (BP Location: Left Arm, Patient Position: Sitting, Cuff Size: Normal)   Pulse 85   Temp 98 F (36.7 C) (Oral)   Resp 16   Ht 5\' 4"  (1.626 m)   Wt 168 lb (76.2 kg)   SpO2 95%   BMI 28.84 kg/m  General:  well developed, well nourished, in no apparent distress Lungs:  No respiratory distress Psych: well oriented with normal range of affect and age-appropriate judgement/insight, alert and oriented x4.  Assessment and Plan  GAD (generalized anxiety disorder) - Plan: ALPRAZolam (XANAX) 0.5 MG tablet  High risk medication use - Plan: Drug Monitoring Panel [phone] , Urine  Rheumatoid arthritis involving both hands, unspecified  whether rheumatoid factor present (HCC)  Chronic abdominal pain - Plan: Ambulatory referral to Gastroenterology  Chronic, stable. Cont Xanax 0.5 mg bid prn. Update CSC and UDS.  As above. Chronic, stable. Appreciate pain management. Cont Flexeril 10 mg 2-3 times daily prn.  Chronic, not controlled.  Consider concentrated peppermint.  Refer to [facility] for their opinion. F/u in 6 months. The patient voiced understanding and agreement to the plan.  Jilda Roche [place], DO 01/10/23 5:02 PM

## 2023-01-11 ENCOUNTER — Encounter: Payer: Self-pay | Admitting: Family Medicine

## 2023-01-11 ENCOUNTER — Other Ambulatory Visit (HOSPITAL_BASED_OUTPATIENT_CLINIC_OR_DEPARTMENT_OTHER): Payer: Self-pay

## 2023-01-11 NOTE — Telephone Encounter (Signed)
Medication updated

## 2023-01-14 ENCOUNTER — Other Ambulatory Visit (HOSPITAL_BASED_OUTPATIENT_CLINIC_OR_DEPARTMENT_OTHER): Payer: Self-pay

## 2023-01-14 LAB — DRUG MONITORING PANEL 376104, URINE
Alphahydroxyalprazolam: 236 ng/mL — ABNORMAL HIGH (ref <25)
Alphahydroxymidazolam: NEGATIVE ng/mL (ref <50)
Alphahydroxytriazolam: NEGATIVE ng/mL (ref <50)
Aminoclonazepam: NEGATIVE ng/mL (ref <25)
Amphetamine: NEGATIVE ng/mL (ref <250)
Amphetamines: NEGATIVE ng/mL (ref <500)
Barbiturates: NEGATIVE ng/mL (ref <300)
Benzodiazepines: POSITIVE ng/mL — AB (ref <100)
Cocaine Metabolite: NEGATIVE ng/mL (ref <150)
Codeine: NEGATIVE ng/mL (ref <50)
Desmethyltramadol: NEGATIVE ng/mL (ref <100)
Hydrocodone: 2306 ng/mL — ABNORMAL HIGH (ref <50)
Hydromorphone: 62 ng/mL — ABNORMAL HIGH (ref <50)
Hydroxyethylflurazepam: NEGATIVE ng/mL (ref <50)
Lorazepam: NEGATIVE ng/mL (ref <50)
Methamphetamine: NEGATIVE ng/mL (ref <250)
Morphine: NEGATIVE ng/mL (ref <50)
Nordiazepam: NEGATIVE ng/mL (ref <50)
Norhydrocodone: 3454 ng/mL — ABNORMAL HIGH (ref <50)
Opiates: POSITIVE ng/mL — AB (ref <100)
Oxazepam: NEGATIVE ng/mL (ref <50)
Oxycodone: NEGATIVE ng/mL (ref <100)
Temazepam: NEGATIVE ng/mL (ref <50)
Tramadol: NEGATIVE ng/mL (ref <100)

## 2023-01-14 LAB — DM TEMPLATE

## 2023-01-15 ENCOUNTER — Other Ambulatory Visit: Payer: Self-pay | Admitting: Family Medicine

## 2023-01-15 DIAGNOSIS — U071 COVID-19: Secondary | ICD-10-CM

## 2023-01-17 ENCOUNTER — Other Ambulatory Visit (HOSPITAL_BASED_OUTPATIENT_CLINIC_OR_DEPARTMENT_OTHER): Payer: Self-pay

## 2023-01-18 ENCOUNTER — Other Ambulatory Visit (HOSPITAL_BASED_OUTPATIENT_CLINIC_OR_DEPARTMENT_OTHER): Payer: Self-pay

## 2023-01-21 ENCOUNTER — Other Ambulatory Visit (HOSPITAL_BASED_OUTPATIENT_CLINIC_OR_DEPARTMENT_OTHER): Payer: Self-pay

## 2023-01-21 ENCOUNTER — Encounter (HOSPITAL_BASED_OUTPATIENT_CLINIC_OR_DEPARTMENT_OTHER): Payer: Self-pay

## 2023-01-24 ENCOUNTER — Other Ambulatory Visit (HOSPITAL_BASED_OUTPATIENT_CLINIC_OR_DEPARTMENT_OTHER): Payer: Self-pay

## 2023-02-08 ENCOUNTER — Telehealth: Payer: Self-pay | Admitting: Pharmacist

## 2023-02-08 NOTE — Telephone Encounter (Signed)
Received fax from [employer] Patient Assistance Program requesting application for 2025 for Ozempic for patient. However it looks like she has VA benefits which would cause her not to qualify for medication assistance program. Tried to call patient but my phone headset was not functioning correctly. I tried to call again but had to leave message with CB# [phone] or I am at the [place] location today [phone].   Henrene Pastor, PharmD Clinical Pharmacist [facility]  Population Health [phone]

## 2023-02-18 ENCOUNTER — Ambulatory Visit (INDEPENDENT_AMBULATORY_CARE_PROVIDER_SITE_OTHER): Payer: Medicare HMO | Admitting: Podiatry

## 2023-02-18 DIAGNOSIS — Z91199 Patient's noncompliance with other medical treatment and regimen due to unspecified reason: Secondary | ICD-10-CM

## 2023-02-18 NOTE — Progress Notes (Signed)
No show

## 2023-02-22 DIAGNOSIS — M25512 Pain in left shoulder: Secondary | ICD-10-CM | POA: Diagnosis not present

## 2023-02-22 DIAGNOSIS — M5416 Radiculopathy, lumbar region: Secondary | ICD-10-CM | POA: Diagnosis not present

## 2023-02-22 DIAGNOSIS — M545 Low back pain, unspecified: Secondary | ICD-10-CM | POA: Diagnosis not present

## 2023-02-23 ENCOUNTER — Other Ambulatory Visit: Payer: Self-pay | Admitting: Family Medicine

## 2023-02-24 ENCOUNTER — Ambulatory Visit: Payer: Medicare HMO | Admitting: Podiatry

## 2023-02-25 ENCOUNTER — Other Ambulatory Visit: Payer: Self-pay | Admitting: Family Medicine

## 2023-02-25 DIAGNOSIS — U071 COVID-19: Secondary | ICD-10-CM

## 2023-03-01 ENCOUNTER — Other Ambulatory Visit: Payer: Self-pay | Admitting: Family Medicine

## 2023-03-01 ENCOUNTER — Encounter: Payer: Self-pay | Admitting: Family Medicine

## 2023-03-01 MED ORDER — CYCLOBENZAPRINE HCL 10 MG PO TABS: 10.0000 mg | ORAL_TABLET | Freq: Three times a day (TID) | ORAL | 5 refills | Status: DC | PRN

## 2023-03-04 ENCOUNTER — Ambulatory Visit: Payer: Medicare Other | Admitting: Podiatry

## 2023-03-04 ENCOUNTER — Encounter: Payer: Self-pay | Admitting: Podiatry

## 2023-03-04 DIAGNOSIS — E1149 Type 2 diabetes mellitus with other diabetic neurological complication: Secondary | ICD-10-CM

## 2023-03-04 DIAGNOSIS — L84 Corns and callosities: Secondary | ICD-10-CM

## 2023-03-04 NOTE — Progress Notes (Signed)
  Subjective:  Patient ID: Kristina Patel, female    DOB: Dec 14, 1945,   MRN: [id number]  No chief complaint on file.   78 y.o. female presents for concern of painful calluses and deformities in her feet. Relates burning and tingling in their feet. Patient is diabetic and last A1c was  Lab Results  Component Value Date   HGBA1C 6.1 12/09/2021   . Also has a history of RA.   PCP:  Frann Mabel Mt, DO    . Denies any other pedal complaints. Denies n/v/f/c.   Past Medical History:  Diagnosis Date   Bacterial overgrowth syndrome    Breast mass    Carpal tunnel syndrome    Colon polyps    Degenerative disc disease    Depression    Fall    GERD (gastroesophageal reflux disease)    Gouty arthropathy    Headache    Hyperlipidemia    Hypertension    IBS (irritable bowel syndrome)    constipation   Internal prolapsed hemorrhoids    Iron deficiency anemia    Melanoma (HCC)    Osteoarthritis    Osteoporosis    Ovarian cancer (HCC)    Pelvic floor dysfunction 05/05/2016   Peripheral neuropathy    Pulmonary nodules    Renal disease    Rheumatoid arthritis(714.0)    Squamous cell carcinoma skin of abdomen    Uterine cancer (HCC)    Vitamin D deficiency     Objective:  Physical Exam: Vascular: DP/PT pulses 2/4 bilateral. CFT <3 seconds. Normal hair growth on digits. No edema.  Skin. No lacerations or abrasions bilateral feet. Hyperkeratotic lesions noted sub first metatrsal bilateral and sub third and fifth metatarsal on the right. Cracking in heel noted.  Musculoskeletal: MMT 5/5 bilateral lower extremities in DF, PF, Inversion and Eversion. Deceased ROM in DF of ankle joint. Severe HAV deformity noted bilateral. Hammered digits 2-5 bilateral.  Neurological: Sensation intact to light touch.   Assessment:   1. Pre-ulcerative calluses   2. Type II diabetes mellitus with neurological manifestations (HCC)       Plan:  Patient was evaluated and treated and all  questions answered. -Discussed and educated patient on diabetic foot care, especially with  regards to the vascular, neurological and musculoskeletal systems.  -Stressed the importance of good glycemic control and the detriment of not  controlling glucose levels in relation to the foot. -Discussed supportive shoes at all times and checking feet regularly.  -Mechanically debrided al hyperkeratotic lesions with chisel without incident.   -heel crack covered with neosporin and bandaid. Advised to moisturize well.  -Answered all patient questions -Patient to return  in 9 weeks  for at risk foot care -Patient advised to call the office if any problems or questions arise in the meantime.   Asberry Failing, DPM

## 2023-03-22 DIAGNOSIS — M5416 Radiculopathy, lumbar region: Secondary | ICD-10-CM | POA: Diagnosis not present

## 2023-03-22 DIAGNOSIS — M47816 Spondylosis without myelopathy or radiculopathy, lumbar region: Secondary | ICD-10-CM | POA: Diagnosis not present

## 2023-03-22 DIAGNOSIS — M4807 Spinal stenosis, lumbosacral region: Secondary | ICD-10-CM | POA: Diagnosis not present

## 2023-03-22 DIAGNOSIS — Z79891 Long term (current) use of opiate analgesic: Secondary | ICD-10-CM | POA: Diagnosis not present

## 2023-03-25 DIAGNOSIS — H04123 Dry eye syndrome of bilateral lacrimal glands: Secondary | ICD-10-CM | POA: Diagnosis not present

## 2023-03-25 DIAGNOSIS — H16223 Keratoconjunctivitis sicca, not specified as Sjogren's, bilateral: Secondary | ICD-10-CM | POA: Diagnosis not present

## 2023-03-25 DIAGNOSIS — H0288A Meibomian gland dysfunction right eye, upper and lower eyelids: Secondary | ICD-10-CM | POA: Diagnosis not present

## 2023-03-25 DIAGNOSIS — H0288B Meibomian gland dysfunction left eye, upper and lower eyelids: Secondary | ICD-10-CM | POA: Diagnosis not present

## 2023-04-07 ENCOUNTER — Ambulatory Visit: Payer: Medicare Other | Admitting: Podiatry

## 2023-04-08 ENCOUNTER — Encounter: Payer: Self-pay | Admitting: Family Medicine

## 2023-04-11 ENCOUNTER — Other Ambulatory Visit: Payer: Self-pay

## 2023-04-11 DIAGNOSIS — E1140 Type 2 diabetes mellitus with diabetic neuropathy, unspecified: Secondary | ICD-10-CM

## 2023-04-13 ENCOUNTER — Telehealth: Payer: Self-pay

## 2023-04-13 NOTE — Progress Notes (Signed)
Care Guide Pharmacy Note  04/13/2023 Name: Kristina Patel MRN: [id number] DOB: 1946-01-06  Referred By: Sharlene Dory, DO Reason for referral: Care Coordination (Outreach to schedule with Pharm d )   Kristina Patel is a 78 y.o. year old female who is a primary care patient of Sharlene Dory, DO.  Kristina Patel was referred to the pharmacist for assistance related to: DMII  Successful contact was made with the patient to discuss pharmacy services including being ready for the pharmacist to call at least 5 minutes before the scheduled appointment time and to have medication bottles and any blood pressure readings ready for review. The patient agreed to meet with the pharmacist via telephone visit on (date/time).04/25/2023  Penne Lash , RMA     [place]  [facility], [facility] Guide  Direct Dial: [phone]  Website: [email or web address]

## 2023-04-13 NOTE — Progress Notes (Signed)
Care Guide Pharmacy Note  04/13/2023 Name: Kristina Patel MRN: [id number] DOB: [birth year]  Referred By: Sharlene Dory, DO Reason for referral: Care Coordination (Outreach to schedule with Pharm d )   Kristina Patel is a 78 y.o. year old female who is a primary care patient of Sharlene Dory, DO.  Kristina Patel was referred to the pharmacist for assistance related to: DMII  An unsuccessful telephone outreach was attempted today to contact the patient who was referred to the pharmacy team for assistance with medication assistance. Additional attempts will be made to contact the patient.  Penne Lash , RMA     [facility]  [facility], [facility] Guide  Direct Dial: [phone]  Website: [email or web address]

## 2023-04-19 DIAGNOSIS — M25511 Pain in right shoulder: Secondary | ICD-10-CM | POA: Diagnosis not present

## 2023-04-19 DIAGNOSIS — R479 Unspecified speech disturbances: Secondary | ICD-10-CM | POA: Diagnosis not present

## 2023-04-20 DIAGNOSIS — R479 Unspecified speech disturbances: Secondary | ICD-10-CM | POA: Diagnosis not present

## 2023-04-20 DIAGNOSIS — G8929 Other chronic pain: Secondary | ICD-10-CM | POA: Diagnosis not present

## 2023-04-20 DIAGNOSIS — I440 Atrioventricular block, first degree: Secondary | ICD-10-CM | POA: Diagnosis not present

## 2023-04-20 DIAGNOSIS — M25511 Pain in right shoulder: Secondary | ICD-10-CM | POA: Diagnosis not present

## 2023-04-25 ENCOUNTER — Ambulatory Visit (INDEPENDENT_AMBULATORY_CARE_PROVIDER_SITE_OTHER): Payer: No Typology Code available for payment source | Admitting: Pharmacist

## 2023-04-25 DIAGNOSIS — E1140 Type 2 diabetes mellitus with diabetic neuropathy, unspecified: Secondary | ICD-10-CM

## 2023-04-25 NOTE — Progress Notes (Signed)
 04/25/2023 Name: ALFONSO SHACKETT MRN: [id number] DOB: [birth year]  No chief complaint on file.   Kristina Patel is a 78 y.o. year old female who presented for a telephone visit.   They were referred to the pharmacist by their PCP for assistance in managing medication access.    Subjective:  Called patient to get insurance information and discuss medication assistance program.  She was at the salon and could only speak for a short time.    Medication Access/Adherence  Current Pharmacy:  [id number] - [place] - [address] [place] [address] Phone: [phone] Fax: [phone]  [facility] [place] - [facility] [address] [place] [address] Phone: [phone] Fax: [phone]   Patient reports affordability concerns with their medications: Yes  Patient reports access/transportation concerns to their pharmacy: No  Patient reports adherence concerns with their medications:  No       Objective:  Lab Results  Component Value Date   HGBA1C 6.1 12/09/2021    Lab Results  Component Value Date   CREATININE 1.10 12/09/2021   BUN 14 12/09/2021   NA 139 12/09/2021   K 4.7 12/09/2021   CL 107 12/09/2021   CO2 23 12/09/2021    Lab Results  Component Value Date   CHOL 199 12/09/2021   HDL 50.10 12/09/2021   LDLCALC 76 11/15/2011   LDLDIRECT 119.0 12/09/2021   TRIG 216.0 (H) 12/09/2021   CHOLHDL 4 12/09/2021    Medications Reviewed Today     Reviewed by Henrene Pastor, RPH-CPP (Pharmacist) on 04/25/23 at 1312  Med List Status: <None>   Medication Order Taking? Sig Documenting Provider Last Dose Status Informant  acetaminophen (TYLENOL) 325 MG tablet [id number] No Take 325 mg by mouth every 6 (six) hours as needed for headache (pain). [provider] Taking Active Self  ACIDOPHILUS LACTOBACILLUS PO [id number] No Take 2 tablets by mouth 3 (three)  times daily with meals.  [provider] Taking Active Self  ALPRAZolam (XANAX) 0.5 MG tablet [id number]  Take 1 tablet (0.5 mg total) by mouth every 12 (twelve) hours as needed. Sharlene Dory, DO  Active   aspirin-acetaminophen-caffeine (EXCEDRIN MIGRAINE) [phone] MG tablet [id number] No Take 1 tablet by mouth daily as needed for headache or migraine. [provider] Taking Active Self  atorvastatin (LIPITOR) 10 MG tablet [id number] No Take 10 mg by mouth daily.  [provider] Taking Active   calcium-vitamin D (OSCAL WITH D) 500-200 MG-UNIT TABS tablet [id number] No Take 1 tablet by mouth every evening. [provider] Taking Active   Certolizumab Pegol (CIMZIA PREFILLED) 2 X 200 MG/ML PSKT [id number] No Inject into the skin. [provider] Taking Active   Cholecalciferol (D2000 ULTRA STRENGTH) 50 MCG (2000 UT) CAPS [id number] No Take by mouth. [provider] Taking Active   cyanocobalamin 1000 MCG tablet [id number] No Take 1,000 mcg by mouth daily. [provider] Taking Active   cyclobenzaprine (FLEXERIL) 10 MG tablet [id number]  Take 1 tablet (10 mg total) by mouth 3 (three) times daily as needed for muscle spasms. Sharlene Dory, DO  Active   diclofenac Sodium (VOLTAREN) 1 % GEL [id number] No Apply 2 g topically 4 (four) times daily. Vivi Barrack, DPM Taking Active   diphenhydrAMINE (BENADRYL) 25 mg capsule [id number] No Take by mouth. [provider] Taking Active   esomeprazole (NEXIUM) 40 MG capsule [id number] No  Take by mouth. [provider] Taking Active   famotidine (PEPCID) 20 MG tablet [id number]  TAKE 1 TABLET BY MOUTH AT BEDTIME Sharlene Dory, DO  Active   fenofibrate (TRICOR) 48 MG tablet [id number] No TAKE ONE TABLET BY MOUTH DAILY Sharlene Dory, DO Taking Active   Flaxseed, Linseed, (FLAXSEED OIL) 1000 MG CAPS [id number] No Take 1,000 mg by mouth daily with supper.   [provider] Taking Active Self  leflunomide (ARAVA) 10 MG tablet [id number] No Take 1 tablet by mouth daily. [provider] Taking Active   lidocaine (XYLOCAINE) 2 % jelly [id number] No 1 application as needed. [provider] Taking Active   Magnesium Oxide 420 MG TABS [id number] No Take 1 tablet by mouth daily. [provider] Taking Active   metFORMIN (GLUCOPHAGE) 500 MG tablet [id number] No Take 1 tablet (500 mg total) by mouth 2 (two) times daily with a meal. Sharlene Dory, DO Taking Active   metoprolol tartrate (LOPRESSOR) 50 MG tablet [id number] No Take by mouth. [provider] Taking Active   montelukast (SINGULAIR) 10 MG tablet [id number] No Take 1 tablet (10 mg total) by mouth at bedtime. Sharlene Dory, DO Taking Active   Multiple Vitamin (MULTIVITAMIN WITH MINERALS) TABS tablet [id number] No Take 1 tablet by mouth daily with supper. [provider] Taking Active Self  nystatin (MYCOSTATIN/NYSTOP) powder [id number] No Apply topically. [provider] Taking Active   ondansetron (ZOFRAN-ODT) 4 MG disintegrating tablet [id number]  PLACE 1 TABLET BY MOUTH EVERY 8 HOURS AS NEEDED FOR NAUSEA AND/OR VOMITING Wendling, Jilda Roche, DO  Active   oxyCODONE-acetaminophen (PERCOCET/ROXICET) 5-325 MG tablet [id number] No Take 1 tablet by mouth every 6 (six) hours as needed for severe pain. Terrilee Files, MD Taking Active   pantoprazole (PROTONIX) 40 MG tablet [id number] No Take 40 mg by mouth daily. [provider] Taking Active   Semaglutide,0.25 or 0.5MG /DOS, (OZEMPIC, 0.25 OR 0.5 MG/DOSE,) 2 MG/3ML SOPN [id number]  Inject 0.5 mg into the skin once a week. Sharlene Dory, DO  Active   sertraline (ZOLOFT) 100 MG tablet [id number] No Take 100 mg by mouth daily. [provider] Taking Active   topiramate (TOPAMAX) 25 MG tablet [id number] No Take 2 tablets (50 mg total) by mouth 2 (two) times daily.  Micki Riley, MD Taking Active Self  triamcinolone acetonide (TRIESENCE) 40 MG/ML SUSP [id number] No Inject into the articular space. [provider] Taking Active   triamcinolone cream (KENALOG) 0.1 % [id number] No Apply 1 application topically 2 (two) times daily. Vivi Barrack, DPM Taking Active   TURMERIC PO [id number] No Take 900 mg by mouth 3 (three) times daily with meals. [provider] Taking Active Self  Med List Note Gorden Harms, CPhT 01/13/18 2035): [facility] Sharper              Assessment/Plan:   Patient might be eligible for assistance for Ozempic from [employer] but she has Orthoptist. Per patient [employer] dose not cover Ozempic for her.  Since patient was only able to speak for a short time. Will call back in a few days to try to complete [employer].     Henrene Pastor, PharmD Clinical Pharmacist [facility]

## 2023-04-28 DIAGNOSIS — M67813 Other specified disorders of tendon, right shoulder: Secondary | ICD-10-CM | POA: Diagnosis not present

## 2023-04-28 DIAGNOSIS — M25512 Pain in left shoulder: Secondary | ICD-10-CM | POA: Diagnosis not present

## 2023-05-04 ENCOUNTER — Encounter: Payer: Self-pay | Admitting: Podiatry

## 2023-05-05 ENCOUNTER — Ambulatory Visit: Payer: Medicare Other | Admitting: Podiatry

## 2023-05-06 LAB — HEMOGLOBIN A1C: Hemoglobin A1C: 8

## 2023-05-11 ENCOUNTER — Telehealth: Payer: Self-pay | Admitting: Pharmacist

## 2023-05-11 NOTE — Telephone Encounter (Signed)
 Attempt was made to contact patient by phone today for follow up by Clinical Pharmacist regarding patient assistance program for Ozempic Unable to reach patient. LM on VM with my contact number [phone].

## 2023-05-12 NOTE — Telephone Encounter (Signed)
 Pt called back to return call. I transferred the patient to your direct line as listed.

## 2023-05-13 ENCOUNTER — Other Ambulatory Visit: Payer: Self-pay | Admitting: Pharmacist

## 2023-05-13 ENCOUNTER — Other Ambulatory Visit: Payer: Self-pay | Admitting: Family Medicine

## 2023-05-13 DIAGNOSIS — U071 COVID-19: Secondary | ICD-10-CM

## 2023-05-13 NOTE — Progress Notes (Signed)
 05/13/2023 Name: Kristina Patel MRN: [id number] DOB: [birth year]  Chief Complaint  Patient presents with   Medication Management   Diabetes    Kristina Patel is a 78 y.o. year old female who presented for a telephone visit.   They were referred to the pharmacist by their PCP for assistance in managing medication access.    Subjective:  Medication Access/Adherence  Current Pharmacy:  [id number] - [place] - [address] [place] [address] Phone: [phone] Fax: [phone]  [facility] [place] - [facility] [address] [place] [address] Phone: [phone] Fax: [phone]   Patient reports affordability concerns with their medications: Yes  - states cost of Ozempic is high. Not covered by her [facility] plan but is covered by [facility] Medicare Patient reports access/transportation concerns to their pharmacy: No  Patient reports adherence concerns with their medications:  No       Objective:  Lab Results  Component Value Date   HGBA1C 6.1 12/09/2021    Lab Results  Component Value Date   CREATININE 1.10 12/09/2021   BUN 14 12/09/2021   NA 139 12/09/2021   K 4.7 12/09/2021   CL 107 12/09/2021   CO2 23 12/09/2021    Lab Results  Component Value Date   CHOL 199 12/09/2021   HDL 50.10 12/09/2021   LDLCALC 76 11/15/2011   LDLDIRECT 119.0 12/09/2021   TRIG 216.0 (H) 12/09/2021   CHOLHDL 4 12/09/2021    Medications Reviewed Today     Reviewed by Henrene Pastor, RPH-CPP (Pharmacist) on 05/13/23 at 1609  Med List Status: <None>   Medication Order Taking? Sig Documenting Provider Last Dose Status Informant  acetaminophen (TYLENOL) 325 MG tablet [id number] No Take 325 mg by mouth every 6 (six) hours as needed for headache (pain). [provider] Taking Active Self  ACIDOPHILUS LACTOBACILLUS PO [id number] No Take 2 tablets by mouth 3 (three) times daily  with meals.  [provider] Taking Active Self  ALPRAZolam (XANAX) 0.5 MG tablet [id number]  Take 1 tablet (0.5 mg total) by mouth every 12 (twelve) hours as needed. Sharlene Dory, DO  Active   aspirin-acetaminophen-caffeine (EXCEDRIN MIGRAINE) [phone] MG tablet [id number] No Take 1 tablet by mouth daily as needed for headache or migraine. [provider] Taking Active Self  atorvastatin (LIPITOR) 10 MG tablet [id number] No Take 10 mg by mouth daily.  [provider] Taking Active   calcium-vitamin D (OSCAL WITH D) 500-200 MG-UNIT TABS tablet [id number] No Take 1 tablet by mouth every evening. [provider] Taking Active   Certolizumab Pegol (CIMZIA PREFILLED) 2 X 200 MG/ML PSKT [id number] No Inject into the skin. [provider] Taking Active   Cholecalciferol (D2000 ULTRA STRENGTH) 50 MCG (2000 UT) CAPS [id number] No Take by mouth. [provider] Taking Active   cyanocobalamin 1000 MCG tablet [id number] No Take 1,000 mcg by mouth daily. [provider] Taking Active   cyclobenzaprine (FLEXERIL) 10 MG tablet [id number]  Take 1 tablet (10 mg total) by mouth 3 (three) times daily as needed for muscle spasms. Sharlene Dory, DO  Active   diclofenac Sodium (VOLTAREN) 1 % GEL [id number] No Apply 2 g topically 4 (four) times daily. Vivi Barrack, DPM Taking Active   diphenhydrAMINE (BENADRYL) 25 mg capsule [id number] No Take by mouth. [provider] Taking Active   esomeprazole (NEXIUM) 40 MG capsule [id number] No  Take by mouth. [provider] Taking Active   famotidine (PEPCID) 20 MG tablet [id number]  TAKE 1 TABLET BY MOUTH AT BEDTIME Sharlene Dory, DO  Active   fenofibrate (TRICOR) 48 MG tablet [id number] No TAKE ONE TABLET BY MOUTH DAILY Sharlene Dory, DO Taking Active   Flaxseed, Linseed, (FLAXSEED OIL) 1000 MG CAPS [id number] No Take 1,000 mg by mouth daily with supper.  [provider] Taking Active Self  leflunomide (ARAVA) 10 MG tablet [id number] No Take 1 tablet by mouth daily. [provider] Taking Active   lidocaine (XYLOCAINE) 2 % jelly [id number] No 1 application as needed. [provider] Taking Active   Magnesium Oxide 420 MG TABS [id number] No Take 1 tablet by mouth daily. [provider] Taking Active   metFORMIN (GLUCOPHAGE) 500 MG tablet [id number] No Take 1 tablet (500 mg total) by mouth 2 (two) times daily with a meal. Sharlene Dory, DO Taking Active   metoprolol tartrate (LOPRESSOR) 50 MG tablet [id number] No Take by mouth. [provider] Taking Active   montelukast (SINGULAIR) 10 MG tablet [id number] No Take 1 tablet (10 mg total) by mouth at bedtime. Sharlene Dory, DO Taking Active   Multiple Vitamin (MULTIVITAMIN WITH MINERALS) TABS tablet [id number] No Take 1 tablet by mouth daily with supper. [provider] Taking Active Self  nystatin (MYCOSTATIN/NYSTOP) powder [id number] No Apply topically. [provider] Taking Active   ondansetron (ZOFRAN-ODT) 4 MG disintegrating tablet [id number]  PLACE 1 TABLET BY MOUTH EVERY 8 HOURS AS NEEDED FOR NAUSEA AND/OR VOMITING Wendling, Jilda Roche, DO  Active   oxyCODONE-acetaminophen (PERCOCET/ROXICET) 5-325 MG tablet [id number] No Take 1 tablet by mouth every 6 (six) hours as needed for severe pain. Terrilee Files, MD Taking Active   pantoprazole (PROTONIX) 40 MG tablet [id number] No Take 40 mg by mouth daily. [provider] Taking Active   Semaglutide,0.25 or 0.5MG /DOS, (OZEMPIC, 0.25 OR 0.5 MG/DOSE,) 2 MG/3ML SOPN [id number]  Inject 0.5 mg into the skin once a week. Sharlene Dory, DO  Active   sertraline (ZOLOFT) 100 MG tablet [id number] No Take 100 mg by mouth daily. [provider] Taking Active   topiramate (TOPAMAX) 25 MG tablet [id number] No Take 2 tablets (50 mg total) by mouth 2 (two) times daily. Micki Riley, MD Taking Active Self  triamcinolone acetonide (TRIESENCE) 40 MG/ML SUSP [id number] No Inject into the articular space. [provider] Taking Active   triamcinolone cream (KENALOG) 0.1 % [id number] No Apply 1 application topically 2 (two) times daily. Vivi Barrack, DPM Taking Active   TURMERIC PO [id number] No Take 900 mg by mouth 3 (three) times daily with meals. [provider] Taking Active Self  Med List Note Gorden Harms, CPhT 01/13/18 2035): [facility] Sharper              Assessment/Plan:  Medication Access:  - Screened for patient assistance program for Ozempic. Since she is ineligible to get Ozempic form [facility] she does qualify for [employer] patient assistance program. Completed [employer] patient assistance program today. Patient was approved.  Will get PCP to review and sign application Monday 3/24 when in the office again.  - Will then try to request voucher since patint states she only had 1 dose of Ozempic remaining.    Follow up : 2 to 4 weeks to follow up on patient assistance program.    Henrene Pastor, PharmD Clinical Pharmacist [facility]

## 2023-05-22 ENCOUNTER — Encounter: Payer: Self-pay | Admitting: Family Medicine

## 2023-05-24 ENCOUNTER — Telehealth: Payer: Self-pay

## 2023-05-24 NOTE — Telephone Encounter (Signed)
 Patient called, left VM to return the call to the office.  Copied from CRM [id number]. Topic: Clinical - Medication Question >> May 24, 2023  2:25 PM Adrionna Y wrote: Reason for CRM: Patient is looking to talk about a medication she was being prescribed. Would like a call back

## 2023-05-25 ENCOUNTER — Telehealth: Payer: Self-pay

## 2023-05-25 NOTE — Telephone Encounter (Signed)
 Pt was calling about Ozempic.  Message sent to Tammy.

## 2023-05-25 NOTE — Telephone Encounter (Signed)
 Copied from CRM [id number]. Topic: Clinical - Medication Question >> May 25, 2023 12:27 PM Sonny Dandy B wrote: Reason for CRM:pt called to speak with tammy the pharmacist. Pt is requesting a call back at [phone]

## 2023-05-25 NOTE — Telephone Encounter (Signed)
 Spoke with patient she wanted to speak about the patient assistance program for Ozempic.  She received some paperwork in the mail and was unsure if she needed to fill it out again.

## 2023-05-26 NOTE — Telephone Encounter (Signed)
 Completed application on line with patient 05/13/2023 and was approved. Provider portion was faxed to [employer] 05/16/2023.  Patient should not need to fill out any additional paperwork.  [employer] to check on application status. Spoke to representative who states that application was incomplete per her notes but she reviewed paperwork and per rep it looked like all the needed information was available.  Representative reviewed application while on the phone and approved.  Enrollment end date 02/22/2024 Patient ID # [id number]  [employer] is processing order for Ozempic 0.5mg . Could take 10 to 14 business days - expected date to receive medication is by April 23rd, 2025.  Tried to call patient back but no answer - Left message about above for patient. If she is out or close to being out of Ozempic she is to call me. Will check in the office to see if we have a sample to get her thru until we receive supply from medication assistance program.

## 2023-06-01 ENCOUNTER — Ambulatory Visit (INDEPENDENT_AMBULATORY_CARE_PROVIDER_SITE_OTHER): Admitting: Pharmacist

## 2023-06-01 DIAGNOSIS — E1140 Type 2 diabetes mellitus with diabetic neuropathy, unspecified: Secondary | ICD-10-CM

## 2023-06-01 NOTE — Progress Notes (Signed)
 06/01/2023 Name: Kristina Patel MRN: [id number] DOB: 09-07-1945  Chief Complaint  Patient presents with   Diabetes   Medication Management    TRINNA KUNST is a 78 y.o. year old female who presented for a telephone visit.   They were referred to the pharmacist by their PCP for assistance in managing medication access.    Subjective:  Medication Access/Adherence  Current Pharmacy:  [id number] - [place] - [address] [place] [address] Phone: [phone] Fax: [phone]  [facility] [place] - [facility] [address] [place] [address] Phone: [phone] Fax: [phone]   Patient reports affordability concerns with their medications: Yes  - states cost of Ozempic is high. Not covered by her VA plan  Patient reports access/transportation concerns to their pharmacy: No  Patient reports adherence concerns with their medications:  No      Patient is taking Ozmepic 0.5mg  weekly.  Recently home blood glucose readings have been 80 to 100.    Objective:  Lab Results  Component Value Date   HGBA1C 6.1 12/09/2021    Lab Results  Component Value Date   CREATININE 1.10 12/09/2021   BUN 14 12/09/2021   NA 139 12/09/2021   K 4.7 12/09/2021   CL 107 12/09/2021   CO2 23 12/09/2021    Lab Results  Component Value Date   CHOL 199 12/09/2021   HDL 50.10 12/09/2021   LDLCALC 76 11/15/2011   LDLDIRECT 119.0 12/09/2021   TRIG 216.0 (H) 12/09/2021   CHOLHDL 4 12/09/2021    Medications Reviewed Today     Reviewed by Henrene Pastor, RPH-CPP (Pharmacist) on 06/01/23 at 1409  Med List Status: <None>   Medication Order Taking? Sig Documenting Provider Last Dose Status Informant  acetaminophen (TYLENOL) 325 MG tablet [id number] No Take 325 mg by mouth every 6 (six) hours as needed for headache (pain). [provider] Taking Active Self  ACIDOPHILUS  LACTOBACILLUS PO [id number] No Take 2 tablets by mouth 3 (three) times daily with meals.  [provider] Taking Active Self  ALPRAZolam (XANAX) 0.5 MG tablet [id number]  Take 1 tablet (0.5 mg total) by mouth every 12 (twelve) hours as needed. Sharlene Dory, DO  Active   aspirin-acetaminophen-caffeine (EXCEDRIN MIGRAINE) [phone] MG tablet [id number] No Take 1 tablet by mouth daily as needed for headache or migraine. [provider] Taking Active Self  atorvastatin (LIPITOR) 10 MG tablet [id number] No Take 10 mg by mouth daily.  [provider] Taking Active   calcium-vitamin D (OSCAL WITH D) 500-200 MG-UNIT TABS tablet [id number] No Take 1 tablet by mouth every evening. [provider] Taking Active   Certolizumab Pegol (CIMZIA PREFILLED) 2 X 200 MG/ML PSKT [id number] No Inject into the skin. [provider] Taking Active   Cholecalciferol (D2000 ULTRA STRENGTH) 50 MCG (2000 UT) CAPS [id number] No Take by mouth. [provider] Taking Active   cyanocobalamin 1000 MCG tablet [id number] No Take 1,000 mcg by mouth daily. [provider] Taking Active   cyclobenzaprine (FLEXERIL) 10 MG tablet [id number]  Take 1 tablet (10 mg total) by mouth 3 (three) times daily as needed for muscle spasms. Sharlene Dory, DO  Active   diclofenac Sodium (VOLTAREN) 1 % GEL [id number] No Apply 2 g topically 4 (four) times daily. Vivi Barrack, DPM Taking Active   diphenhydrAMINE (BENADRYL) 25 mg capsule [id number] No Take by mouth.  [provider] Taking Active   esomeprazole (NEXIUM) 40 MG capsule [id number] No Take by mouth. [provider] Taking Active   famotidine (PEPCID) 20 MG tablet [id number]  TAKE 1 TABLET BY MOUTH AT BEDTIME Sharlene Dory, DO  Active   fenofibrate (TRICOR) 48 MG tablet [id number] No TAKE ONE TABLET BY MOUTH DAILY Sharlene Dory, DO Taking Active   Flaxseed, Linseed, (FLAXSEED OIL) 1000  MG CAPS [id number] No Take 1,000 mg by mouth daily with supper.  [provider] Taking Active Self  leflunomide (ARAVA) 10 MG tablet [id number] No Take 1 tablet by mouth daily. [provider] Taking Active   lidocaine (XYLOCAINE) 2 % jelly [id number] No 1 application as needed. [provider] Taking Active   Magnesium Oxide 420 MG TABS [id number] No Take 1 tablet by mouth daily. [provider] Taking Active   metFORMIN (GLUCOPHAGE) 500 MG tablet [id number] No Take 1 tablet (500 mg total) by mouth 2 (two) times daily with a meal. Sharlene Dory, DO Taking Active   metoprolol tartrate (LOPRESSOR) 50 MG tablet [id number] No Take by mouth. [provider] Taking Active   montelukast (SINGULAIR) 10 MG tablet [id number] No Take 1 tablet (10 mg total) by mouth at bedtime. Sharlene Dory, DO Taking Active   Multiple Vitamin (MULTIVITAMIN WITH MINERALS) TABS tablet [id number] No Take 1 tablet by mouth daily with supper. [provider] Taking Active Self  nystatin (MYCOSTATIN/NYSTOP) powder [id number] No Apply topically. [provider] Taking Active   ondansetron (ZOFRAN-ODT) 4 MG disintegrating tablet [id number]  PLACE 1 TABLET BY MOUTH EVERY 8 HOURS AS NEEDED FOR NAUSEA AND/OR VOMITING Wendling, Jilda Roche, DO  Active   oxyCODONE-acetaminophen (PERCOCET/ROXICET) 5-325 MG tablet [id number] No Take 1 tablet by mouth every 6 (six) hours as needed for severe pain. Terrilee Files, MD Taking Active   pantoprazole (PROTONIX) 40 MG tablet [id number] No Take 40 mg by mouth daily. [provider] Taking Active   Semaglutide,0.25 or 0.5MG /DOS, (OZEMPIC, 0.25 OR 0.5 MG/DOSE,) 2 MG/3ML SOPN [id number]  Inject 0.5 mg into the skin once a week. Sharlene Dory, DO  Active   sertraline (ZOLOFT) 100 MG tablet [id number] No Take 100 mg by mouth daily. [provider] Taking Active   topiramate (TOPAMAX) 25 MG tablet [id number]  No Take 2 tablets (50 mg total) by mouth 2 (two) times daily. Micki Riley, MD Taking Active Self  triamcinolone acetonide (TRIESENCE) 40 MG/ML SUSP [id number] No Inject into the articular space. [provider] Taking Active   triamcinolone cream (KENALOG) 0.1 % [id number] No Apply 1 application topically 2 (two) times daily. Vivi Barrack, DPM Taking Active   TURMERIC PO [id number] No Take 900 mg by mouth 3 (three) times daily with meals. [provider] Taking Active Self  Med List Note Gorden Harms, CPhT 01/13/18 2035): [facility]per              Assessment/Plan:  Medication Access:  - Patient has been approved to received Ozempic from [employer] medication assistance program thru 02/22/2024. Expected date of first delivery is round 06/15/2023. She confirms she has filled Ozempic at [facility] to get her thru until she received medication assistance program delivery.   Follow up : 2 to 4 weeks to follow up on patient assistance program delivery   Henrene Pastor, PharmD Clinical Pharmacist [facility]

## 2023-06-13 ENCOUNTER — Telehealth: Payer: Self-pay

## 2023-06-13 NOTE — Telephone Encounter (Signed)
 Copied from CRM [id number]. Topic: Clinical - Medication Question >> Jun 09, 2023 12:05 PM Orien Bird wrote: Reason for CRM: Patient called stating she talk to someone from the clinic to pick up some ozempic  >> Jun 13, 2023 12:28 PM Caliyah H wrote: Follow-up from message on 06/09/2023: Patient called this morning stating her Ozempic  has been delivered to the office and is inquiring about the next steps.  Callback Number: [phone]  Called pt back was advised the Ozempic  is here  For pick up. Pt stated understand.

## 2023-06-14 ENCOUNTER — Telehealth: Payer: Self-pay

## 2023-06-14 NOTE — Telephone Encounter (Signed)
 Called pt Kristina Patel letting her know we received her Ozempic  0.25/0.5mg  medication and is here ready for pickup. Spoke with Cecilie Coffee, RPH-CPP and stated the other 2 boxes from yesterday was the samples.

## 2023-06-14 NOTE — Telephone Encounter (Signed)
 Shipment of Ozempic  0.25/0.5mg  weekly was received today per [employer] - he wanted to check to make sure this was correct because patient picked up #1 box yesterday which was a sample to get her thru until patient assistance program delivery was received. We were not sure if patient assistance program delivery would be received before her next dose.

## 2023-06-20 ENCOUNTER — Ambulatory Visit (INDEPENDENT_AMBULATORY_CARE_PROVIDER_SITE_OTHER): Admitting: Pharmacist

## 2023-06-20 ENCOUNTER — Telehealth: Payer: Self-pay

## 2023-06-20 DIAGNOSIS — E782 Mixed hyperlipidemia: Secondary | ICD-10-CM

## 2023-06-20 DIAGNOSIS — Z79891 Long term (current) use of opiate analgesic: Secondary | ICD-10-CM | POA: Diagnosis not present

## 2023-06-20 DIAGNOSIS — Z5181 Encounter for therapeutic drug level monitoring: Secondary | ICD-10-CM | POA: Diagnosis not present

## 2023-06-20 DIAGNOSIS — E1140 Type 2 diabetes mellitus with diabetic neuropathy, unspecified: Secondary | ICD-10-CM

## 2023-06-20 DIAGNOSIS — G894 Chronic pain syndrome: Secondary | ICD-10-CM | POA: Diagnosis not present

## 2023-06-20 DIAGNOSIS — M47816 Spondylosis without myelopathy or radiculopathy, lumbar region: Secondary | ICD-10-CM | POA: Diagnosis not present

## 2023-06-20 NOTE — Patient Instructions (Signed)
 Recommend that you re-consider taking atorvasatin or other statin due to benefit in patient's who have type 2 diabetes:   Statins and Diabetes: What You Should Know  People with diabetes are twice as likely to have heart disease or a stroke. Statins can reduce that risk by lowering cholesterol. Statins may raise blood sugar, but the benefits for many outweigh that risk. medicine and stethoscope on green marble background  Why are statins prescribed? Heart disease is the leading cause of death for people in the [place] . People with diabetes are twice as likely to have heart disease or a stroke compared to people without diabetes. The longer you have diabetes, the more likely you are to have heart disease. This is because over time high blood sugar can damage the blood vessels and nerves that control your heart.  A common cause of heart disease is plaque (cholesterol deposits) that builds up in the arteries. When plaque continues to build, your arteries narrow, making it harder for blood to flow to your heart. This can cause heart muscles to weaken and increases the risk of heart attack and stroke.  For this reason, your doctor may prescribe a statin (cholesterol-lowering medicine) to reduce your risk of heart disease. About half of people who take medicine to manage high blood cholesterol take a statin.  How do statins work? Statins are a type of medicine that reduces the amount of cholesterol made in the liver. Statins also help remove LDL ("bad") cholesterol that's already in the blood and raise your HDL ("good") cholesterol. They can also:  Reduce the buildup of plaque on artery walls. Stabilize plaque so that it doesn't break off and block blood flow to the heart or brain. Decrease swelling in artery walls. Lower the chance of blood clots forming.  There are several types of statins, each with different dosage levels and strengths. A statin prescription will be based on your  individual factors. These include your blood cholesterol levels, your risk for heart disease, and your tolerance of a specific statin. Your health care team will work with you to determine the best type and dosage.   What else can I do to protect my heart? Having healthy cholesterol and blood sugar levels are important to lower your risk of heart disease.  Although statins help reduce your risk of heart disease, healthy lifestyle habits are an important part of reducing your risk too.

## 2023-06-20 NOTE — Progress Notes (Signed)
   06/20/2023 Name: ASAYO BUCKALEW MRN: [id number] DOB: 1945-11-06  Chief Complaint  Patient presents with   Medication Management    GENAVIVE MELIKIAN is a 78 y.o. year old female who presented for a telephone visit.   They were referred to the pharmacist by their PCP for assistance in managing medication access.    Subjective:  Medication Access/Adherence  Current Pharmacy:  [id number] - [place], [place] - [address] [place] [address] Phone: [phone] Fax: [phone]  [facility] [place] [facility] [address], [address] [place] [address] Phone: [phone] Fax: [phone]   Patient reports affordability concerns with their medications: Yes  - states cost of Ozempic  is high. Not covered by her [facility] plan  Patient reports access/transportation concerns to their pharmacy: No  Patient reports adherence concerns with their medications:  No      Patient is taking Ozmepic 0.5mg  weekly.  Recent home blood glucose readings have been 80 to 100.   Noted patient had marked atorvastatin to be removed from her medication list. Last lipids checked 11/2021   Objective:  Lab Results  Component Value Date   HGBA1C 6.1 12/09/2021    Lab Results  Component Value Date   CREATININE 1.10 12/09/2021   BUN 14 12/09/2021   NA 139 12/09/2021   K 4.7 12/09/2021   CL 107 12/09/2021   CO2 23 12/09/2021    Lab Results  Component Value Date   CHOL 199 12/09/2021   HDL 50.10 12/09/2021   LDLCALC 76 11/15/2011   LDLDIRECT 119.0 12/09/2021   TRIG 216.0 (H) 12/09/2021   CHOLHDL 4 12/09/2021    Medications Reviewed Today   Medications were not reviewed in this encounter       Assessment/Plan:  Medication Access:  - Patient has been approved to received Ozempic  from [employer] medication assistance program thru 02/22/2024. Her first shipment has been received.    Hyperlipidemia:  - Provided patient with written information regarding benefits of statins / atorvastatin in patients with type 2 DM in prevention of cardiovascular disease.   - recommended she restart atorvastatin 10mg  daily. Also placed note in PCP office visit for May 2025 to discuss further.   Cecilie Coffee, PharmD Clinical Pharmacist [facility]

## 2023-06-20 NOTE — Telephone Encounter (Signed)
 Patient called. She reports she received a box of Mounjaro  2.5mg  last week. Not sure is maybe she was given Mounjaro  by mistake or if we only had a sample for Mounjaro  (no Ozempic ) so she was provided with Mounjaro  sample to get her thru until patient assistance program delivery received from [employer].  She is supposed to be taking Ozempic  0.5mg  weekly - and just received #4 boxes from patient assistance program. There is no patient assistance program program for Mounjaro  at this time.  Patient voices understanding.

## 2023-06-20 NOTE — Telephone Encounter (Signed)
 Please Advise Copied from CRM [id number]. Topic: Clinical - Prescription Issue >> Jun 20, 2023  4:14 PM Star East wrote: Reason for CRM: Patient states she takes monjauro 2.5 mg but was given ozempic  by the pharmacy today when she sent her husband to pick it up- please call to advise [phone]

## 2023-06-25 ENCOUNTER — Other Ambulatory Visit: Payer: Self-pay | Admitting: Family Medicine

## 2023-06-25 DIAGNOSIS — U071 COVID-19: Secondary | ICD-10-CM

## 2023-07-11 ENCOUNTER — Ambulatory Visit: Payer: No Typology Code available for payment source | Admitting: Family Medicine

## 2023-07-14 ENCOUNTER — Encounter: Payer: Self-pay | Admitting: Pharmacist

## 2023-07-14 NOTE — Progress Notes (Signed)
 Pharmacy Quality Measure Review  This patient is appearing on a report for being at risk of failing the Glycemic Status Assessment in Diabetes measure this calendar year.   Our office has nto checked A1c since 2023. Patient also is seen at [facility] clinic in [place]. A1c was checked 05/06/2023 - noted to be 8.0%  Patient is currently taking Ozempic  0.5mg  weekly and metformin  500mg  twice a day.   Updated our lab records with last A1c from [place] clinic.  Tried to out reach patient to discuss home blood glucose and consider if increase dose in either Ozempic  or metformin  is needed.  Unable to reach patient but LM on VM.  She also needs follow up rescheduled with Dr Gwenette Lennox (missed appt 07/11/2023)  Cecilie Coffee, PharmD Clinical Pharmacist [facility]

## 2023-07-19 ENCOUNTER — Encounter: Payer: Self-pay | Admitting: Pharmacist

## 2023-07-19 ENCOUNTER — Telehealth: Payer: Self-pay

## 2023-07-19 NOTE — Telephone Encounter (Signed)
 Called patient back. I had reached out to check on her recent home blood glucose readings and make adjustments to Ozempic  dose if needed. Also reminded patient she missed appt with Dr Gwenette Lennox 07/11/2023 and to call office to rescheduled. Had to LM on VM. CB# either [phone] or [phone]

## 2023-07-19 NOTE — Telephone Encounter (Signed)
 Copied from CRM [id number]. Topic: General - Call Back - No Documentation >> Jul 19, 2023 11:09 AM Armenia J wrote: Reason for CRM: Patient is following up on a phone call she received from Tammy last week in regards to her ozempic . Please call patient back with details of call.

## 2023-07-23 ENCOUNTER — Other Ambulatory Visit: Payer: Self-pay | Admitting: Family Medicine

## 2023-07-23 DIAGNOSIS — F411 Generalized anxiety disorder: Secondary | ICD-10-CM

## 2023-08-01 ENCOUNTER — Other Ambulatory Visit (HOSPITAL_BASED_OUTPATIENT_CLINIC_OR_DEPARTMENT_OTHER): Payer: Self-pay

## 2023-08-01 ENCOUNTER — Ambulatory Visit (INDEPENDENT_AMBULATORY_CARE_PROVIDER_SITE_OTHER): Admitting: Family Medicine

## 2023-08-01 ENCOUNTER — Encounter: Payer: Self-pay | Admitting: Family Medicine

## 2023-08-01 ENCOUNTER — Telehealth: Payer: Self-pay | Admitting: Pharmacist

## 2023-08-01 VITALS — BP 126/74 | HR 87 | Temp 98.0°F | Resp 16 | Ht 64.0 in | Wt 168.0 lb

## 2023-08-01 DIAGNOSIS — F411 Generalized anxiety disorder: Secondary | ICD-10-CM

## 2023-08-01 DIAGNOSIS — E1140 Type 2 diabetes mellitus with diabetic neuropathy, unspecified: Secondary | ICD-10-CM | POA: Diagnosis not present

## 2023-08-01 DIAGNOSIS — Z7985 Long-term (current) use of injectable non-insulin antidiabetic drugs: Secondary | ICD-10-CM

## 2023-08-01 MED ORDER — ALPRAZOLAM 0.5 MG PO TABS: 0.5000 mg | ORAL_TABLET | Freq: Two times a day (BID) | ORAL | 5 refills | Status: DC | PRN

## 2023-08-01 MED ORDER — SEMAGLUTIDE (2 MG/DOSE) 8 MG/3ML ~~LOC~~ SOPN
2.0000 mg | PEN_INJECTOR | SUBCUTANEOUS | 5 refills | Status: AC
  Filled 2023-08-01 – 2023-12-05 (×2): qty 3, 28d supply, fill #0
  Filled 2024-01-02: qty 3, 28d supply, fill #1

## 2023-08-01 MED ORDER — SEMAGLUTIDE (1 MG/DOSE) 4 MG/3ML ~~LOC~~ SOPN
1.0000 mg | PEN_INJECTOR | SUBCUTANEOUS | 0 refills | Status: AC
  Filled 2023-08-01 – 2023-08-19 (×2): qty 3, 28d supply, fill #0

## 2023-08-01 MED ORDER — CYCLOBENZAPRINE HCL 10 MG PO TABS: 10.0000 mg | ORAL_TABLET | Freq: Three times a day (TID) | ORAL | 5 refills | Status: DC | PRN

## 2023-08-01 NOTE — Telephone Encounter (Signed)
 Received call from pharmacy that patient was there to pick up Ozempic  1mg  but stating that cost should be $0 and not $47.  Patient is enrolled in [employer] medication assistance program - we will need to submit updated Rx for new dose. Explained that it will take 2 to 4 weeks. She still has 2 pens of Ozempic  0.5mg . She can still use the Ozempic  she has - will inject 0.5mg  x 2 each week. 2 pens at this dose will last her 4 weeks.

## 2023-08-01 NOTE — Patient Instructions (Addendum)
 Keep the diet clean and stay active.  Give us  2-3 business days to get the results of your labs back.   Let me know if you are on a statin.   Let us  know if you need anything.

## 2023-08-01 NOTE — Progress Notes (Signed)
 Subjective:   Chief Complaint  Patient presents with   Medication Refill    Medication Refill    Kristina Patel is a 78 y.o. female here for follow-up of diabetes.   Kristina Patel's self monitored glucose range is 90-low 100's.  Patient denies hypoglycemic reactions. She checks her glucose levels 1 time(s) per mo. Patient does not require insulin.   Medications include: Ozempic  0.5 mg/week Diet is fair No CP or SOB.  Exercise: walking  GAD Pt is currently being treated with Xanax  0.5 mg bid prn.  Reports doing well since treatment. No thoughts of harming self or others. No self-medication with alcohol, prescription drugs or illicit drugs. Pt is following with a counselor/psychologist.  Past Medical History:  Diagnosis Date   Bacterial overgrowth syndrome    Breast mass    Carpal tunnel syndrome    Colon polyps    Degenerative disc disease    Depression    Fall    GERD (gastroesophageal reflux disease)    Gouty arthropathy    Headache    Hyperlipidemia    Hypertension    IBS (irritable bowel syndrome)    constipation   Internal prolapsed hemorrhoids    Iron deficiency anemia    Melanoma (HCC)    Osteoarthritis    Osteoporosis    Ovarian cancer (HCC)    Pelvic floor dysfunction 05/05/2016   Peripheral neuropathy    Pulmonary nodules    Renal disease    Rheumatoid arthritis(714.0)    Squamous cell carcinoma skin of abdomen    Uterine cancer (HCC)    Vitamin D deficiency      Related testing: [facility]: [facility] Pneumovax: done  Objective:  BP 126/74 (BP Location: Left Arm, Patient Position: Sitting)   Pulse 87   Temp 98 F (36.7 C) (Oral)   Resp 16   Ht 5\' 4"  (1.626 m)   Wt 168 lb (76.2 kg)   SpO2 95%   BMI 28.84 kg/m  General:  Well developed, well nourished, in no apparent distress Skin:  Warm, no pallor or diaphoresis Head:  Normocephalic, atraumatic Eyes:  Pupils equal and round, sclera anicteric without injection  Lungs:  CTAB, no  access msc use Cardio:  RRR, no bruits, no LE edema Musculoskeletal:  Symmetrical muscle groups noted without atrophy or deformity Neuro:  Sensation not intact to pinprick on feet Psych: Age appropriate judgment and insight  Assessment:   Type 2 diabetes mellitus with diabetic neuropathy, without long-term current use of insulin (HCC) - Plan: Comprehensive metabolic panel with GFR, Microalbumin / creatinine urine ratio, Lipid panel, Hemoglobin A1c  GAD (generalized anxiety disorder) - Plan: ALPRAZolam  (XANAX ) 0.5 MG tablet   Plan:   Chronic, hopefully stable. Increase Ozempic  to 1 mg/week and then 2 mg/week. Counseled on diet and exercise. Chronic, stable. Cont Xanax  0.5 mg TID prn. Cont w counseling.  F/u in 6 mo. The patient voiced understanding and agreement to the plan.  Shellie Dials Pomeroy, DO 08/01/23 3:30 PM

## 2023-08-02 ENCOUNTER — Ambulatory Visit: Payer: Self-pay | Admitting: Family Medicine

## 2023-08-02 ENCOUNTER — Other Ambulatory Visit (HOSPITAL_BASED_OUTPATIENT_CLINIC_OR_DEPARTMENT_OTHER): Payer: Self-pay

## 2023-08-02 ENCOUNTER — Other Ambulatory Visit (HOSPITAL_COMMUNITY): Payer: Self-pay

## 2023-08-02 ENCOUNTER — Telehealth: Payer: Self-pay

## 2023-08-02 LAB — COMPREHENSIVE METABOLIC PANEL WITH GFR
AG Ratio: 1.7 (calc) (ref 1.0–2.5)
ALT: 12 U/L (ref 6–29)
AST: 11 U/L (ref 10–35)
Albumin: 4.4 g/dL (ref 3.6–5.1)
Alkaline phosphatase (APISO): 72 U/L (ref 37–153)
BUN/Creatinine Ratio: 16 (calc) (ref 6–22)
BUN: 20 mg/dL (ref 7–25)
CO2: 22 mmol/L (ref 20–32)
Calcium: 9.3 mg/dL (ref 8.6–10.4)
Chloride: 106 mmol/L (ref 98–110)
Creat: 1.29 mg/dL — ABNORMAL HIGH (ref 0.60–1.00)
Globulin: 2.6 g/dL (ref 1.9–3.7)
Glucose, Bld: 108 mg/dL — ABNORMAL HIGH (ref 65–99)
Potassium: 4.8 mmol/L (ref 3.5–5.3)
Sodium: 136 mmol/L (ref 135–146)
Total Bilirubin: 0.3 mg/dL (ref 0.2–1.2)
Total Protein: 7 g/dL (ref 6.1–8.1)
eGFR: 43 mL/min/{1.73_m2} — ABNORMAL LOW (ref >=60)

## 2023-08-02 LAB — MICROALBUMIN / CREATININE URINE RATIO
Creatinine, Urine: 54 mg/dL (ref 20–275)
Microalb Creat Ratio: 6 mg/g{creat} (ref <30)
Microalb, Ur: 0.3 mg/dL

## 2023-08-02 LAB — LIPID PANEL
Cholesterol: 230 mg/dL — ABNORMAL HIGH (ref <200)
HDL: 57 mg/dL (ref >=50)
LDL Cholesterol (Calc): 130 mg/dL — ABNORMAL HIGH
Non-HDL Cholesterol (Calc): 173 mg/dL — ABNORMAL HIGH (ref <130)
Total CHOL/HDL Ratio: 4 (calc) (ref <5.0)
Triglycerides: 280 mg/dL — ABNORMAL HIGH (ref <150)

## 2023-08-02 LAB — HEMOGLOBIN A1C
Hgb A1c MFr Bld: 6.4 % — ABNORMAL HIGH (ref <5.7)
Mean Plasma Glucose: 137 mg/dL
eAG (mmol/L): 7.6 mmol/L

## 2023-08-02 NOTE — Telephone Encounter (Signed)
 Pharmacy Patient Advocate Encounter   Received notification from Patient Pharmacy that prior authorization for Ozempic  2 is required/requested.   Insurance verification completed.   The patient is insured through [facility] .   Per test claim: Refill too soon. PA is not needed at this time. Medication was filled 08/01/23. Next eligible fill date is 08/22/23.

## 2023-08-04 ENCOUNTER — Other Ambulatory Visit (HOSPITAL_BASED_OUTPATIENT_CLINIC_OR_DEPARTMENT_OTHER): Payer: Self-pay

## 2023-08-15 DIAGNOSIS — Z7901 Long term (current) use of anticoagulants: Secondary | ICD-10-CM | POA: Diagnosis not present

## 2023-08-15 DIAGNOSIS — M47816 Spondylosis without myelopathy or radiculopathy, lumbar region: Secondary | ICD-10-CM | POA: Diagnosis not present

## 2023-08-15 DIAGNOSIS — G894 Chronic pain syndrome: Secondary | ICD-10-CM | POA: Diagnosis not present

## 2023-08-16 NOTE — Progress Notes (Signed)
 [facility] Quality Team Note  Name: Kristina Patel Date of Birth: Jul 14, 1945 MRN: [id number] Date: 08/16/2023  [facility] Quality Team has reviewed this patient's chart, please see recommendations below:  [facility] Quality Other; (CHART REVIEWED FOR KIDNEY HEALTH EVALUATION IN DIABETICS. ABSTRACTED BOTH LABS FOR GAP CLOSURE.)

## 2023-08-19 ENCOUNTER — Other Ambulatory Visit (HOSPITAL_BASED_OUTPATIENT_CLINIC_OR_DEPARTMENT_OTHER): Payer: Self-pay

## 2023-08-29 ENCOUNTER — Telehealth: Payer: Self-pay

## 2023-08-29 NOTE — Telephone Encounter (Signed)
 Message sent

## 2023-08-30 ENCOUNTER — Other Ambulatory Visit: Payer: Self-pay | Admitting: Family Medicine

## 2023-08-30 ENCOUNTER — Telehealth: Payer: Self-pay

## 2023-08-30 DIAGNOSIS — U071 COVID-19: Secondary | ICD-10-CM

## 2023-08-30 NOTE — Telephone Encounter (Signed)
 Copied from CRM [id number]. Topic: Clinical - Prescription Issue >> Aug 30, 2023 12:23 PM Thersia BROCKS wrote: Reason for CRM: Arloa Prior called in regarding ALPRAZolam  (XANAX ) 0.5 MG tablet needs some clarification, stated that prescription is a form of Benzodiazepines and on patient list she is allergic , would like for someone to give them a callback regarding this  [phone]  Called Arloa Prior called in regarding ALPRAZolam  (XANAX ) 0.5 MG tablet advised them prescription is a form of Benzodiazepines. Provider has prescribed it since 2019 and pharmacist stated understand.

## 2023-09-16 ENCOUNTER — Other Ambulatory Visit (HOSPITAL_BASED_OUTPATIENT_CLINIC_OR_DEPARTMENT_OTHER): Payer: Self-pay

## 2023-09-19 DIAGNOSIS — Z5181 Encounter for therapeutic drug level monitoring: Secondary | ICD-10-CM | POA: Diagnosis not present

## 2023-09-19 DIAGNOSIS — Z79891 Long term (current) use of opiate analgesic: Secondary | ICD-10-CM | POA: Diagnosis not present

## 2023-09-19 DIAGNOSIS — M47816 Spondylosis without myelopathy or radiculopathy, lumbar region: Secondary | ICD-10-CM | POA: Diagnosis not present

## 2023-09-19 DIAGNOSIS — G894 Chronic pain syndrome: Secondary | ICD-10-CM | POA: Diagnosis not present

## 2023-09-26 ENCOUNTER — Telehealth: Payer: Self-pay

## 2023-09-26 NOTE — Telephone Encounter (Signed)
 Actually the Ozempic  received today was for 0.25/0.5mg  dose - patient is no longer on this dose. Notified [employer].  She is now taking Ozempic  1 mg weekly. We have a shipment of 1mg  dose waiting for her to pickup.   Patient has filled Ozempic  in 2025 but will not need to fill it again since she is on a new dose. Called Arloa Prior and called all remaining refills for 0.25/0.5mg  dose of Ozempic .   Spoke with patient and verified she is only taking Ozempic  1mg  weekly. Plan to check back with her in 3 to 4 weeks to review home blood glucose and weight.  Plan to increase to 2mg  weekly if tolerated and needed.   Madelin Ray, PharmD Clinical Pharmacist [facility]

## 2023-09-26 NOTE — Telephone Encounter (Signed)
 Called spoke with pt and advised her Ozempic   Ready for pickup, whenever. Pt stated understand.

## 2023-10-11 DIAGNOSIS — M25512 Pain in left shoulder: Secondary | ICD-10-CM | POA: Diagnosis not present

## 2023-10-11 DIAGNOSIS — M7918 Myalgia, other site: Secondary | ICD-10-CM | POA: Diagnosis not present

## 2023-10-27 ENCOUNTER — Ambulatory Visit (INDEPENDENT_AMBULATORY_CARE_PROVIDER_SITE_OTHER): Admitting: Podiatry

## 2023-10-27 DIAGNOSIS — L84 Corns and callosities: Secondary | ICD-10-CM

## 2023-10-27 DIAGNOSIS — M2042 Other hammer toe(s) (acquired), left foot: Secondary | ICD-10-CM

## 2023-10-27 DIAGNOSIS — E1149 Type 2 diabetes mellitus with other diabetic neurological complication: Secondary | ICD-10-CM

## 2023-10-27 DIAGNOSIS — M2041 Other hammer toe(s) (acquired), right foot: Secondary | ICD-10-CM

## 2023-10-27 NOTE — Progress Notes (Signed)
 Chief Complaint  Patient presents with   Toe Pain    Patient is here for hammertoes bleeding on right foot and soreness from such.    HPI: 78 y.o. female presents today with concern for painful preulcerative calluses present distal right 2nd and 3rd toes as well as right subsecond metatarsal head, right subfifth metatarsal head.  Endorses picking at the calluses on the ends of the toes and they have had some bleeding.  No active bleeding at this point.  No signs of infection.  She is diabetic with neuropathy.  She had been seeing Dr. Joya and Dr. Alona regularly in the past.  She is unsure of her last A1c.  Past Medical History:  Diagnosis Date   Bacterial overgrowth syndrome    Breast mass    Carpal tunnel syndrome    Colon polyps    Degenerative disc disease    Depression    Fall    GERD (gastroesophageal reflux disease)    Gouty arthropathy    Headache    Hyperlipidemia    Hypertension    IBS (irritable bowel syndrome)    constipation   Internal prolapsed hemorrhoids    Iron deficiency anemia    Melanoma (HCC)    Osteoarthritis    Osteoporosis    Ovarian cancer (HCC)    Pelvic floor dysfunction 05/05/2016   Peripheral neuropathy    Pulmonary nodules    Renal disease    Rheumatoid arthritis(714.0)    Squamous cell carcinoma skin of abdomen    Uterine cancer (HCC)    Vitamin D deficiency     Past Surgical History:  Procedure Laterality Date   ABDOMINAL HYSTERECTOMY     partial   ADENOIDECTOMY     ANAL RECTAL MANOMETRY N/A 04/30/2016   Procedure: ANO RECTAL MANOMETRY;  Surgeon: Gustav Shila GAILS, MD;  Location: [facility];  Service: Endoscopy;  Laterality: N/A;   APPENDECTOMY     BREAST SURGERY Bilateral    benign fatty tumor removed   CARPAL TUNNEL RELEASE Right    CERVICAL SPINE SURGERY     CHOLECYSTECTOMY     COLONOSCOPY     KNEE ARTHROPLASTY Right    LUMBAR DISC SURGERY     MASTECTOMY, PARTIAL     MELANOMA EXCISION     OPERATIVE HYSTEROSCOPY      TONSILLECTOMY      Allergies  Allergen Reactions   Butalbital-Asa-Caff-Codeine [Fiorinal-Codeine] Anaphylaxis and Swelling   Darvon Other (See Comments)    Irritable & hyper   Fiorinal [Butalbital-Aspirin-Caffeine] Anaphylaxis   Penciclovir Anaphylaxis   Penicillins Anaphylaxis    Has patient had a PCN reaction causing immediate rash, facial/tongue/throat swelling, SOB or lightheadedness with hypotension: Yes Has patient had a PCN reaction causing severe rash involving mucus membranes or skin necrosis: No Has patient had a PCN reaction that required hospitalization: No Has patient had a PCN reaction occurring within the last 10 years: No If all of the above answers are NO, then may proceed with Cephalosporin use.   Buprenorphine Rash   Amitriptyline  Other (See Comments)    hyperactivity   Ativan [Lorazepam] Other (See Comments)    Adverse reaction...made patient act out - out of her mind   Bactrim  [Sulfamethoxazole -Trimethoprim ] Diarrhea, Nausea And Vomiting and Other (See Comments)    Severe cramps   Ciprofloxacin Hcl Nausea And Vomiting and Other (See Comments)    hallucinations   Gabapentin     Lisinopril Cough   Tape Other (See Comments)  Allergic to plastic tape. The adhesive causes redness to skin as well as loss of skin. - please use paper tape   Ambien [Zolpidem Tartrate] Other (See Comments)    Sleep walking   Benzodiazepines Cough   Metoclopramide  Anxiety   Propoxyphene Rash and Other (See Comments)    Irritable and hyperactive   Zolpidem Tartrate     Other reaction(s): Other (See Comments) Sleep walking Other reaction(s): Other (See Comments) Sleep walking    ROS denies any nausea, vomiting, fever, chills, chest pain, shortness of breath   Physical Exam: There were no vitals filed for this visit.  General: The patient is alert and oriented x3 in no acute distress.  Dermatology: Preulcerative callus present distal right 2nd and 3rd toes, right  subsecond metatarsal head, right subfifth metatarsal head.  Dried blood present on the affected toes.  No wounds or ulcerations noted.  Pedal skin atrophic.  Vascular: Palpable pedal pulses bilaterally. Capillary refill within normal limits.  Decreased pedal hair growth.  Neurological: Light touch sensation grossly intact bilateral feet.  Protective sensation decreased.  Musculoskeletal Exam: Right foot greater than left there are semireducible hammertoe deformities.  Bunion deformity with prominent fifth metatarsal head.  Muscle strength 5/5 for all major muscle groups.  Assessment/Plan of Care: 1. Pre-ulcerative calluses   2. Type II diabetes mellitus with neurological manifestations (HCC)   3. Acquired hammertoes of both feet      No orders of the defined types were placed in this encounter.  None  Discussed clinical findings with patient today.  # Preulcerative callus present distal right 2nd and 3rd toe, right subsecond metatarsal head, right subfifth metatarsal head All symptomatic hyperkeratoses x 4 were safely debrided with a sterile #312 blade to patient's level of comfort without incident. We discussed preventative and palliative care of these lesions including supportive and accommodative shoegear, padding, prefabricated and custom molded accommodative orthoses, use of a pumice stone and lotions/creams daily.   # Hammertoes of both feet # Prominent metatarsal heads right foot -Discussed accommodative padding and offloading of these areas.  Crest pads and metatarsal pads applied and dispensed - Discussed shoe modification and shoe choice as well.  # Diabetes neuropathy Patient educated on diabetes. Discussed proper diabetic foot care and discussed risks and complications of disease. Educated patient in depth on reasons to return to the office immediately should he/she discover anything concerning or new on the feet. All questions answered. Discussed proper shoes as well.    Follow-up in 3 months with either Dr. Sikora or Dr. Gershon for diabetic foot care.   Satia Winger L. Lamount MAUL, AACFAS [facility]     [address] [place], [place] [address]                Office [phone]  Fax [phone]

## 2023-10-31 ENCOUNTER — Encounter: Payer: Self-pay | Admitting: Podiatry

## 2023-11-09 ENCOUNTER — Telehealth: Payer: Self-pay | Admitting: Pharmacist

## 2023-11-09 NOTE — Telephone Encounter (Signed)
 Received fax from [employer] that patient is due refill for Ozempic . Dr Frann has planned to increase dose with next refill from 1mg  weekly to Ozempic  2mg  weekly. Discussed plan with patient. She reports she has not seen much change in her weight with the last dose change to 1mg  weekly. She as 4 doses of the Ozempic  1mg  on hand.   Completed dose change form and forwarded to PCP.

## 2023-11-29 ENCOUNTER — Telehealth: Payer: Self-pay | Admitting: Pharmacist

## 2023-11-29 NOTE — Telephone Encounter (Signed)
 Received a fax from [employer] that the date was missing from patient's reorder form for Ozempic . Reviewed documents faxed and the date is there but may not have been legible. Called [employer]. They are asking for clarification on physician's signature date. Updated date to be clearer and refaxed to [employer].

## 2023-12-02 ENCOUNTER — Encounter: Payer: Self-pay | Admitting: Pharmacist

## 2023-12-05 ENCOUNTER — Other Ambulatory Visit (HOSPITAL_BASED_OUTPATIENT_CLINIC_OR_DEPARTMENT_OTHER): Payer: Self-pay

## 2023-12-05 ENCOUNTER — Other Ambulatory Visit: Payer: Self-pay

## 2023-12-07 ENCOUNTER — Ambulatory Visit

## 2023-12-07 ENCOUNTER — Encounter (INDEPENDENT_AMBULATORY_CARE_PROVIDER_SITE_OTHER): Payer: Self-pay

## 2023-12-07 ENCOUNTER — Telehealth: Payer: Self-pay | Admitting: *Deleted

## 2023-12-07 NOTE — Telephone Encounter (Signed)
 Pt was scheduled for AWV today at 10:20.  No answer x 2. Left message to call and R/S. May place on wellness visit 1 schedule at [facility].

## 2023-12-12 ENCOUNTER — Encounter (INDEPENDENT_AMBULATORY_CARE_PROVIDER_SITE_OTHER): Payer: Self-pay

## 2023-12-12 ENCOUNTER — Ambulatory Visit (INDEPENDENT_AMBULATORY_CARE_PROVIDER_SITE_OTHER)

## 2023-12-12 VITALS — BP 119/76 | HR 82 | Ht 64.0 in | Wt 179.0 lb

## 2023-12-12 DIAGNOSIS — B3789 Other sites of candidiasis: Secondary | ICD-10-CM

## 2023-12-12 DIAGNOSIS — R070 Pain in throat: Secondary | ICD-10-CM

## 2023-12-12 MED ORDER — FLUCONAZOLE 150 MG PO TABS: 150.0000 mg | ORAL_TABLET | Freq: Every day | ORAL | 0 refills | Status: AC

## 2023-12-12 NOTE — Progress Notes (Signed)
 Dear Dr. Georgina, Here is my assessment for our mutual patient, Kristina Patel. Thank you for allowing me the opportunity to care for your patient. Please do not hesitate to contact me should you have any other questions. Sincerely, Dr. Hadassah Parody  Otolaryngology Clinic Note Referring provider: Dr. Georgina HPI:   Initial HPI (12/12/2023) Discussed the use of AI scribe software for clinical note transcription with the patient, who gave verbal consent to proceed.  History of Present Illness Kristina Patel is a 78 year old female who presents with chronic throat pain.   Reports persistent throat pain since January 30, 2021, following lower teeth extraction.  She previously had this pain in 2024 when she saw laryngologist Cathlean Cohens in 2024. At that visit pt was noted to have laryngeal candidiasis. Given diflucan. Pt reports this significantly improved her sx but she did not follow-up afterward and her symptoms returned.   Additionally pt has multiple oral sores that her oral surgeon is helping to manage as well. Most of these are along the lines of her dentures. They will come and go. Uses lidocaine  solution to help and recently started steroid mouthwash.     Independent Review of Additional Tests or Records:  Note by Alm Georgina, MD (10/11/22): treated for cerumen impaction   Note by Mirna Cohens, DO (07/15/22): had dysphonia at the time, scope showed candidiasis. Given diflucan.    PMH/Meds/All/SocHx/FamHx/ROS:   Past Medical History:  Diagnosis Date   Bacterial overgrowth syndrome    Breast mass    Carpal tunnel syndrome    Colon polyps    Degenerative disc disease    Depression    Fall    GERD (gastroesophageal reflux disease)    Gouty arthropathy    Headache    Hyperlipidemia    Hypertension    IBS (irritable bowel syndrome)    constipation   Internal prolapsed hemorrhoids    Iron deficiency anemia    Melanoma (HCC)    Osteoarthritis    Osteoporosis     Ovarian cancer (HCC)    Pelvic floor dysfunction 05/05/2016   Peripheral neuropathy    Pulmonary nodules    Renal disease    Rheumatoid arthritis(714.0)    Squamous cell carcinoma skin of abdomen    Uterine cancer (HCC)    Vitamin D deficiency      Past Surgical History:  Procedure Laterality Date   ABDOMINAL HYSTERECTOMY     partial   ADENOIDECTOMY     ANAL RECTAL MANOMETRY N/A 04/30/2016   Procedure: ANO RECTAL MANOMETRY;  Surgeon: Gustav Shila GAILS, MD;  Location: [facility];  Service: Endoscopy;  Laterality: N/A;   APPENDECTOMY     BREAST SURGERY Bilateral    benign fatty tumor removed   CARPAL TUNNEL RELEASE Right    CERVICAL SPINE SURGERY     CHOLECYSTECTOMY     COLONOSCOPY     KNEE ARTHROPLASTY Right    LUMBAR DISC SURGERY     MASTECTOMY, PARTIAL     MELANOMA EXCISION     OPERATIVE HYSTEROSCOPY     TONSILLECTOMY      Family History  Problem Relation Age of Onset   Alzheimer's disease Mother    Colon cancer Mother 4   Lung cancer Mother    Dementia Mother    Melanoma Father    Non-Hodgkin's lymphoma Father    Heart disease Maternal Grandmother    Colon cancer Maternal Grandfather    Colon cancer Paternal Grandmother    Colon cancer  Paternal Grandfather      Social Connections: Socially Isolated (12/01/2023)   Social Connection and Isolation Panel    Frequency of Communication with Friends and Family: Never    Frequency of Social Gatherings with Friends and Family: Never    Attends Religious Services: Never    Database administrator or Organizations: No    Attends Engineer, structural: Not on file    Marital Status: Married     Current Outpatient Medications  Medication Instructions   acetaminophen  (TYLENOL ) 325 mg, Every 6 hours PRN   ACIDOPHILUS LACTOBACILLUS PO 2 tablets, 3 times daily with meals   ALPRAZolam  (XANAX ) 0.5 mg, Oral, Every 12 hours PRN   aspirin-acetaminophen -caffeine (EXCEDRIN MIGRAINE) 250-250-65 MG tablet 1 tablet, Daily  PRN   calcium-vitamin D (OSCAL WITH D) 500-200 MG-UNIT TABS tablet 1 tablet, Every evening   Cholecalciferol (D2000 ULTRA STRENGTH) 50 MCG (2000 UT) CAPS Take by mouth.   cyanocobalamin 1,000 mcg, Daily   cyclobenzaprine  (FLEXERIL ) 10 mg, Oral, 3 times daily PRN   diphenhydrAMINE  (BENADRYL ) 25 mg capsule Take by mouth.   esomeprazole (NEXIUM) 40 MG capsule Take by mouth.   famotidine  (PEPCID ) 20 mg, Oral, Daily at bedtime   fenofibrate  (TRICOR ) 48 mg, Oral, Daily   fluconazole (DIFLUCAN) 150 mg, Oral, Daily   leflunomide (ARAVA) 10 MG tablet 1 tablet, Daily   lidocaine  (XYLOCAINE ) 2 % jelly 1 application , As needed   Magnesium Oxide 420 MG TABS 1 tablet, Daily   Multiple Vitamin (MULTIVITAMIN WITH MINERALS) TABS tablet 1 tablet, Daily with supper   nystatin (MYCOSTATIN/NYSTOP) powder Apply topically.   ondansetron  (ZOFRAN -ODT) 4 MG disintegrating tablet DISSOLVE ONE TABLET ON TONGUE EVERY 8 HOURS AS NEEDED FOR NAUSEA AND/OR VOMITING   Ozempic  (2 MG/DOSE) 2 mg, Subcutaneous, Weekly   pantoprazole  (PROTONIX ) 40 mg, Daily   topiramate  (TOPAMAX ) 50 mg, Oral, 2 times daily   TURMERIC PO 900 mg, 3 times daily with meals     Physical Exam:   BP 119/76 (BP Location: Left Arm, Patient Position: Sitting)   Pulse 82   Ht 5' 4 (1.626 m)   Wt 179 lb (81.2 kg)   SpO2 96%   BMI 30.73 kg/m   Salient findings:  CN II-XII intact Bilateral EAC clear and TM intact with well pneumatized middle ear spaces Anterior rhinoscopy: Septum midline TFL was indicated to better evaluate the proximal airway, given the patient's history and exam findings, and is detailed below. No lesions of oral cavity/oropharynx; dentition - edentulous. Uses dentures secured with posts. Multiple small ulcers around her dentures that she says will come and go  No obviously palpable neck masses/lymphadenopathy/thyromegaly No respiratory distress or stridor  Seprately Identifiable Procedures:  Prior to initiating any  procedures, risks/benefits/alternatives were explained to the patient and verbal consent obtained.  Procedure Note (12/12/2023) Pre-procedure diagnosis: throat pain, history of laryngeal candidiasis  Post-procedure diagnosis: Same Procedure: Transnasal Fiberoptic Laryngoscopy, CPT 31575 - Mod 25 Indication: throat pain, history of laryngeal candidiasis  Complications: None apparent EBL: 0 mL  The procedure was undertaken to further evaluate the patient's complaint of throat pain, history of laryngeal candidiasis , with mirror exam inadequate for appropriate examination due to gag reflex and poor patient tolerance  Procedure:  Patient was identified as correct patient. Verbal consent was obtained. The nose was sprayed with oxymetazoline  and 4% lidocaine . The The flexible laryngoscope was passed through the nose to view the nasal cavity, pharynx (oropharynx, hypopharynx) and larynx.  The larynx was  examined at rest and during multiple phonatory tasks. Documentation was obtained and reviewed with patient. The scope was removed. The patient tolerated the procedure well.  Findings: The nasal cavity and nasopharynx did not reveal any masses or lesions, mucosa appeared to be without obvious lesions. The tongue base, pharyngeal walls, piriform sinuses, vallecula, epiglottis and postcricoid region are normal in appearance EXCEPT: mild erythema round larynx, some patchy white exudate characteristic of candidiasis. The visualized portion of the subglottis and proximal trachea is widely patent. The vocal folds are mobile bilaterally. There are no lesions on the free edge of the vocal folds nor elsewhere in the larynx worrisome for malignancy.    Electronically signed by: Hadassah JAYSON Parody, MD 12/12/2023 6:38 PM   Impression & Plans:  Kristina Patel is a 78 y.o. female with   1. Laryngeal candidiasis   2. 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 Pain inhibits solid food intake. No concern for malignancy given appearance and tendency to come and go, location around dentures  - managed primarily by oral surgeon. Recommend continuing management per their recommendations   Laryngeal candidiasis Throat pain  Similar throat pain to what she states he had in 2024 which was relieved by diflucan. Mild redness and fewer white patches indicate persistent low-grade infection.  - Prescribe Diflucan for two weeks. - Evaluate response to treatment in one month.    See below regarding exact medications prescribed this encounter including dosages and route: Meds ordered this encounter  Medications   fluconazole (DIFLUCAN) 150 MG tablet    Sig: Take 1 tablet (150 mg total) by mouth daily for 14 doses.    Dispense:  14 tablet    Refill:  0      Thank you for allowing me the opportunity to care for your patient. Please do not hesitate to contact me should you have any other questions.  Sincerely, Hadassah Parody, MD Otolaryngologist (ENT), [facility] Phone: [phone] Fax: [phone]

## 2023-12-13 ENCOUNTER — Telehealth: Payer: Self-pay

## 2023-12-13 DIAGNOSIS — M47816 Spondylosis without myelopathy or radiculopathy, lumbar region: Secondary | ICD-10-CM | POA: Diagnosis not present

## 2023-12-13 DIAGNOSIS — G894 Chronic pain syndrome: Secondary | ICD-10-CM | POA: Diagnosis not present

## 2023-12-13 DIAGNOSIS — Z5181 Encounter for therapeutic drug level monitoring: Secondary | ICD-10-CM | POA: Diagnosis not present

## 2023-12-13 DIAGNOSIS — Z79891 Long term (current) use of opiate analgesic: Secondary | ICD-10-CM | POA: Diagnosis not present

## 2023-12-13 NOTE — Telephone Encounter (Signed)
 Called pt was advised her Ozempic  was ready for pickup, Pt said they would pick it up tomorrow.

## 2023-12-21 ENCOUNTER — Other Ambulatory Visit: Payer: Self-pay | Admitting: Family Medicine

## 2023-12-22 ENCOUNTER — Telehealth: Payer: Self-pay | Admitting: Pharmacist

## 2023-12-22 ENCOUNTER — Other Ambulatory Visit: Payer: Self-pay | Admitting: Pharmacist

## 2023-12-22 NOTE — Progress Notes (Signed)
 Attempt was made to contact patient by phone today for follow up by Clinical Pharmacist regarding medication access / Ozempic .  Unable to reach patient. LM on VM with my contact number [phone].     Addendum - patient called  back - she was at another appt. Rescheduled appt for 12/23/2023 at 11am.

## 2023-12-23 ENCOUNTER — Other Ambulatory Visit: Payer: Self-pay | Admitting: Pharmacist

## 2023-12-23 DIAGNOSIS — Z59868 Other specified financial insecurity: Secondary | ICD-10-CM

## 2023-12-23 DIAGNOSIS — E1140 Type 2 diabetes mellitus with diabetic neuropathy, unspecified: Secondary | ICD-10-CM

## 2023-12-23 NOTE — Progress Notes (Signed)
 12/23/2023 Name: DENESIA DONELAN MRN: [id number] DOB: 1945/08/21  Chief Complaint  Patient presents with   Medication Management   Diabetes    MAYSA LYNN is a 78 y.o. year old female who presented for a telephone visit.   They were referred to the pharmacist by their PCP for assistance in managing medication access.    Subjective:   Medication Access/Adherence  Current Pharmacy:  [facility] [id number] - [place], [place] - [address] [place] [address] Phone: [phone] Fax: [phone]   Patient reports affordability concerns with their medications: Yes  - has been receiving Ozempic  thru [employer] patient assistance program in 2025.  Patient reports access/transportation concerns to their pharmacy: No  Patient reports adherence concerns with their medications:  No      Diabetes:  Current medications: ozempic  2mg  weekly   Medications tried in the past: metformin  - tried prior to Ozmepic but metformin  was stopped due to blood glucose dropped too low.    Patient denies hypoglycemic s/sx including no dizziness, shakiness, sweating. Patient denies hyperglycemic symptoms including no polyuria, polydipsia, polyphagia, nocturia, neuropathy, blurred vision.   Macrovascular and Microvascular Risk Reduction:  Statin? no; patient declined statin therapy; ACEi/ARB? no; therapy not indicated  Last urinary albumin/creatinine ratio:  Lab Results  Component Value Date   MICRALBCREAT 6 08/01/2023   Last eye exam:   Last foot exam: No foot exam found Tobacco Use:  Tobacco Use: Low Risk  (12/22/2023)   Received from [facility]   Patient History    Smoking Tobacco Use: Never    Smokeless Tobacco Use: Never    Passive Exposure: Not on file     Objective:  BP Readings from Last 3 Encounters:  12/12/23 119/76  08/01/23 126/74  01/10/23 126/78     Lab Results  Component Value Date   HGBA1C 6.4 (H)  08/01/2023    Lab Results  Component Value Date   CREATININE 1.29 (H) 08/01/2023   BUN 20 08/01/2023   NA 136 08/01/2023   K 4.8 08/01/2023   CL 106 08/01/2023   CO2 22 08/01/2023    Lab Results  Component Value Date   CHOL 230 (H) 08/01/2023   HDL 57 08/01/2023   LDLCALC 130 (H) 08/01/2023   LDLDIRECT 119.0 12/09/2021   TRIG 280 (H) 08/01/2023   CHOLHDL 4.0 08/01/2023    Medications Reviewed Today     Reviewed by Carla Milling, RPH-CPP (Pharmacist) on 12/23/23 at 1136  Med List Status: <None>   Medication Order Taking? Sig Documenting Provider Last Dose Status Informant  acetaminophen  (TYLENOL ) 325 MG tablet [id number]  Take 325 mg by mouth every 6 (six) hours as needed for headache (pain). [provider]  Active Self  ACIDOPHILUS LACTOBACILLUS PO [id number]  Take 2 tablets by mouth 3 (three) times daily with meals.  [provider]  Active Self  ALPRAZolam  (XANAX ) 0.5 MG tablet [id number]  Take 1 tablet (0.5 mg total) by mouth every 12 (twelve) hours as needed. Frann Mabel Mt, DO  Active   aspirin-acetaminophen -caffeine (EXCEDRIN MIGRAINE) 250-250-65 MG tablet [id number]  Take 1 tablet by mouth daily as needed for headache or migraine. [provider]  Active Self  calcium-vitamin D (OSCAL WITH D) 500-200 MG-UNIT TABS tablet [id number]  Take 1 tablet by mouth every evening. [provider]  Active   Cholecalciferol (D2000 ULTRA STRENGTH) 50 MCG (2000 UT) CAPS [id number]  Take by mouth. [provider]  Active   cyanocobalamin 1000 MCG tablet [id number]  Take 1,000 mcg by mouth daily. [provider]  Active   cyclobenzaprine  (FLEXERIL ) 10 MG tablet [id number]  Take 1 tablet (10 mg total) by mouth 3 (three) times daily as needed for muscle spasms. Frann Mabel Mt, DO  Active   diphenhydrAMINE  (BENADRYL ) 25 mg capsule [id number]  Take by mouth. [provider]  Active   esomeprazole (NEXIUM) 40 MG capsule  [id number]  Take by mouth. [provider]  Active   famotidine  (PEPCID ) 20 MG tablet [id number]  TAKE 1 TABLET BY MOUTH AT BEDTIME Frann Mabel Mt, DO  Active   fenofibrate  (TRICOR ) 48 MG tablet [id number]  TAKE ONE TABLET BY MOUTH DAILY Frann Mabel Mt, DO  Active   fluconazole (DIFLUCAN) 150 MG tablet [id number]  Take 1 tablet (150 mg total) by mouth daily for 14 doses. Masciello, Maria C, MD  Active   leflunomide (ARAVA) 10 MG tablet [id number]  Take 1 tablet by mouth daily. [provider]  Active   lidocaine  (XYLOCAINE ) 2 % jelly [id number]  1 application as needed. [provider]  Active   Magnesium Oxide 420 MG TABS [id number]  Take 1 tablet by mouth daily. [provider]  Active   Multiple Vitamin (MULTIVITAMIN WITH MINERALS) TABS tablet [id number]  Take 1 tablet by mouth daily with supper. [provider]  Active Self  nystatin (MYCOSTATIN/NYSTOP) powder [id number]  Apply topically. [provider]  Active   ondansetron  (ZOFRAN -ODT) 4 MG disintegrating tablet [id number]  DISSOLVE ONE TABLET ON TONGUE EVERY 8 HOURS AS NEEDED FOR NAUSEA AND/OR VOMITING Frann Mabel Mt, DO  Active   pantoprazole  (PROTONIX ) 40 MG tablet [id number]  Take 40 mg by mouth daily. [provider]  Active   Semaglutide , 2 MG/DOSE, 8 MG/3ML SOPN [id number]  Inject 2 mg into the skin once a week. Frann Mabel Mt, DO  Active   topiramate  (TOPAMAX ) 25 MG tablet [id number]  Take 2 tablets (50 mg total) by mouth 2 (two) times daily. Rosemarie Eather RAMAN, MD  Active Self  TURMERIC PO [id number]  Take 900 mg by mouth 3 (three) times daily with meals. [provider]  Active Self  Med List Note Mylo Powell CROME, CPhT 01/13/18 2035): [facility]              Assessment/Plan:   Medication Access:  - Discussed that Ozempic  would not be available for Medicare patients in 2026 if their Medicare plan covered Ozempic . Reviewed her  current [employer] Part D plan [phone] which she plans to continue in 2026. Her deductible would be $615 and co insurance for Ozempic  tier 3 or 25% of cost = $250 / month.  - Discussed option of using Pharmacy Payment plan - her monthly cost would be about $175. Patient is considering.  - Patient also has coverage thru [place] but she states that in past they would not allow her to get Ozempic . I recommended that she discuss with when she sees her new provider at the [place] in December. I think if she can get thru the [place] that would be lowest cost to patient.  Follow Up Plan: 3 months  Madelin Ray, PharmD Clinical Pharmacist [facility]

## 2023-12-28 ENCOUNTER — Telehealth: Payer: Self-pay | Admitting: Family Medicine

## 2023-12-28 NOTE — Telephone Encounter (Signed)
 Copied from CRM [id number]. Topic: Medicare AWV >> Dec 28, 2023 11:13 AM Nathanel DEL wrote: Reason for CRM: Called LVM 12/27/2023 to schedule AWV. Please schedule in office only DUE TO [employer]  Nathanel Paschal; Care Guide Ambulatory Clinical Support [place] [facility] Direct Dial: [phone]

## 2024-01-02 ENCOUNTER — Other Ambulatory Visit (HOSPITAL_BASED_OUTPATIENT_CLINIC_OR_DEPARTMENT_OTHER): Payer: Self-pay

## 2024-01-04 DIAGNOSIS — G894 Chronic pain syndrome: Secondary | ICD-10-CM | POA: Diagnosis not present

## 2024-01-04 DIAGNOSIS — M47816 Spondylosis without myelopathy or radiculopathy, lumbar region: Secondary | ICD-10-CM | POA: Diagnosis not present

## 2024-01-04 DIAGNOSIS — M5416 Radiculopathy, lumbar region: Secondary | ICD-10-CM | POA: Diagnosis not present

## 2024-01-04 DIAGNOSIS — Z79891 Long term (current) use of opiate analgesic: Secondary | ICD-10-CM | POA: Diagnosis not present

## 2024-01-04 DIAGNOSIS — Z5181 Encounter for therapeutic drug level monitoring: Secondary | ICD-10-CM | POA: Diagnosis not present

## 2024-01-09 ENCOUNTER — Ambulatory Visit (INDEPENDENT_AMBULATORY_CARE_PROVIDER_SITE_OTHER)

## 2024-01-09 DIAGNOSIS — L820 Inflamed seborrheic keratosis: Secondary | ICD-10-CM | POA: Diagnosis not present

## 2024-01-09 DIAGNOSIS — L308 Other specified dermatitis: Secondary | ICD-10-CM | POA: Diagnosis not present

## 2024-01-22 ENCOUNTER — Encounter: Payer: Self-pay | Admitting: Family Medicine

## 2024-01-23 ENCOUNTER — Other Ambulatory Visit: Payer: Self-pay | Admitting: Family Medicine

## 2024-01-23 DIAGNOSIS — F411 Generalized anxiety disorder: Secondary | ICD-10-CM

## 2024-01-23 MED ORDER — ALPRAZOLAM 0.5 MG PO TABS: 0.5000 mg | ORAL_TABLET | Freq: Two times a day (BID) | ORAL | 5 refills | Status: AC | PRN

## 2024-01-26 ENCOUNTER — Ambulatory Visit

## 2024-01-26 ENCOUNTER — Encounter: Payer: Self-pay | Admitting: Podiatry

## 2024-01-26 ENCOUNTER — Ambulatory Visit: Admitting: Podiatry

## 2024-01-26 VITALS — Ht 64.0 in | Wt 179.0 lb

## 2024-01-26 DIAGNOSIS — E1149 Type 2 diabetes mellitus with other diabetic neurological complication: Secondary | ICD-10-CM | POA: Diagnosis not present

## 2024-01-26 DIAGNOSIS — M2041 Other hammer toe(s) (acquired), right foot: Secondary | ICD-10-CM

## 2024-01-26 DIAGNOSIS — M7751 Other enthesopathy of right foot: Secondary | ICD-10-CM | POA: Diagnosis not present

## 2024-01-26 DIAGNOSIS — L84 Corns and callosities: Secondary | ICD-10-CM | POA: Diagnosis not present

## 2024-01-26 DIAGNOSIS — B351 Tinea unguium: Secondary | ICD-10-CM | POA: Diagnosis not present

## 2024-01-26 DIAGNOSIS — M79674 Pain in right toe(s): Secondary | ICD-10-CM | POA: Diagnosis not present

## 2024-01-26 DIAGNOSIS — M79675 Pain in left toe(s): Secondary | ICD-10-CM | POA: Diagnosis not present

## 2024-01-27 ENCOUNTER — Telehealth (INDEPENDENT_AMBULATORY_CARE_PROVIDER_SITE_OTHER): Payer: Self-pay

## 2024-01-27 ENCOUNTER — Encounter (INDEPENDENT_AMBULATORY_CARE_PROVIDER_SITE_OTHER): Payer: Self-pay

## 2024-01-27 ENCOUNTER — Ambulatory Visit (INDEPENDENT_AMBULATORY_CARE_PROVIDER_SITE_OTHER)

## 2024-01-27 VITALS — BP 132/89 | HR 68

## 2024-01-27 DIAGNOSIS — R070 Pain in throat: Secondary | ICD-10-CM

## 2024-01-27 DIAGNOSIS — K1379 Other lesions of oral mucosa: Secondary | ICD-10-CM

## 2024-01-27 DIAGNOSIS — B3789 Other sites of candidiasis: Secondary | ICD-10-CM

## 2024-01-27 NOTE — Telephone Encounter (Signed)
 Patient left voice message wanting to know the name of the dentist that Dr. Greggory recommended.  I called the patient back and left a voice message with the information.  (Dr. Deatrice Lash [facility]).

## 2024-01-29 NOTE — Progress Notes (Signed)
 Dear Dr. Frann, Here is my assessment for our mutual patient, Kristina Patel. Thank you for allowing me the opportunity to care for your patient. Please do not hesitate to contact me should you have any other questions. Sincerely, Dr. Hadassah Parody  Otolaryngology Clinic Note Referring provider: Dr. Frann HPI:   Initial HPI (12/12/2023) Discussed the use of AI scribe software for clinical note transcription with the patient, who gave verbal consent to proceed.  PUANANI GENE is a 78 year old female who presents with chronic throat pain.   Reports persistent throat pain since January 30, 2021, following lower teeth extraction.  She previously had this pain in 2024 when she saw laryngologist Cathlean Cohens in 2024. At that visit pt was noted to have laryngeal candidiasis. Given diflucan . Pt reports this significantly improved her sx but she did not follow-up afterward and her symptoms returned.   Additionally pt has multiple oral sores that her oral surgeon is helping to manage as well. Most of these are along the lines of her dentures. They will come and go. Uses lidocaine  solution to help and recently started steroid mouthwash.   History of Present Illness --------------------------------------------------------- 01/27/2024   Here for f/u. Did well with diflucan  and symptoms improved but once she stops the medicine her symptoms return.    Oropharyngeal discomfort - Persistent discomfort in mouth and throat, - Significant impact on ability to eat - the mouth discomfort is the most bothersome since inhibits her ability to eat.   Magic mouthwash has not helped in past   Oral prosthesis-related issues - Dentures have not fit well for four years - Dentures may exacerbate oral discomfort - Occasional oral sores, particularly on the back right side, attributed to denture irritation    Independent Review of Additional Tests or Records:  Note by Alm Ada, MD (10/11/22):  treated for cerumen impaction   Note by Mirna Cohens, DO (07/15/22): had dysphonia at the time, scope showed candidiasis. Given diflucan .    PMH/Meds/All/SocHx/FamHx/ROS:   Past Medical History:  Diagnosis Date   Bacterial overgrowth syndrome    Breast mass    Carpal tunnel syndrome    Colon polyps    Degenerative disc disease    Depression    Fall    GERD (gastroesophageal reflux disease)    Gouty arthropathy    Headache    Hyperlipidemia    Hypertension    IBS (irritable bowel syndrome)    constipation   Internal prolapsed hemorrhoids    Iron deficiency anemia    Melanoma (HCC)    Osteoarthritis    Osteoporosis    Ovarian cancer (HCC)    Pelvic floor dysfunction 05/05/2016   Peripheral neuropathy    Pulmonary nodules    Renal disease    Rheumatoid arthritis(714.0)    Squamous cell carcinoma skin of abdomen    Uterine cancer (HCC)    Vitamin D deficiency      Past Surgical History:  Procedure Laterality Date   ABDOMINAL HYSTERECTOMY     partial   ADENOIDECTOMY     ANAL RECTAL MANOMETRY N/A 04/30/2016   Procedure: ANO RECTAL MANOMETRY;  Surgeon: Gustav Shila GAILS, MD;  Location: [facility];  Service: Endoscopy;  Laterality: N/A;   APPENDECTOMY     BREAST SURGERY Bilateral    benign fatty tumor removed   CARPAL TUNNEL RELEASE Right    CERVICAL SPINE SURGERY     CHOLECYSTECTOMY     COLONOSCOPY     KNEE ARTHROPLASTY Right  LUMBAR DISC SURGERY     MASTECTOMY, PARTIAL     MELANOMA EXCISION     OPERATIVE HYSTEROSCOPY     TONSILLECTOMY      Family History  Problem Relation Age of Onset   Alzheimer's disease Mother    Colon cancer Mother 44   Lung cancer Mother    Dementia Mother    Melanoma Father    Non-Hodgkin's lymphoma Father    Heart disease Maternal Grandmother    Colon cancer Maternal Grandfather    Colon cancer Paternal Grandmother    Colon cancer Paternal Grandfather      Social Connections: Socially Isolated (12/01/2023)   Social  Connection and Isolation Panel    Frequency of Communication with Friends and Family: Never    Frequency of Social Gatherings with Friends and Family: Never    Attends Religious Services: Never    Database Administrator or Organizations: No    Attends Engineer, Structural: Not on file    Marital Status: Married     Current Outpatient Medications  Medication Instructions   acetaminophen  (TYLENOL ) 325 mg, Every 6 hours PRN   ACIDOPHILUS LACTOBACILLUS PO 2 tablets, 3 times daily with meals   ALPRAZolam  (XANAX ) 0.5 mg, Oral, Every 12 hours PRN   aspirin-acetaminophen -caffeine (EXCEDRIN MIGRAINE) 250-250-65 MG tablet 1 tablet, Daily PRN   calcium-vitamin D (OSCAL WITH D) 500-200 MG-UNIT TABS tablet 1 tablet, Every evening   Cholecalciferol (D2000 ULTRA STRENGTH) 50 MCG (2000 UT) CAPS Take by mouth.   cyanocobalamin 1,000 mcg, Daily   cyclobenzaprine  (FLEXERIL ) 10 mg, Oral, 3 times daily PRN   diphenhydrAMINE  (BENADRYL ) 25 mg capsule Take by mouth.   esomeprazole (NEXIUM) 40 MG capsule Take by mouth.   famotidine  (PEPCID ) 20 mg, Oral, Daily at bedtime   fenofibrate  (TRICOR ) 48 mg, Oral, Daily   leflunomide (ARAVA) 10 MG tablet 1 tablet, Daily   lidocaine  (XYLOCAINE ) 2 % jelly 1 application , As needed   Magnesium Oxide 420 MG TABS 1 tablet, Daily   Multiple Vitamin (MULTIVITAMIN WITH MINERALS) TABS tablet 1 tablet, Daily with supper   nystatin (MYCOSTATIN/NYSTOP) powder Apply topically.   ondansetron  (ZOFRAN -ODT) 4 MG disintegrating tablet DISSOLVE ONE TABLET ON TONGUE EVERY 8 HOURS AS NEEDED FOR NAUSEA AND/OR VOMITING   Ozempic  (2 MG/DOSE) 2 mg, Subcutaneous, Weekly   pantoprazole  (PROTONIX ) 40 mg, Daily   topiramate  (TOPAMAX ) 50 mg, Oral, 2 times daily   TURMERIC PO 900 mg, 3 times daily with meals     Physical Exam:   BP 132/89   Pulse 68   SpO2 92%   Salient findings:  CN II-XII intact Bilateral EAC clear and TM intact with well pneumatized middle ear spaces Anterior  rhinoscopy: Septum midline TFL was indicated to better evaluate the proximal airway, given the patient's history and exam findings, and is detailed below. No lesions of oral cavity/oropharynx; dentition - edentulous. Uses dentures secured with posts. I do not see any ulcers today but she still has pain in her mouth around where denture sits. No signs of lichen planus or other oral mucosal pathology  No obviously palpable neck masses/lymphadenopathy/thyromegaly No respiratory distress or stridor  Seprately Identifiable Procedures:  Prior to initiating any procedures, risks/benefits/alternatives were explained to the patient and verbal consent obtained.  Procedure Note (01/27/2024) Pre-procedure diagnosis: throat pain, history of laryngeal candidiasis  Post-procedure diagnosis: Same Procedure: Transnasal Fiberoptic Laryngoscopy, CPT 31575 - Mod 25 Indication: throat pain, history of laryngeal candidiasis  Complications: None apparent EBL: 0  mL  The procedure was undertaken to further evaluate the patient's complaint of throat pain, history of laryngeal candidiasis , with mirror exam inadequate for appropriate examination due to gag reflex and poor patient tolerance  Procedure:  Patient was identified as correct patient. Verbal consent was obtained. The nose was sprayed with oxymetazoline  and 4% lidocaine . The The flexible laryngoscope was passed through the nose to view the nasal cavity, pharynx (oropharynx, hypopharynx) and larynx.  The larynx was examined at rest and during multiple phonatory tasks. Documentation was obtained and reviewed with patient. The scope was removed. The patient tolerated the procedure well.  Findings: The nasal cavity and nasopharynx did not reveal any masses or lesions, mucosa appeared to be without obvious lesions. The tongue base, pharyngeal walls, piriform sinuses, vallecula, epiglottis and postcricoid region are normal in appearance. The visualized portion of the  subglottis and proximal trachea is widely patent. The vocal folds are mobile bilaterally. There are no lesions on the free edge of the vocal folds nor elsewhere in the larynx worrisome for malignancy.    Electronically signed by: Hadassah JAYSON Parody, MD 01/29/2024 9:45 PM   Impression & Plans:  Zori Benbrook is a 78 y.o. female with   1. Recurrent oral ulcers   2. Laryngeal candidiasis   3. 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 Pain inhibits solid food intake. Says the ulcers come and go. They are not present today but I saw small ulcers at her last visit No concern for malignancy given appearance and tendency to come and go, location around dentures  - diflucan  was helpful for this but returned after discontinuing - magic mouthwash has not worked  - refer to dentist to assess denture and fitting - second opinion for any treatments that may be helpful for her symptoms - Advised to call if further evaluation by otolaryngology is needed after dental consultation . Larynx looks healthy today   Laryngeal candidiasis Throat pain  - diflucan  worked while using - TFL today showed no evidence of recurrent candidiasis     See below regarding exact medications prescribed this encounter including dosages and route: No orders of the defined types were placed in this encounter.     Thank you for allowing me the opportunity to care for your patient. Please do not hesitate to contact me should you have any other questions.  Sincerely, Hadassah Parody, MD Otolaryngologist (ENT), [facility] Phone: [phone] Fax: [phone]

## 2024-02-01 NOTE — Progress Notes (Signed)
°   ° °  Chief Complaint  Patient presents with   Callouses    Pt is here to have callous removed from the bottom of both feet.     HPI: 78 y.o. female presents today with concerns of thick, elongated nails that she is not able to trim herself as well as for calluses on the bottoms of her feet.  She also would like to have a steroid injection in the right foot and she points on the dorsal aspect along the third interspace where she gets discomfort.  She send injection previously and done well.  No recent injuries or falls.   Physical Exam:  General: The patient is alert and oriented x3 in no acute distress.  Dermatology: Hyperkeratotic lesion noted right submetatarsal 2, 5 without any underlying ulceration or drainage or signs of infection. Nails are hypertrophic, dystrophic, brittle, discolored, elongated 10. No surrounding redness or drainage. 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 No further open lesions or pre-ulcerative lesions are identified today.   Vascular: Palpable pedal pulses bilaterally. Capillary refill within normal limits.  Decreased pedal hair growth.  Neurological: Light touch sensation grossly intact bilateral feet.  Protective sensation decreased.  Musculoskeletal Exam: Palpation of the third interspace.  There is no palpable neuroma or soft tissue mass identified.  No area of pinpoint tenderness.  Assessment/Plan of Care: Symptomatic onychosis, hyperkeratotic lesions, bursitis  Radiology: X-rays obtained and reviewed with the right foot.  There is no evidence of acute fracture.  Old healed fracture of the third metatarsal.  Significant bunion is present.  Right foot bursitis - She was proceed with steroid injection.  Mixture of 0.5 cc of Marcaine  plain, 0.5 cc of dexamethasone  phosphate was infiltrated into the maximal tenderness along the third interspace but complications.  Postinjection care discussed.   Tolerated well.  Symptomatic onychomycosis -Serpe debrided nails x 9 without any complications or bleeding  Preulcerative hyperkeratotic lesions - Sharp debrided x 2 without any complications or bleeding.  No follow-ups on file.  Donnice JONELLE Fees DPM

## 2024-03-01 ENCOUNTER — Other Ambulatory Visit: Payer: Self-pay | Admitting: Family Medicine

## 2024-03-02 ENCOUNTER — Telehealth: Payer: Self-pay | Admitting: Pharmacist

## 2024-03-02 ENCOUNTER — Other Ambulatory Visit: Admitting: Pharmacist

## 2024-03-02 ENCOUNTER — Other Ambulatory Visit: Payer: Self-pay | Admitting: Pharmacist

## 2024-03-02 NOTE — Telephone Encounter (Signed)
 Attempt was made to contact patient by phone today for follow up by Clinical Pharmacist regarding Ozempic  cost for 2026.  Unable to reach patient. LM on VM with my contact number [phone].

## 2024-03-06 ENCOUNTER — Encounter: Payer: Self-pay | Admitting: Pharmacist

## 2024-03-29 ENCOUNTER — Ambulatory Visit: Admitting: Podiatry

## 2024-03-30 ENCOUNTER — Ambulatory Visit: Admitting: Podiatry

## 2024-03-30 NOTE — Progress Notes (Unsigned)
 Chief Complaint  Patient presents with   [facility]    Patient presents today for routine foot care (RFC) and nail trimming. She reports blisters and callouses that are interfering with her daily activities.   Order u/s right fot

## 2024-06-28 ENCOUNTER — Ambulatory Visit: Admitting: Podiatry
